# Patient Record
Sex: Female | Born: 1990 | Race: Black or African American | Hispanic: No | Smoking: Never smoker
Health system: Southern US, Community
[De-identification: ages and names within clinical notes are randomized; demographics above are authoritative.]

## PROBLEM LIST (undated history)

## (undated) DIAGNOSIS — E079 Disorder of thyroid, unspecified: Secondary | ICD-10-CM

## (undated) DIAGNOSIS — E119 Type 2 diabetes mellitus without complications: Secondary | ICD-10-CM

## (undated) DIAGNOSIS — Z803 Family history of malignant neoplasm of breast: Secondary | ICD-10-CM

## (undated) HISTORY — DX: Family history of malignant neoplasm of breast: Z80.3

---

## 2011-03-14 NOTE — L&D Delivery Note (Signed)
Delivery Note At approximately 1205 pt reported pelvic pressure, RN noted BBOW at introitus- Dr. Penne Lash and I were called to room at which time pt began pushing.  SROM forebag large amount clear fluid with maternal pushing efforts, and at 12:10 PM a living peri-viable female was delivered via  (Presentation: OA ) by Dr. Penne Lash. Cord clamped x 2, cut, and infant taken to warmer for resuscitative efforts by awaiting NICU team.  APGAR: 4/6/7 at 1,5, and 10 min respectively; weight pending at time of note. Infant transported to NICU for continued care.  Placenta status: Delivered spontaneously, intact w/ maternal pushing efforts after pitocin injected directly into the umbilical cord by Dr. Penne Lash. Approximately 3x4cm marginal retroplacental clot noted.  Cord:  3VC, marginal insertion.  Anesthesia:  Epidural Episiotomy: n/a Lacerations: Intact Suture Repair: n/a Est. Blood Loss (mL): 400  Mom to Women's unit.  Baby to NICU. Plans to pump, undecided about contraception.  Marge Duncans 01/27/2012, 12:40 PM

## 2012-01-27 ENCOUNTER — Encounter (HOSPITAL_COMMUNITY): Payer: Self-pay | Admitting: Anesthesiology

## 2012-01-27 ENCOUNTER — Inpatient Hospital Stay (HOSPITAL_COMMUNITY): Payer: Medicaid Other

## 2012-01-27 ENCOUNTER — Inpatient Hospital Stay (HOSPITAL_COMMUNITY): Payer: Medicaid Other | Admitting: Anesthesiology

## 2012-01-27 ENCOUNTER — Encounter (HOSPITAL_COMMUNITY): Payer: Self-pay | Admitting: Emergency Medicine

## 2012-01-27 ENCOUNTER — Inpatient Hospital Stay (HOSPITAL_COMMUNITY)
Admission: EM | Admit: 2012-01-27 | Discharge: 2012-01-29 | DRG: 774 | Disposition: A | Discharge status: Other Acute Inpatient Hospital | Payer: Medicaid Other | Attending: Emergency Medicine | Admitting: Emergency Medicine

## 2012-01-27 DIAGNOSIS — O4690 Antepartum hemorrhage, unspecified, unspecified trimester: Secondary | ICD-10-CM | POA: Diagnosis not present

## 2012-01-27 DIAGNOSIS — O4290 Premature rupture of membranes, unspecified as to length of time between rupture and onset of labor, unspecified weeks of gestation: Secondary | ICD-10-CM

## 2012-01-27 DIAGNOSIS — I10 Essential (primary) hypertension: Secondary | ICD-10-CM

## 2012-01-27 DIAGNOSIS — N939 Abnormal uterine and vaginal bleeding, unspecified: Secondary | ICD-10-CM

## 2012-01-27 LAB — URINALYSIS, MICROSCOPIC ONLY
Leukocytes, UA: NEGATIVE
Nitrite: NEGATIVE
Specific Gravity, Urine: 1.01 (ref 1.005–1.030)
Urobilinogen, UA: 0.2 mg/dL (ref 0.0–1.0)
pH: 6.5 (ref 5.0–8.0)

## 2012-01-27 LAB — DIFFERENTIAL
Lymphs Abs: 1.9 10*3/uL (ref 0.7–4.0)
Monocytes Absolute: 1.3 10*3/uL — ABNORMAL HIGH (ref 0.1–1.0)
Monocytes Relative: 9 % (ref 3–12)
Neutro Abs: 11 10*3/uL — ABNORMAL HIGH (ref 1.7–7.7)
Neutrophils Relative %: 78 % — ABNORMAL HIGH (ref 43–77)

## 2012-01-27 LAB — CBC WITH DIFFERENTIAL/PLATELET
Eosinophils Absolute: 0.1 10*3/uL (ref 0.0–0.7)
Eosinophils Relative: 1 % (ref 0–5)
HCT: 34.3 % — ABNORMAL LOW (ref 36.0–46.0)
Hemoglobin: 11.7 g/dL — ABNORMAL LOW (ref 12.0–15.0)
Lymphs Abs: 2.5 10*3/uL (ref 0.7–4.0)
MCH: 27.8 pg (ref 26.0–34.0)
MCV: 81.5 fL (ref 78.0–100.0)
Monocytes Absolute: 1.4 10*3/uL — ABNORMAL HIGH (ref 0.1–1.0)
Monocytes Relative: 12 % (ref 3–12)
Platelets: 298 10*3/uL (ref 150–400)
RBC: 4.21 MIL/uL (ref 3.87–5.11)

## 2012-01-27 LAB — CBC
HCT: 34.3 % — ABNORMAL LOW (ref 36.0–46.0)
Hemoglobin: 11.7 g/dL — ABNORMAL LOW (ref 12.0–15.0)
RBC: 4.16 MIL/uL (ref 3.87–5.11)

## 2012-01-27 LAB — OB RESULTS CONSOLE GBS: GBS: POSITIVE

## 2012-01-27 LAB — BASIC METABOLIC PANEL
BUN: 7 mg/dL (ref 6–23)
CO2: 25 mEq/L (ref 19–32)
Calcium: 9.6 mg/dL (ref 8.4–10.5)
Creatinine, Ser: 0.68 mg/dL (ref 0.50–1.10)
GFR calc non Af Amer: >90 mL/min (ref >90)
Glucose, Bld: 98 mg/dL (ref 70–99)

## 2012-01-27 LAB — WET PREP, GENITAL: Clue Cells Wet Prep HPF POC: NONE SEEN

## 2012-01-27 LAB — PROTIME-INR: INR: 1 (ref 0.00–1.49)

## 2012-01-27 LAB — APTT: aPTT: 29 seconds (ref 24–37)

## 2012-01-27 LAB — GROUP B STREP BY PCR: Group B strep by PCR: POSITIVE — AB

## 2012-01-27 LAB — RUBELLA SCREEN: Rubella: 121.9 IU/mL — ABNORMAL HIGH

## 2012-01-27 LAB — RPR: RPR Ser Ql: NONREACTIVE

## 2012-01-27 LAB — ABO/RH: ABO/RH(D): B POS

## 2012-01-27 MED ORDER — OXYCODONE-ACETAMINOPHEN 5-325 MG PO TABS
1.0000 | ORAL_TABLET | ORAL | Status: DC | PRN
  Filled 2012-01-27: qty 2

## 2012-01-27 MED ORDER — PHENYLEPHRINE 40 MCG/ML (10ML) SYRINGE FOR IV PUSH (FOR BLOOD PRESSURE SUPPORT): 80.0000 ug | PREFILLED_SYRINGE | INTRAVENOUS | Status: DC | PRN

## 2012-01-27 MED ORDER — PRENATAL MULTIVITAMIN CH: 1.0000 | ORAL_TABLET | Freq: Every day | ORAL | Status: DC

## 2012-01-27 MED ORDER — DIPHENHYDRAMINE HCL 50 MG/ML IJ SOLN: 12.5000 mg | INTRAMUSCULAR | Status: DC | PRN

## 2012-01-27 MED ORDER — FENTANYL CITRATE 0.05 MG/ML IJ SOLN: 100.0000 ug | INTRAMUSCULAR | Status: DC | PRN

## 2012-01-27 MED ORDER — DIBUCAINE 1 % RE OINT: 1.0000 "application " | TOPICAL_OINTMENT | RECTAL | Status: DC | PRN

## 2012-01-27 MED ORDER — SODIUM CHLORIDE 0.9 % IV SOLN: 250.0000 mL | INTRAVENOUS | Status: DC | PRN

## 2012-01-27 MED ORDER — IBUPROFEN 600 MG PO TABS
600.0000 mg | ORAL_TABLET | Freq: Four times a day (QID) | ORAL | Status: DC
  Administered 2012-01-27 – 2012-01-29 (×8): 600 mg via ORAL
  Filled 2012-01-27 (×10): qty 1

## 2012-01-27 MED ORDER — ZOLPIDEM TARTRATE 5 MG PO TABS: 5.0000 mg | ORAL_TABLET | Freq: Every evening | ORAL | Status: DC | PRN

## 2012-01-27 MED ORDER — EPHEDRINE 5 MG/ML INJ: 10.0000 mg | INTRAVENOUS | Status: DC | PRN

## 2012-01-27 MED ORDER — LACTATED RINGERS IV SOLN
INTRAVENOUS | Status: DC
  Administered 2012-01-27: 08:00:00 via INTRAVENOUS

## 2012-01-27 MED ORDER — EPHEDRINE 5 MG/ML INJ
10.0000 mg | INTRAVENOUS | Status: DC | PRN
  Filled 2012-01-27: qty 4

## 2012-01-27 MED ORDER — AMOXICILLIN 500 MG PO CAPS: 500.0000 mg | ORAL_CAPSULE | Freq: Three times a day (TID) | ORAL | Status: DC

## 2012-01-27 MED ORDER — FLEET ENEMA 7-19 GM/118ML RE ENEM: 1.0000 | ENEMA | Freq: Every day | RECTAL | Status: DC | PRN

## 2012-01-27 MED ORDER — ONDANSETRON HCL 4 MG PO TABS: 4.0000 mg | ORAL_TABLET | ORAL | Status: DC | PRN

## 2012-01-27 MED ORDER — MEASLES, MUMPS & RUBELLA VAC ~~LOC~~ INJ: 0.5000 mL | INJECTION | Freq: Once | SUBCUTANEOUS | Status: DC

## 2012-01-27 MED ORDER — FENTANYL 2.5 MCG/ML BUPIVACAINE 1/10 % EPIDURAL INFUSION (WH - ANES)
14.0000 mL/h | INTRAMUSCULAR | Status: DC
  Administered 2012-01-27: 14 mL/h via EPIDURAL
  Filled 2012-01-27: qty 125

## 2012-01-27 MED ORDER — CALCIUM CARBONATE ANTACID 500 MG PO CHEW: 2.0000 | CHEWABLE_TABLET | ORAL | Status: DC | PRN

## 2012-01-27 MED ORDER — AMPICILLIN SODIUM 2 G IJ SOLR
2.0000 g | Freq: Four times a day (QID) | INTRAMUSCULAR | Status: DC
  Filled 2012-01-27 (×4): qty 2000

## 2012-01-27 MED ORDER — SODIUM CHLORIDE 0.9 % IJ SOLN: 3.0000 mL | INTRAMUSCULAR | Status: DC | PRN

## 2012-01-27 MED ORDER — ACETAMINOPHEN 325 MG PO TABS: 650.0000 mg | ORAL_TABLET | ORAL | Status: DC | PRN

## 2012-01-27 MED ORDER — LIDOCAINE HCL (PF) 1 % IJ SOLN
INTRAMUSCULAR | Status: DC | PRN
  Administered 2012-01-27 (×3): 4 mL

## 2012-01-27 MED ORDER — OXYTOCIN 40 UNITS IN LACTATED RINGERS INFUSION - SIMPLE MED: 62.5000 mL/h | INTRAVENOUS | Status: DC | PRN

## 2012-01-27 MED ORDER — OXYTOCIN 10 UNIT/ML IJ SOLN
INTRAMUSCULAR | Status: AC
  Administered 2012-01-27: 10 [IU]
  Filled 2012-01-27: qty 1

## 2012-01-27 MED ORDER — SODIUM CHLORIDE 0.9 % IJ SOLN: 3.0000 mL | Freq: Two times a day (BID) | INTRAMUSCULAR | Status: DC

## 2012-01-27 MED ORDER — TETANUS-DIPHTH-ACELL PERTUSSIS 5-2.5-18.5 LF-MCG/0.5 IM SUSP
0.5000 mL | Freq: Once | INTRAMUSCULAR | Status: AC
  Administered 2012-01-28: 0.5 mL via INTRAMUSCULAR
  Filled 2012-01-27: qty 0.5

## 2012-01-27 MED ORDER — PRENATAL MULTIVITAMIN CH
1.0000 | ORAL_TABLET | Freq: Every day | ORAL | Status: DC
  Administered 2012-01-28 – 2012-01-29 (×2): 1 via ORAL
  Filled 2012-01-27 (×2): qty 1

## 2012-01-27 MED ORDER — DOCUSATE SODIUM 100 MG PO CAPS: 100.0000 mg | ORAL_CAPSULE | Freq: Every day | ORAL | Status: DC

## 2012-01-27 MED ORDER — MAGNESIUM SULFATE 40 G IN LACTATED RINGERS - SIMPLE
2.0000 g/h | INTRAVENOUS | Status: DC
  Filled 2012-01-27: qty 500

## 2012-01-27 MED ORDER — DIPHENHYDRAMINE HCL 25 MG PO CAPS: 25.0000 mg | ORAL_CAPSULE | Freq: Four times a day (QID) | ORAL | Status: DC | PRN

## 2012-01-27 MED ORDER — TERBUTALINE SULFATE 1 MG/ML IJ SOLN: 0.2500 mg | Freq: Once | INTRAMUSCULAR | Status: DC | PRN

## 2012-01-27 MED ORDER — ERYTHROMYCIN BASE 250 MG PO TABS: 250.0000 mg | ORAL_TABLET | Freq: Four times a day (QID) | ORAL | Status: DC

## 2012-01-27 MED ORDER — ONDANSETRON HCL 4 MG/2ML IJ SOLN: 4.0000 mg | INTRAMUSCULAR | Status: DC | PRN

## 2012-01-27 MED ORDER — BISACODYL 10 MG RE SUPP: 10.0000 mg | Freq: Every day | RECTAL | Status: DC | PRN

## 2012-01-27 MED ORDER — BETAMETHASONE SOD PHOS & ACET 6 (3-3) MG/ML IJ SUSP
12.0000 mg | INTRAMUSCULAR | Status: DC
  Administered 2012-01-27: 12 mg via INTRAMUSCULAR
  Filled 2012-01-27 (×2): qty 2

## 2012-01-27 MED ORDER — BENZOCAINE-MENTHOL 20-0.5 % EX AERO: 1.0000 "application " | INHALATION_SPRAY | CUTANEOUS | Status: DC | PRN

## 2012-01-27 MED ORDER — PHENYLEPHRINE 40 MCG/ML (10ML) SYRINGE FOR IV PUSH (FOR BLOOD PRESSURE SUPPORT)
80.0000 ug | PREFILLED_SYRINGE | INTRAVENOUS | Status: DC | PRN
  Filled 2012-01-27: qty 5

## 2012-01-27 MED ORDER — SENNOSIDES-DOCUSATE SODIUM 8.6-50 MG PO TABS
2.0000 | ORAL_TABLET | Freq: Every day | ORAL | Status: DC
  Administered 2012-01-27 – 2012-01-28 (×2): 2 via ORAL

## 2012-01-27 MED ORDER — SODIUM CHLORIDE 0.9 % IV SOLN
2.0000 g | Freq: Once | INTRAVENOUS | Status: AC
  Administered 2012-01-27: 2 g via INTRAVENOUS
  Filled 2012-01-27: qty 2000

## 2012-01-27 MED ORDER — SODIUM CHLORIDE 0.9 % IV SOLN
250.0000 mg | Freq: Four times a day (QID) | INTRAVENOUS | Status: DC
  Filled 2012-01-27 (×4): qty 250

## 2012-01-27 MED ORDER — WITCH HAZEL-GLYCERIN EX PADS: 1.0000 "application " | MEDICATED_PAD | CUTANEOUS | Status: DC | PRN

## 2012-01-27 MED ORDER — MAGNESIUM SULFATE BOLUS VIA INFUSION
4.0000 g | Freq: Once | INTRAVENOUS | Status: AC
  Administered 2012-01-27: 4 g via INTRAVENOUS
  Filled 2012-01-27: qty 500

## 2012-01-27 MED ORDER — LACTATED RINGERS IV SOLN: 500.0000 mL | Freq: Once | INTRAVENOUS | Status: DC

## 2012-01-27 MED ORDER — OXYTOCIN 40 UNITS IN LACTATED RINGERS INFUSION - SIMPLE MED
1.0000 m[IU]/min | INTRAVENOUS | Status: DC
  Administered 2012-01-27: 2 m[IU]/min via INTRAVENOUS
  Filled 2012-01-27: qty 1000

## 2012-01-27 MED ORDER — LANOLIN HYDROUS EX OINT: TOPICAL_OINTMENT | CUTANEOUS | Status: DC | PRN

## 2012-01-27 MED ORDER — SIMETHICONE 80 MG PO CHEW: 80.0000 mg | CHEWABLE_TABLET | ORAL | Status: DC | PRN

## 2012-01-27 NOTE — Anesthesia Preprocedure Evaluation (Signed)
Anesthesia Evaluation  Patient identified by MRN, date of birth, ID band Patient awake    Reviewed: Allergy & Precautions, H&P , NPO status , Patient's Chart, lab work & pertinent test results, reviewed documented beta blocker date and time   History of Anesthesia Complications Negative for: history of anesthetic complications  Airway Mallampati: I TM Distance: >3 FB Neck ROM: full    Dental  (+) Teeth Intact   Pulmonary neg pulmonary ROS,  breath sounds clear to auscultation        Cardiovascular negative cardio ROS  Rhythm:regular Rate:Normal     Neuro/Psych negative neurological ROS  negative psych ROS   GI/Hepatic negative GI ROS, Neg liver ROS,   Endo/Other  Morbid obesity  Renal/GU negative Renal ROS     Musculoskeletal   Abdominal   Peds  Hematology negative hematology ROS (+)   Anesthesia Other Findings   Reproductive/Obstetrics (+) Pregnancy (23 weeks, PPROM, abruption)                           Anesthesia Physical Anesthesia Plan  ASA: III  Anesthesia Plan: Epidural   Post-op Pain Management:    Induction:   Airway Management Planned:   Additional Equipment:   Intra-op Plan:   Post-operative Plan:   Informed Consent: I have reviewed the patients History and Physical, chart, labs and discussed the procedure including the risks, benefits and alternatives for the proposed anesthesia with the patient or authorized representative who has indicated his/her understanding and acceptance.     Plan Discussed with:   Anesthesia Plan Comments:         Anesthesia Quick Evaluation

## 2012-01-27 NOTE — Anesthesia Procedure Notes (Signed)
Epidural Patient location during procedure: OB Start time: 01/27/2012 11:21 AM  Staffing Performed by: anesthesiologist   Preanesthetic Checklist Completed: patient identified, site marked, surgical consent, pre-op evaluation, timeout performed, IV checked, risks and benefits discussed and monitors and equipment checked  Epidural Patient position: sitting Prep: site prepped and draped and DuraPrep Patient monitoring: continuous pulse ox and blood pressure Approach: midline Injection technique: LOR air  Needle:  Needle type: Tuohy  Needle gauge: 17 G Needle length: 9 cm and 9 Needle insertion depth: 8 cm Catheter type: closed end flexible Catheter size: 19 Gauge Catheter at skin depth: 14 cm Test dose: negative  Assessment Events: blood not aspirated, injection not painful, no injection resistance, negative IV test and no paresthesia  Additional Notes Discussed risk of headache, infection, bleeding, nerve injury and failed or incomplete block.  Patient voices understanding and wishes to proceed. Reason for block:procedure for pain

## 2012-01-27 NOTE — Consult Note (Signed)
Neonatology Consult to Antenatal Patient:  [name] is admitted this morning at 23 2/[redacted] weeks GA with a bulging amniotic sac and some uterine contractions.  She is getting BMZ, magnesium sulfate, and IV Ampicillin. Ultrasound shows an abruption and MFM is recommending induction.  I spoke with the patient and the father of the baby. We discussed the worst case of delivery in the next 1-2 days, including usual DR management, respiratory complications and need for support, IV access, LOS, Mortality and Morbidity, and long term outcomes. Given the very poor prognosis at this GA, especially without time for Betamethasone to have effect, the parents, in consultation with Dr. Penne Lash, may decide to forgo resuscitation attempts. However, if they opt to attempt resuscitation, I have advised them of the possibility that the baby may not respond to resuscitative efforts. Still, we will resuscitate if the parents request it. The couple did not have any questions at this time. We would be glad to come back if they has more questions later.  Thank you for asking me to see this patient.  Deatra James, MD Neonatologist  Time spent: 20 min

## 2012-01-27 NOTE — ED Notes (Signed)
Pt started having a cramping yesterday at 1600. Pt then started bleeding this morning

## 2012-01-27 NOTE — ED Notes (Signed)
Ob rapid response called  

## 2012-01-27 NOTE — ED Notes (Signed)
Pt transferred to [facility] via [facility]. Pt alert and oriented upon discharge from ED

## 2012-01-27 NOTE — Progress Notes (Signed)
Patient ID: Katie [name], female   DOB: Sep 20, 1990, 21 y.o.   MRN: [id number]   Subjective:  Called to bedside for heavy vaginal bleeding pooling on the bed.  Pt not complaining of abdominal pain, lightheadedness  Objective: Filed Vitals:   01/27/12 0922 01/27/12 0931 01/27/12 0942 01/27/12 0956  BP: 115/49 127/63 121/66 160/71  Pulse: 103 101 107 108  Temp:      TempSrc:      Resp:      Height:      Weight:      SpO2:        Abdomen:  Soft, NT Vagina / Cervix:  Clots and blood in bed (approx 200 cc); and continues to trickle.  No presenting part; Bag bulging. Ext:  NT  Rpt bedside US:  Vertex, no clot  MFM consult done via phone  Given amount of bleeding and painful contractions delivery is probably close.  Also, full course of Betamethasone has not been received so likelihood of infant surviving is low.  NICU has given option of not resuscitating.  Discussed option of inducing and c/s fetal distress.  After long conversations about future fertility, cerclage with next pregnancy, risk of cesarean section, patient has decided to augment labor but not do cesarean section due to risks for her.  Patient would like an epidural now.  Pt would like NICU to be present for resuscitation.

## 2012-01-27 NOTE — ED Provider Notes (Signed)
History     CSN: [id number]  Arrival date & time 01/27/12  0500   First MD Initiated Contact with Patient 01/27/12 [phone]      Chief Complaint  Patient presents with  . Vaginal Bleeding  . Abdominal Pain    (Consider location/radiation/quality/duration/timing/severity/associated sxs/prior treatment) HPI Comments: 21 yeatr old female who presents with vaginal bleeding. She is G1 P0 at [redacted] weeks gestation with an uncomplicated pregnancy. She states that approximately 13 hours ago she started to have abdominal cramping which she felt was similar to menstrual cramps and approximately several hours ago started to have vaginal bleeding which was full clots, bright red blood and was brisk. Her symptoms are persistent, nothing makes better or worse, not associated with diarrhea, dysuria, fevers or chills. She has had her normal OB/GYN care in [facility].  Patient is a 21 y.o. female presenting with vaginal bleeding and abdominal pain. The history is provided by the patient.  Vaginal Bleeding Associated symptoms include abdominal pain.  Abdominal Pain The primary symptoms of the illness include abdominal pain and vaginal bleeding.    History reviewed. No pertinent past medical history.  History reviewed. No pertinent past surgical history.  History reviewed. No pertinent family history.  History  Substance Use Topics  . Smoking status: Not on file  . Smokeless tobacco: Not on file  . Alcohol Use: Not on file    OB History    Grav Para Term Preterm Abortions TAB SAB Ect Mult Living                  Review of Systems  Gastrointestinal: Positive for abdominal pain.  Genitourinary: Positive for vaginal bleeding.  All other systems reviewed and are negative.    Allergies  Review of patient's allergies indicates no known allergies.  Home Medications   Current Outpatient Rx  Name  Route  Sig  Dispense  Refill  . PRENATAL MULTIVITAMIN CH   Oral   Take 1  tablet by mouth daily.           BP 158/80  Pulse 108  Temp 98.6 F (37 C) (Oral)  Resp 20  Ht 5\' 2"  (1.575 m)  Wt 247 lb (112.038 kg)  BMI 45.18 kg/m2  SpO2 100%  LMP 08/27/2011  Physical Exam  Nursing note and vitals reviewed. Constitutional: She appears well-developed and well-nourished. No distress.  HENT:  Head: Normocephalic and atraumatic.  Mouth/Throat: Oropharynx is clear and moist. No oropharyngeal exudate.  Eyes: Conjunctivae normal and EOM are normal. Pupils are equal, round, and reactive to light. Right eye exhibits no discharge. Left eye exhibits no discharge. No scleral icterus.  Neck: Normal range of motion. Neck supple. No JVD present. No thyromegaly present.  Cardiovascular: Regular rhythm, normal heart sounds and intact distal pulses.  Exam reveals no gallop and no friction rub.   No murmur heard.      Mild tachycardia  Pulmonary/Chest: Effort normal and breath sounds normal. No respiratory distress. She has no wheezes. She has no rales.  Abdominal: Soft. Bowel sounds are normal. She exhibits no distension and no mass. There is tenderness (mild tenderness to palpation in the suprapubic area).  Genitourinary:       Bimanual exam with chaperone present shows large amount of bright red blood, closed normal appearing cervix, no tenderness  Musculoskeletal: Normal range of motion. She exhibits no edema and no tenderness.  Lymphadenopathy:    She has no cervical adenopathy.  Neurological: She is alert. Coordination  normal.  Skin: Skin is warm and dry. No rash noted. No erythema.  Psychiatric: She has a normal mood and affect. Her behavior is normal.    ED Course  Procedures (including critical care time)   Labs Reviewed  CBC WITH DIFFERENTIAL  BASIC METABOLIC PANEL  ABO/RH   No results found.   1. Vaginal bleeding   2. Hypertension       MDM  Overall the patient appears well though she does have lower abdominal mild tenderness and a large amount  of vaginal bleeding. Concern for presence of placenta near cervix, she needs OB/GYN evaluation, I have discussed her care with Dr. Emelda Fear who has accepted transfer of the [facility] the [facility] hospital. Transfer orders made, blood pressure slightly elevated, mild tachycardia on vital signs, no hypotension. No seizures.        Vida Roller, MD 01/27/12 [phone]

## 2012-01-27 NOTE — MAU Note (Signed)
Dr Thad Ranger called to come evaluate pt. Will come as soon as poss

## 2012-01-27 NOTE — H&P (Signed)
Katie [name] is a 21 y.o. female presenting for cramping, bleeding  Pt is a 21 y.o. G1P0 at [redacted]w[redacted]d by LMP with cramping lower abdominal pain starting 14 hours ago, intermittent, every 2 to 3 minutes. Pain 8/10. Noticed bleeding in toilet - small clot - early this AM, pink/red on toilet paper and then dripping of blood into toilet. Has had one pad half-saturated in MAU. Transferred from [facility]   Maternal Medical History:  Reason for admission: Reason for admission: contractions and vaginal bleeding.  Reason for Admission:   nauseaContractions: Onset was 13-24 hours ago.   Frequency: regular.   Perceived severity is moderate.    Prenatal Complications - Diabetes: none.    OB History    Grav Para Term Preterm Abortions TAB SAB Ect Mult Living   1              History reviewed. No pertinent past medical history. History reviewed. No pertinent past surgical history. Family History: family history is not on file. Social History:  does not have a smoking history on file. She does not have any smokeless tobacco history on file. Her alcohol and drug histories not on file.   Prenatal Transfer Tool  Maternal Diabetes: No Genetic Screening: Unknown Maternal Ultrasounds/Referrals: Normal Fetal Ultrasounds or other Referrals:  None Maternal Substance Abuse:  No Significant Maternal Medications:  None Significant Maternal Lab Results:  None Other Comments:  Patient gets care at [facility] in [place]. No records available yet.  Review of Systems  Constitutional: Negative for fever and chills.  Eyes: Negative for blurred vision and double vision.  Gastrointestinal: Positive for abdominal pain. Negative for nausea and vomiting.  Genitourinary: Negative for dysuria and urgency.  Neurological: Negative for dizziness and headaches.      Blood pressure 130/63, pulse 80, temperature 99 F (37.2 C), temperature source Oral, resp. rate 20, height 5\' 2"  (1.575 m), weight 112.038 kg (247 lb),  last menstrual period 08/27/2011, SpO2 100.00%. Maternal Exam:  Cervix: Cervix evaluated by sterile speculum exam.     Physical Exam  Constitutional: She is oriented to person, place, and time. She appears well-developed and well-nourished. She appears distressed (anxious).  HENT:  Head: Normocephalic and atraumatic.  Eyes: Conjunctivae normal and EOM are normal.  Neck: Normal range of motion. Neck supple.  Cardiovascular: Normal rate, regular rhythm and normal heart sounds.   Respiratory: Effort normal and breath sounds normal. No respiratory distress.  GI: Soft. There is no tenderness. There is no rebound and no guarding.  Genitourinary:       Normal external genitalia with blood around introitus and on pad. Vagina normal, some thin blood-tinged fluid. Membranes visible at os, looks about 5 cm visually. No pelvic/digital exam done.  Musculoskeletal: Normal range of motion. She exhibits no edema and no tenderness.  Neurological: She is alert and oriented to person, place, and time.  Skin: Skin is warm and dry.  Psychiatric: She has a normal mood and affect.    Results for orders placed during the hospital encounter of 01/27/12 (from the past 24 hour(s))  CBC WITH DIFFERENTIAL     Status: Abnormal   Collection Time   01/27/12  5:17 AM      Component Value Range   WBC 12.1 (*) 4.0 - 10.5 K/uL   RBC 4.21  3.87 - 5.11 MIL/uL   Hemoglobin 11.7 (*) 12.0 - 15.0 g/dL   HCT 78.2 (*) 95.6 - 21.3 %   MCV 81.5  78.0 - 100.0 fL  MCH 27.8  26.0 - 34.0 pg   MCHC 34.1  30.0 - 36.0 g/dL   RDW 09.8  11.9 - 14.7 %   Platelets 298  150 - 400 K/uL   Neutrophils Relative 67  43 - 77 %   Neutro Abs 8.1 (*) 1.7 - 7.7 K/uL   Lymphocytes Relative 21  12 - 46 %   Lymphs Abs 2.5  0.7 - 4.0 K/uL   Monocytes Relative 12  3 - 12 %   Monocytes Absolute 1.4 (*) 0.1 - 1.0 K/uL   Eosinophils Relative 1  0 - 5 %   Eosinophils Absolute 0.1  0.0 - 0.7 K/uL   Basophils Relative 0  0 - 1 %   Basophils Absolute  0.0  0.0 - 0.1 K/uL  BASIC METABOLIC PANEL     Status: Normal   Collection Time   01/27/12  5:17 AM      Component Value Range   Sodium 136  135 - 145 mEq/L   Potassium 3.7  3.5 - 5.1 mEq/L   Chloride 103  96 - 112 mEq/L   CO2 25  19 - 32 mEq/L   Glucose, Bld 98  70 - 99 mg/dL   BUN 7  6 - 23 mg/dL   Creatinine, Ser 8.29  0.50 - 1.10 mg/dL   Calcium 9.6  8.4 - 56.2 mg/dL   GFR calc non Af Amer >90  >90 mL/min   GFR calc Af Amer >90  >90 mL/min  ABO/RH     Status: Normal   Collection Time   01/27/12  5:20 AM      Component Value Range   ABO/RH(D) B POS     No rh immune globuloin NOT A RH IMMUNE GLOBULIN CANDIDATE, PT RH POSITIVE    WET PREP, GENITAL     Status: Abnormal   Collection Time   01/27/12  6:50 AM      Component Value Range   Yeast Wet Prep HPF POC NONE SEEN  NONE SEEN   Trich, Wet Prep NONE SEEN  NONE SEEN   Clue Cells Wet Prep HPF POC NONE SEEN  NONE SEEN   WBC, Wet Prep HPF POC FEW (*) NONE SEEN   FHTs:  140, mod variability, accels present, variable decels - some short but 1 down to 60s lasting for 1 minute. Toco:  Not picking up contractions well, but irritability and some subtle ctx q 3-4 min.  Prenatal labs: ABO, Rh: --/--/B POS (11/16 0520) Antibody:   Rubella:   RPR:    HBsAg:    HIV:    GBS:     Assessment/Plan: 21 y.o. G1P0 at [redacted]w[redacted]d by LMP with -  PPROM, preterm contractions/cramping  -  BMZ, latency antibiotics -  Ultrasound, OB labs, GBS, GC/Chla, wet prep -  Admit to antenatal, continuous monitoring - NICU consult. Pt wants to be transferred to [facility] which is closer to family. Will discuss if/when stable. - Discussed with NICU and Dr. Emelda Fear.  [name] 01/27/2012, 7:19 AM

## 2012-01-27 NOTE — MAU Note (Signed)
Pt received per [facility] from [place]-presented there with craming and vaginal bleeding-states gets her care at [facility] in [place]

## 2012-01-28 ENCOUNTER — Encounter (HOSPITAL_COMMUNITY): Payer: Self-pay | Admitting: *Deleted

## 2012-01-28 LAB — URINE CULTURE
Colony Count: NO GROWTH
Culture: NO GROWTH

## 2012-01-28 LAB — CBC
HCT: 30.4 % — ABNORMAL LOW (ref 36.0–46.0)
MCV: 82.2 fL (ref 78.0–100.0)
RDW: 13.6 % (ref 11.5–15.5)
WBC: 27.7 10*3/uL — ABNORMAL HIGH (ref 4.0–10.5)

## 2012-01-28 NOTE — Progress Notes (Signed)
Post Partum Day 1 Subjective: up ad lib, voiding, tolerating PO and + flatus  Objective: Blood pressure 120/67, pulse 70, temperature 97.9 F (36.6 C), temperature source Oral, resp. rate 18, height 5\' 2"  (1.575 m), weight 112.038 kg (247 lb), last menstrual period 08/27/2011, SpO2 100.00%.  Physical Exam:  General: alert, cooperative and no distress Lochia: appropriate, pt states she passed something that looked like tissue this morning, some bad smell Uterine Fundus: firm, non-tender Incision: n/a DVT Evaluation: No evidence of DVT seen on physical exam. Negative Homan's sign. No cords or calf tenderness.   Basename 01/28/12 0506 01/27/12 0734  HGB 10.3* 11.7*  HCT 30.4* 34.3*   Results for orders placed during the hospital encounter of 01/27/12 (from the past 24 hour(s))  CBC     Status: Abnormal   Collection Time   01/28/12  5:06 AM      Component Value Range   WBC 27.7 (*) 4.0 - 10.5 K/uL   RBC 3.70 (*) 3.87 - 5.11 MIL/uL   Hemoglobin 10.3 (*) 12.0 - 15.0 g/dL   HCT 16.1 (*) 09.6 - 04.5 %   MCV 82.2  78.0 - 100.0 fL   MCH 27.8  26.0 - 34.0 pg   MCHC 33.9  30.0 - 36.0 g/dL   RDW 40.9  81.1 - 91.4 %   Platelets 295  150 - 400 K/uL    Assessment/Plan: Plan for discharge tomorrow Baby in NICU Elevated WBC  27.7, afebrile.  Repeat CBC with diff tomorrow.   LOS: 1 day   [name] 01/28/2012, 11:32 AM

## 2012-01-28 NOTE — Clinical Social Work Note (Signed)
Clinical Social Work Department PSYCHOSOCIAL ASSESSMENT - MATERNAL/CHILD 01/28/2012  Patient:  Katie [name], Katie [name]  Account Number:  [id number]  Admit Date:  01/27/2012  Marjo Bicker Name:   Vickki Muff    Clinical Social Worker:  Truman Hayward, LCSW   Date/Time:  01/28/2012 10:00 AM  Date Referred:  01/28/2012   Referral source  RN     Referred reason  NICU   Other referral source:    I:  FAMILY / HOME ENVIRONMENT Child's legal guardian:  PARENT  Guardian - Name Guardian - Age Guardian - Address  Katie [name] [address] [place], [place] [address]  Katie [name]  [address] [place], [place] [address]   Other household support members/support persons Name Relationship DOB  none     Other support:   MOB and FOB report good family support, however most of supportive family lives in [place].    II  PSYCHOSOCIAL DATA Information Source:  Patient Interview  Event organiser Employment:   both MOB and FOB are currently unemployed   Surveyor, quantity resources:  Media planner If [employer] - County:  [employer] / Grade:   Maternity Care Coordinator / Child Services Coordination / Early Interventions:  Cultural issues impacting care:    III  STRENGTHS Strengths  Adequate Resources  Supportive family/friends  Home prepared for Child (including basic supplies)  Compliance with medical plan  Understanding of illness   Strength comment:    IV  RISK FACTORS AND CURRENT PROBLEMS Current Problem:  None   Risk Factor & Current Problem Patient Issue Family Issue Risk Factor / Current Problem Comment   N N     V  SOCIAL WORK ASSESSMENT CSW spoke with MOB and FOB, along with other family in room.  CSW discussed admission to NICU.  MOB and  FOB reported they understood treatment and felt there was good communication with staff.  CSW discussed emotional stability.  MOB and FOB expressed there are no major symptom  concerns.  CSW discussed PPD symptoms and what to look out for with MOB.  CSW discussed supplies and family support.  MOB reported due to early delivery they had several supplies to still get for infant.  CSW instructed MOB to let SW know if any concerns arise.  MOB reported for family support that they plan to transfer infant, once stable, to [place] for further treatment due to more family in [place] area.  CSW discussed infant qualifying for SSI and documented information to begin application process.  CSW will continue to follow to offer support while infant in NICU.      VI SOCIAL WORK PLAN Social Work Plan  Psychosocial Support/Ongoing Assessment of Needs   Type of pt/family education:   information about PPD.   If child protective services report - county:   If child protective services report - date:   Information/referral to community resources comment:   Other social work plan:

## 2012-01-28 NOTE — Anesthesia Postprocedure Evaluation (Signed)
  Anesthesia Post-op Note  Patient: Katie [name]  Procedure(s) Performed: * No procedures listed *  Patient Location: Women's Unit  Anesthesia Type:Epidural  Level of Consciousness: awake, alert  and oriented  Airway and Oxygen Therapy: Patient Spontanous Breathing  Post-op Pain: mild  Post-op Assessment: Patient's Cardiovascular Status Stable, Respiratory Function Stable, Patent Airway, No signs of Nausea or vomiting and Pain level controlled  Post-op Vital Signs: stable  Complications: No apparent anesthesia complications

## 2012-01-29 LAB — GC/CHLAMYDIA PROBE AMP, GENITAL: Chlamydia, DNA Probe: NEGATIVE

## 2012-01-29 LAB — CBC WITH DIFFERENTIAL/PLATELET
Basophils Relative: 0 % (ref 0–1)
Eosinophils Absolute: 0.1 10*3/uL (ref 0.0–0.7)
Hemoglobin: 10.2 g/dL — ABNORMAL LOW (ref 12.0–15.0)
MCH: 27.7 pg (ref 26.0–34.0)
MCHC: 32.9 g/dL (ref 30.0–36.0)
Monocytes Absolute: 1.6 10*3/uL — ABNORMAL HIGH (ref 0.1–1.0)
Monocytes Relative: 9 % (ref 3–12)
Neutrophils Relative %: 65 % (ref 43–77)

## 2012-01-29 MED ORDER — IBUPROFEN 600 MG PO TABS: 600.0000 mg | ORAL_TABLET | Freq: Four times a day (QID) | ORAL | Status: DC

## 2012-01-29 MED ORDER — NORETHINDRONE 0.35 MG PO TABS: ORAL_TABLET | ORAL | Status: DC

## 2012-01-29 NOTE — Progress Notes (Signed)
Ur chart review completed.  

## 2012-01-29 NOTE — Discharge Summary (Signed)
Obstetric Discharge Summary Reason for Admission: onset of labor- pt presented with cramping and passing a clot; cx was visually 5cm dilated with membranes present at approx 7a and proceeded to delivery at approx noon; received one dose BMZ  Prenatal Procedures: none Intrapartum Procedures: spontaneous vaginal delivery Postpartum Procedures: none Complications-Operative and Postpartum: none Hemoglobin  Date Value Range Status  01/29/2012 10.2* 12.0 - 15.0 g/dL Final     HCT  Date Value Range Status  01/29/2012 31.0* 36.0 - 46.0 % Final    Physical Exam:  General: alert, cooperative and no distress Lochia: appropriate Uterine Fundus: firm DVT Evaluation: No evidence of DVT seen on physical exam.  Discharge Diagnoses: IUP at 23.2 wks- delivery  Discharge Information: Date: 01/29/2012 Activity: pelvic rest Diet: routine Medications: PNV and Ibuprofen Condition: stable Instructions: refer to practice specific booklet Discharge to: home Follow-up Information    Follow up with Provider in [place], [place]. (Make appointment for 4-6 weeks postpartum)          Newborn Data: Live born female  Birth Weight: 1 lb 1.3 oz (490 g) APGAR: 4, 6  Infant in NICU. Pt pumping breastmilk.  Pt moving to [place], [place] within the next few weeks- will follow up for postpartum visit there.  Cam Hai 01/29/2012, 7:51 AM

## 2012-01-29 NOTE — Progress Notes (Signed)
CSW met with parents in MOB's third floor room to introduce myself as they initially met with weekend CSW. CSW also assisted them in completing SSI application. Parents were very friendly and state baby is doing well today. MOB explained to CSW that they are having to move out of their apartment because FOB lost his job and they are "behind" financially. They are moving to [place], [place] (near [place]) on Friday. They had planned to transfer PNC to [place] and then the baby came before the move. They appear to be coping well with added stress to the situation. They do not have transportation in order to stay in [place], but CSW informed them of the possibility of staying at [facility]. CSW also offered gas cards if they are traveling from [place], although resources for this are limited. They told CSW that they will let CSW know if assistance is needed. CSW also addressed signs and symptoms of PPD and common emotions related to the NICU experience. Both parents were very attentive and know the importance of asking for help if needed.  

## 2012-01-31 LAB — TYPE AND SCREEN: Unit division: 0

## 2013-12-07 IMAGING — US US OB COMP +14 WK
1 series · 12 of 28 positions shown · non-contrast
Comparison: none

[Series 1: us ob comp +14 wk · 73 acquisitions, 12 frames shown]
[im 3/73]
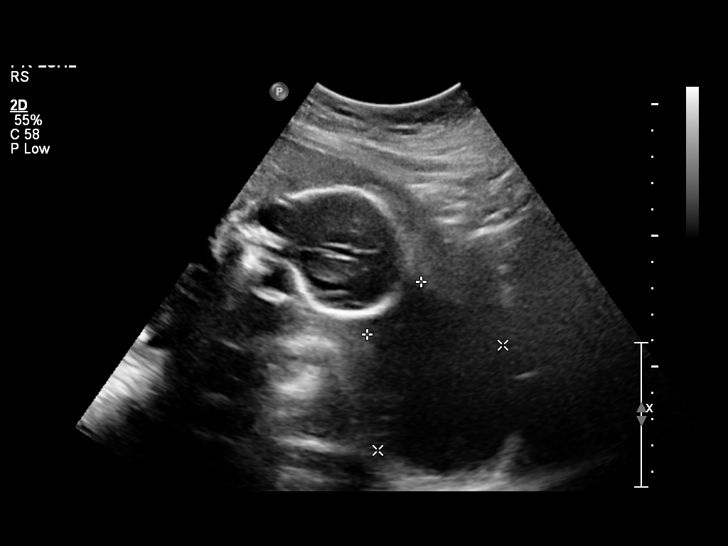
[im 9/73]
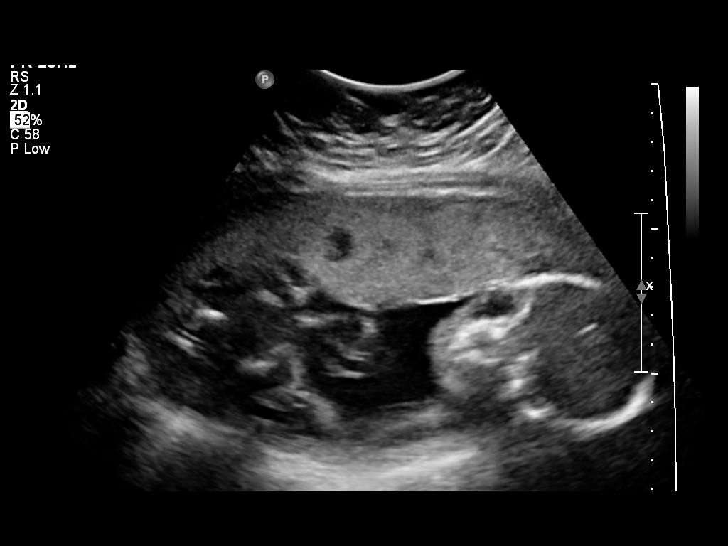
[im 14/73]
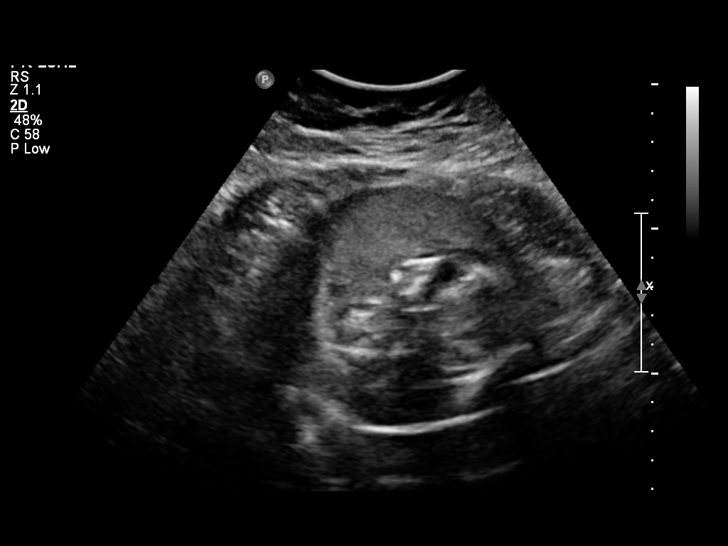
[im 22/73]
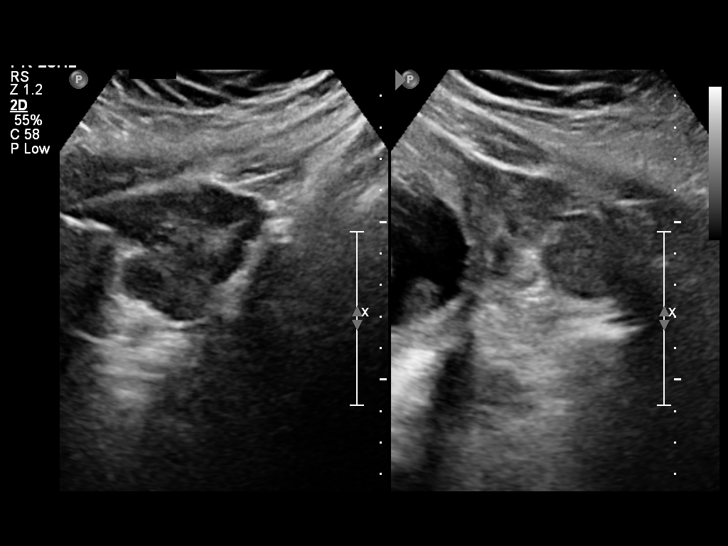
[im 27/73]
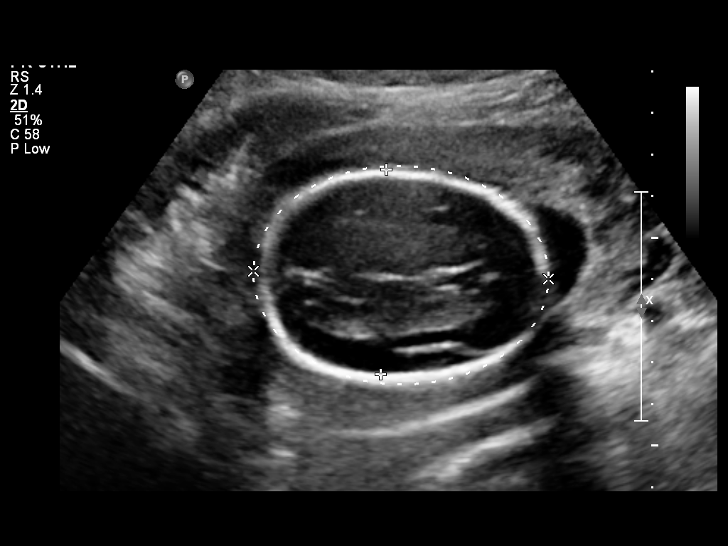
[im 33/73]
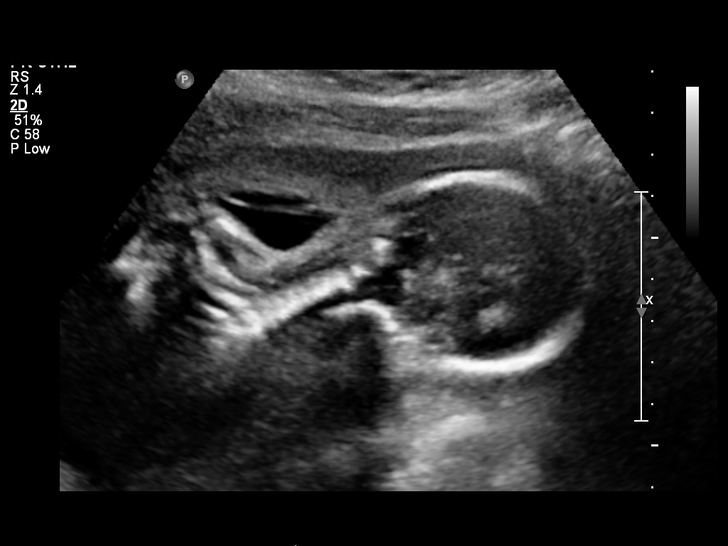
[im 41/73]
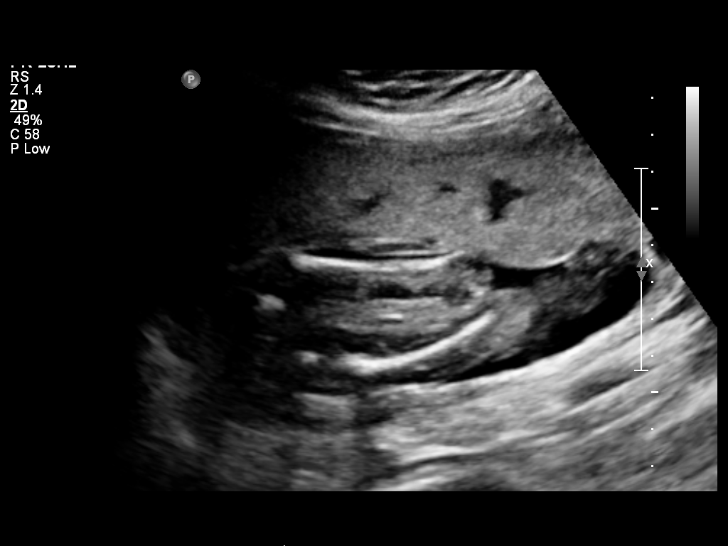
[im 46/73]
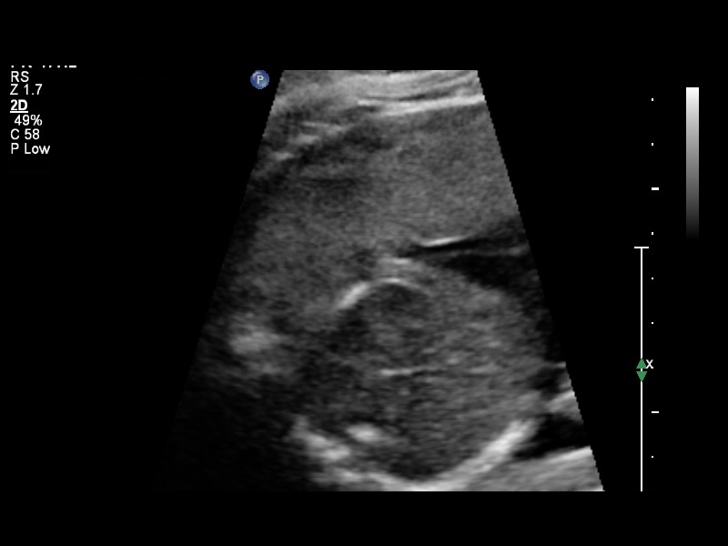
[im 51/73]
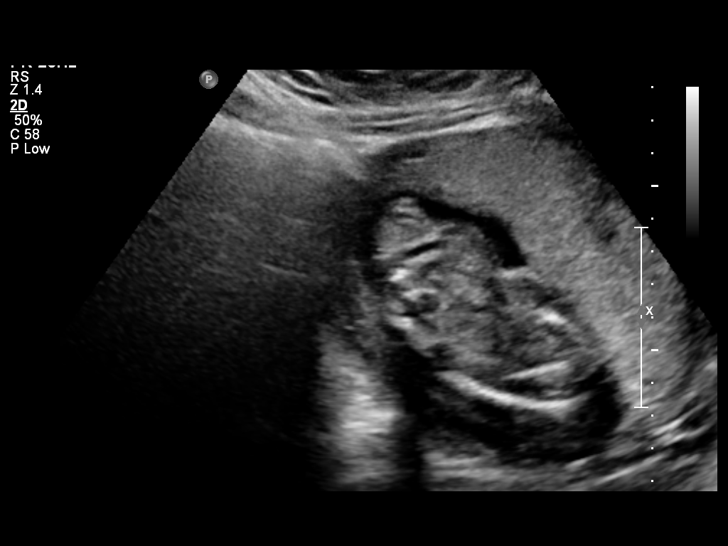
[im 59/73]
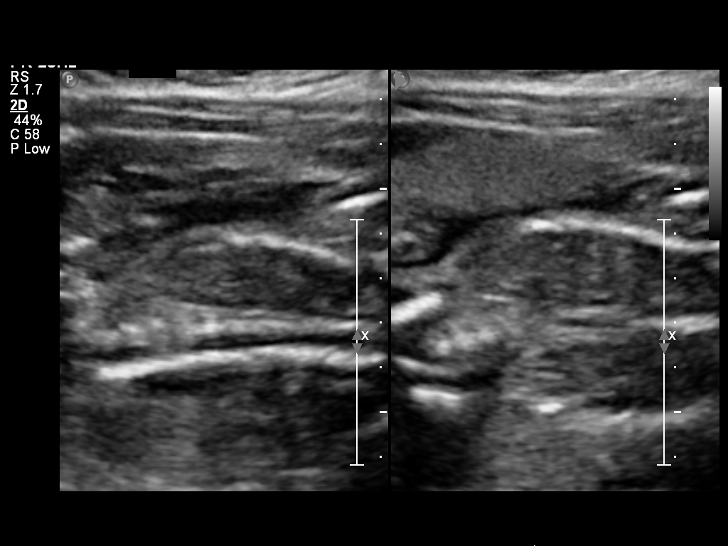
[im 65/73]
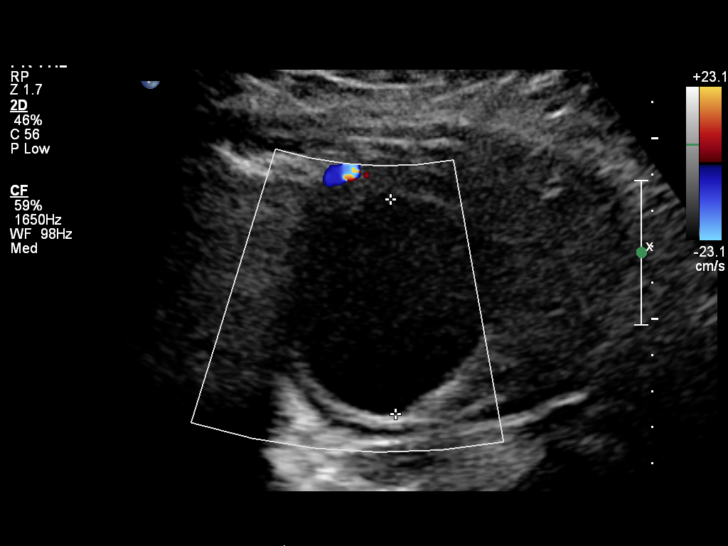
[im 70/73]
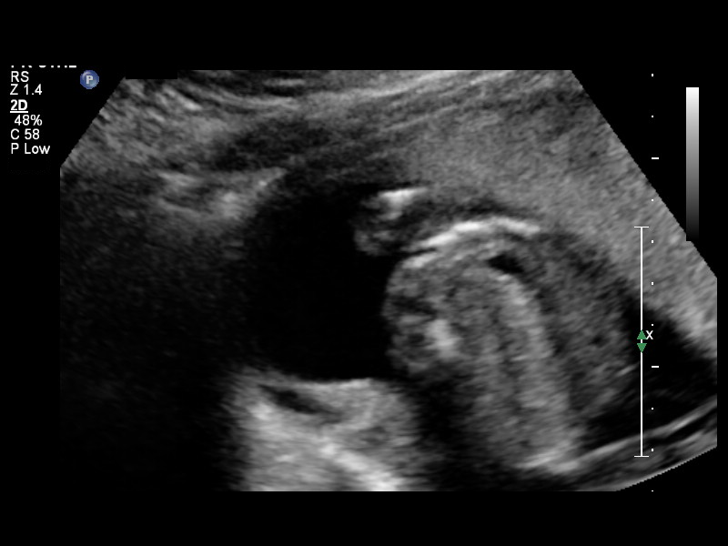

[12 of 28 positions shown; findings below may reference images not displayed]

OBSTETRICS REPORT
                      (Signed Final 01/29/2012 [DATE])

Service(s) Provided

 US OB COMP + 14 WK                                    76805.1
Indications

 Premature rupture of membranes - leaking fluid
 (PPROM)
 Basic anatomic survey
 Vaginal bleeding, unknown etiology
 Evaluate placenta
 Assess fetal growth
 Determine fetal presentation using ultrasound
 Cervical length
Fetal Evaluation

 Num Of Fetuses:    1
 Fetal Heart Rate:  144                         bpm
 Cardiac Activity:  Observed
 Presentation:      Cephalic
 Placenta:          Anterior, above cervical os
 P. Cord            Visualized
 Insertion:

 Amniotic Fluid
 AFI FV:      Subjectively within normal limits
                                             Larg Pckt:     5.9  cm
Biometry

 BPD:     48.7  mm    G. Age:   20w 5d                CI:         64.5   70 - 86
                                                      FL/HC:      20.6   19.2 -

 HC:       195  mm    G. Age:   21w 5d      < 3  %    HC/AC:      1.07   1.05 -

 AC:     182.6  mm    G. Age:   23w 0d       36  %    FL/BPD:     82.3   71 - 87
 FL:      40.1  mm    G. Age:   23w 0d       28  %    FL/AC:      22.0   20 - 24
 CER:     24.8  mm    G. Age:   22w 6d       42  %

 Est. FW:     535  gm      1 lb 3 oz     42  %
Gestational Age

 Clinical EDD:  23w 2d                                        EDD:   05/23/12
 U/S Today:     22w 1d                                        EDD:   05/31/12
 Best:          23w 2d    Det. By:   Clinical EDD             EDD:   05/23/12
Anatomy

 Cranium:          Appears normal         Ductal Arch:      Basic anatomy
                                                            exam per order
 Fetal Cavum:      Appears normal         Diaphragm:        Basic anatomy
                                                            exam per order
 Ventricles:       Appears normal         Stomach:          Appears normal, left
                                                            sided
 Choroid Plexus:   Appears normal         Abdomen:          Appears normal
 Cerebellum:       Appears normal         Abdominal Wall:   Not well visualized
 Posterior Fossa:  Appears normal         Cord Vessels:     2 Vessel Cord
 Nuchal Fold:      Not applicable (>20    Kidneys:          Appear normal
                   wks GA)
 Lips:             Appears normal         Bladder:          Appears normal
 Heart:            Echogenic focus        Spine:            Not well visualized
                   in LV
 RVOT:             Not well visualized    Lower             Visualized
                                          Extremities:
 LVOT:             Not well visualized    Upper             Visualized
                                          Extremities:
 Aortic Arch:      Basic anatomy
                   exam per order

 Other:  Complete fetal anatomic survey previously performed in office.
         Technically difficult due to fetal position.
Targeted Anatomy

 Fetal Central Nervous System
 Lat. Ventricles:  5.8                    Cisterna Magna:
Cervix Uterus Adnexa

 Cervical Length:   0         cm

 Cervix:       Open.  Bulging membranes into the vagina.
               External os appears to be dilated 3.1 cm.

 Left Ovary:   Size(cm) L: 4.31 x W: 2.79 x H: 2.94  Volume(cc):

 Right Ovary:  Size(cm) L: 4.29 x W: 2.68 x H: 2.78  Volume(cc):


 Adnexa:     No abnormality visualized.
Comments

 Gave Isla, RN verbal preliminary at time of Ultrasound.
 Preliminary faxed to L&D unit.
Impression

 Assigned GA is currently 23w 2d by established clinical EDD.
 Concordant fetal biometry on today's exam.
 Open cervix with bulging membranes into vagina.
 Suboptimal visualization of fetal anatomy.  2 vessel cord
 noted.  No other anomalies seen involving visualized
 anatomy.
 Echogenic intracardiac focus noted, which is a soft marker for
 Trisomy 21.
 questions or concerns.

## 2014-01-12 ENCOUNTER — Encounter (HOSPITAL_COMMUNITY): Payer: Self-pay | Admitting: *Deleted

## 2016-10-27 LAB — HM PAP SMEAR: HM Pap smear: NORMAL

## 2017-05-14 ENCOUNTER — Encounter: Payer: Self-pay | Admitting: Physician Assistant

## 2017-05-14 ENCOUNTER — Ambulatory Visit: Payer: Managed Care, Other (non HMO) | Admitting: Physician Assistant

## 2017-05-14 VITALS — BP 124/82 | HR 64 | Temp 98.7°F | Resp 16 | Ht 62.75 in | Wt 244.0 lb

## 2017-05-14 DIAGNOSIS — N912 Amenorrhea, unspecified: Secondary | ICD-10-CM

## 2017-05-14 DIAGNOSIS — E039 Hypothyroidism, unspecified: Secondary | ICD-10-CM | POA: Diagnosis not present

## 2017-05-14 DIAGNOSIS — E1100 Type 2 diabetes mellitus with hyperosmolarity without nonketotic hyperglycemic-hyperosmolar coma (NKHHC): Secondary | ICD-10-CM

## 2017-05-14 DIAGNOSIS — Z6841 Body Mass Index (BMI) 40.0 and over, adult: Secondary | ICD-10-CM

## 2017-05-14 DIAGNOSIS — Z3009 Encounter for other general counseling and advice on contraception: Secondary | ICD-10-CM

## 2017-05-14 NOTE — Progress Notes (Addendum)
Patient: Katie [name] Female    DOB: 01-28-1991   27 y.o.   MRN: [id number] Visit Date: 05/15/2017  Today's Provider: Trey Sailors, PA-C   Chief Complaint  Patient presents with  . Establish Care  . Diabetes   Subjective:    Katie [name] is a 27 y/o woman presenting today to establish care. She was seen by another primary care provider until recently.   She is an early Automotive engineer. She is married and has 26 year old twins, a boy and a girl. She does not smoke or use alcohol.   She has a history of Type II DM. She reports her last A1c was greater than ten. She was placed on Triseba 20 units subQ daily an Victoza, 1.8 mg subQ daily. She reports these were sample medications and she never actually filled these at the pharmacy. She has not had her A1c checked after starting these medications. She had previously been on metformin but experienced intolerable GERD and so was switched off this. She has been using [place] but has ran out of Victoza recently. She has experienced increase hunger and weight gain due to this.   She also uses Nuvaring for contraception. She reports she uses it continuously 4 weeks to avoid having a period. She has been out of this medication and has used the same ring for seven weeks. She is concerned she may be pregnant due to increased appetite and weight gain. She does not have a history of heart attack, blood clot, or stroke. She does not have a history of migraines. She does not smoke. She has DM but no history of vascular disease.   Diabetes  She has type 2 diabetes mellitus. There are no hypoglycemic associated symptoms. Pertinent negatives for diabetes include no blurred vision, no chest pain, no fatigue, no foot paresthesias, no foot ulcerations, no polydipsia, no polyphagia, no polyuria, no visual change, no weakness and no weight loss. There are no hypoglycemic complications. Symptoms are stable. There are no known risk factors for coronary  artery disease. She is following a diabetic diet. She participates in exercise intermittently. An ACE inhibitor/angiotensin II receptor blocker is not being taken. She does not see a podiatrist.Eye exam is not current.       No Known Allergies   Current Outpatient Medications:  .  Insulin Degludec (TRESIBA FLEXTOUCH [place]), Inject 20 Units into the skin daily., Disp: , Rfl:  .  levothyroxine (SYNTHROID, LEVOTHROID) 50 MCG tablet, , Disp: , Rfl:  .  liraglutide (VICTOZA) 18 MG/3ML SOPN, Inject 1.8 mg into the skin daily., Disp: , Rfl:  .  NUVARING 0.12-0.015 MG/24HR vaginal ring, , Disp: , Rfl:  .  Prenatal Vit-Fe Fumarate-FA (PRENATAL MULTIVITAMIN) TABS, Take 1 tablet by mouth daily., Disp: , Rfl:   Review of Systems  Constitutional: Positive for unexpected weight change (Pt report gaining about 10 pounds in about three weeks). Negative for fatigue and weight loss.  Eyes: Negative for blurred vision.  Cardiovascular: Negative for chest pain.  Endocrine: Negative for polydipsia, polyphagia and polyuria.  Neurological: Negative for weakness.    Social History   Tobacco Use  . Smoking status: Never Smoker  . Smokeless tobacco: Never Used  Substance Use Topics  . Alcohol use: No   Objective:   BP 124/82 (BP Location: Right Arm, Patient Position: Sitting, Cuff Size: Large)   Pulse 64   Temp 98.7 F (37.1 C) (Oral)   Resp 16   Ht  5' 2.75" (1.594 m)   Wt 244 lb (110.7 kg)   BMI 43.57 kg/m  Vitals:   05/14/17 1521  BP: 124/82  Pulse: 64  Resp: 16  Temp: 98.7 F (37.1 C)  TempSrc: Oral  Weight: 244 lb (110.7 kg)  Height: 5' 2.75" (1.594 m)     Physical Exam  Constitutional: She is oriented to person, place, and time. She appears well-developed and well-nourished.  Cardiovascular: Normal rate and regular rhythm.  Pulmonary/Chest: Effort normal and breath sounds normal.  Abdominal: Soft. Bowel sounds are normal.  Neurological: She is alert and oriented to person, place,  and time.  Skin: Skin is warm and dry.  Psychiatric: She has a normal mood and affect. Her behavior is normal.    Diabetic Foot Exam - Simple   Simple Foot Form Diabetic Foot exam was performed with the following findings:  Yes 05/14/2017  3:00 PM  Visual Inspection No deformities, no ulcerations, no other skin breakdown bilaterally:  Yes Sensation Testing Intact to touch and monofilament testing bilaterally:  Yes Pulse Check Posterior Tibialis and Dorsalis pulse intact bilaterally:  Yes Comments         Assessment & Plan:     1. Type 2 diabetes mellitus with hyperosmolarity without coma, without long-term current use of insulin (HCC)  Labs as below. A1C is 8.1 and remains uncontrolled. I will try to prescribe triseba and victoza. Since she has never picked this up at a pharmacy and only gotten samples, I am uncertain how her insurance will treat it. Sample of tresiba given in office today, inject 20 units into skin subQ daily. Counseled on taking fasting blood sugars so we can track progress. Will also try to add back Victoza. No family history of thyroid or endocrine cancers.  - Comprehensive Metabolic Panel (CMET) - Urine Microalbumin w/creat. ratio - Lipid Profile - CBC with Differential - TSH - Ambulatory referral to Ophthalmology - HgB A1c - Ambulatory referral to diabetic education  2. Class 3 severe obesity with serious comorbidity and body mass index (BMI) of 40.0 to 44.9 in adult, unspecified obesity type (HCC)   3. Encounter for counseling regarding contraception  Will refill NuvaRing. She may wear it continuously for 4 weeks to avoid menstruation.  4. Amenorrhea  Pregnancy test negative. Suspect symptoms and weight gain were from stopping victoza.  - POCT urine pregnancy  5. Hypothydroidism, unspecified  Currently on 50 mcg Synthroid with Stable TSH. Will refill synthroid at current level  Return in about 1 year (around 05/15/2018), or if symptoms worsen or  fail to improve.  The entirety of the information documented in the History of Present Illness, Review of Systems and Physical Exam were personally obtained by me. Portions of this information were initially documented by Kavin LeechLaura Walsh, CMA and reviewed by me for thoroughness and accuracy.         Trey SailorsAdriana M Pollak, PA-C  [facility]

## 2017-05-14 NOTE — Patient Instructions (Signed)

## 2017-05-15 LAB — CBC WITH DIFFERENTIAL/PLATELET
Basophils Absolute: 0 10*3/uL (ref 0.0–0.2)
Basos: 0 %
EOS (ABSOLUTE): 0.1 10*3/uL (ref 0.0–0.4)
Eos: 1 %
Hematocrit: 36.8 % (ref 34.0–46.6)
Hemoglobin: 12 g/dL (ref 11.1–15.9)
Immature Grans (Abs): 0 10*3/uL (ref 0.0–0.1)
Immature Granulocytes: 0 %
Lymphocytes Absolute: 4.5 10*3/uL — ABNORMAL HIGH (ref 0.7–3.1)
Lymphs: 47 %
MCH: 26.3 pg — ABNORMAL LOW (ref 26.6–33.0)
MCHC: 32.6 g/dL (ref 31.5–35.7)
MCV: 81 fL (ref 79–97)
Monocytes Absolute: 0.5 10*3/uL (ref 0.1–0.9)
Monocytes: 6 %
Neutrophils Absolute: 4.4 10*3/uL (ref 1.4–7.0)
Neutrophils: 46 %
Platelets: 325 10*3/uL (ref 150–379)
RBC: 4.57 x10E6/uL (ref 3.77–5.28)
RDW: 13.2 % (ref 12.3–15.4)
WBC: 9.5 10*3/uL (ref 3.4–10.8)

## 2017-05-15 LAB — LIPID PANEL
Chol/HDL Ratio: 2.5 ratio (ref 0.0–4.4)
Cholesterol, Total: 148 mg/dL (ref 100–199)
HDL: 59 mg/dL (ref >39)
LDL Calculated: 57 mg/dL (ref 0–99)
Triglycerides: 158 mg/dL — ABNORMAL HIGH (ref 0–149)
VLDL Cholesterol Cal: 32 mg/dL (ref 5–40)

## 2017-05-15 LAB — HEMOGLOBIN A1C
Est. average glucose Bld gHb Est-mCnc: 186 mg/dL
Hgb A1c MFr Bld: 8.1 % — ABNORMAL HIGH (ref 4.8–5.6)

## 2017-05-15 LAB — COMPREHENSIVE METABOLIC PANEL
ALT: 9 IU/L (ref 0–32)
AST: 13 IU/L (ref 0–40)
Albumin/Globulin Ratio: 1.2 (ref 1.2–2.2)
Albumin: 4 g/dL (ref 3.5–5.5)
Alkaline Phosphatase: 86 IU/L (ref 39–117)
BUN/Creatinine Ratio: 9 (ref 9–23)
BUN: 7 mg/dL (ref 6–20)
Bilirubin Total: 0.3 mg/dL (ref 0.0–1.2)
CO2: 23 mmol/L (ref 20–29)
Calcium: 9.4 mg/dL (ref 8.7–10.2)
Chloride: 99 mmol/L (ref 96–106)
Creatinine, Ser: 0.75 mg/dL (ref 0.57–1.00)
GFR calc Af Amer: 127 mL/min/{1.73_m2} (ref >59)
GFR calc non Af Amer: 110 mL/min/{1.73_m2} (ref >59)
Globulin, Total: 3.3 g/dL (ref 1.5–4.5)
Glucose: 301 mg/dL — ABNORMAL HIGH (ref 65–99)
Potassium: 3.8 mmol/L (ref 3.5–5.2)
Sodium: 136 mmol/L (ref 134–144)
Total Protein: 7.3 g/dL (ref 6.0–8.5)

## 2017-05-15 LAB — POCT URINE PREGNANCY: Preg Test, Ur: NEGATIVE

## 2017-05-15 LAB — MICROALBUMIN / CREATININE URINE RATIO
Creatinine, Urine: 85.9 mg/dL
Microalb/Creat Ratio: <3.5 mg/g creat (ref 0.0–30.0)
Microalbumin, Urine: <3 ug/mL

## 2017-05-15 LAB — TSH: TSH: 2.56 u[IU]/mL (ref 0.450–4.500)

## 2017-05-15 MED ORDER — LIRAGLUTIDE 18 MG/3ML ~~LOC~~ SOPN: 0.6000 mg | PEN_INJECTOR | Freq: Every morning | SUBCUTANEOUS | 2 refills | Status: DC

## 2017-05-15 MED ORDER — INSULIN DEGLUDEC 100 UNIT/ML ~~LOC~~ SOPN: 20.0000 [IU] | PEN_INJECTOR | Freq: Every day | SUBCUTANEOUS | 4 refills | Status: DC

## 2017-05-15 MED ORDER — ETONOGESTREL-ETHINYL ESTRADIOL 0.12-0.015 MG/24HR VA RING: VAGINAL_RING | VAGINAL | 11 refills | Status: DC

## 2017-05-16 ENCOUNTER — Telehealth: Payer: Self-pay

## 2017-05-16 NOTE — Telephone Encounter (Signed)
Pt advised.   Thanks,   -Laura  

## 2017-05-16 NOTE — Telephone Encounter (Signed)
-----   Message from Trey SailorsAdriana M Pollak, [place] sent at 05/16/2017  8:51 AM EST ----- Pregnancy test, CMET with high glucose but otherwise normal. Her A1c is 8.1. TSH is normal. CBC is normal. Cholesterol very well controlled. I have sent in for triseba and victoza and will await any prior authorizations. I have also sent in NuvaRing, she may use continuously to avoid menstruation, replace every 4 weeks. Please check fasting blood sugars and keep log.

## 2017-05-23 ENCOUNTER — Telehealth: Payer: Self-pay | Admitting: Physician Assistant

## 2017-05-23 NOTE — Telephone Encounter (Signed)
Cover My Meds called wanting to get update on Prior Authorization for Triseba.  Please call back with [id number]

## 2017-05-25 ENCOUNTER — Encounter: Payer: Self-pay | Admitting: Physician Assistant

## 2017-05-29 NOTE — Telephone Encounter (Signed)
PA sent

## 2017-06-04 ENCOUNTER — Ambulatory Visit: Payer: Self-pay | Admitting: Dietician

## 2017-07-03 ENCOUNTER — Ambulatory Visit: Payer: Managed Care, Other (non HMO) | Admitting: Physician Assistant

## 2017-07-05 ENCOUNTER — Other Ambulatory Visit: Payer: Self-pay | Admitting: Physician Assistant

## 2017-07-05 MED ORDER — LEVOTHYROXINE SODIUM 50 MCG PO TABS: 50.0000 ug | ORAL_TABLET | Freq: Every day | ORAL | 1 refills | Status: DC

## 2017-07-05 NOTE — Telephone Encounter (Signed)
Pt contacted office for refill request on the following medications:  levothyroxine (SYNTHROID, LEVOTHROID) 50 MCG tablet   [facility] [address]  Pt stated that at new pt appt she was advised that Ricki Rodriguezdriana would take over maintenance on the medication. Please advise. Thanks TNP

## 2017-07-31 ENCOUNTER — Telehealth: Payer: Self-pay | Admitting: Physician Assistant

## 2017-07-31 NOTE — Telephone Encounter (Signed)
Pt called wanting to know if we have any Victoza samples in stock.  Her call back is [phone]  Thanks teri.

## 2017-08-01 ENCOUNTER — Encounter: Payer: Self-pay | Admitting: Physician Assistant

## 2017-08-01 NOTE — Telephone Encounter (Signed)
Pt called back wanting to know if we have any samples of Victoza.  She can't use [facility] now because of her insurance.  Pt stated that She has to use [facility] and they will need a PA before they can fill the Victoza.  Pt's call back is [phone]  Thanks Barth Kirks

## 2017-08-02 ENCOUNTER — Telehealth: Payer: Self-pay | Admitting: Emergency Medicine

## 2017-08-02 NOTE — Telephone Encounter (Signed)
Pt called requesting a status on her PA for Victoza.

## 2017-08-02 NOTE — Telephone Encounter (Signed)
Please advise 

## 2017-08-03 ENCOUNTER — Encounter: Payer: Self-pay | Admitting: Physician Assistant

## 2017-08-03 ENCOUNTER — Ambulatory Visit: Payer: Managed Care, Other (non HMO) | Admitting: Physician Assistant

## 2017-08-03 VITALS — BP 122/70 | HR 70 | Temp 98.7°F | Resp 16 | Wt 241.0 lb

## 2017-08-03 DIAGNOSIS — Z794 Long term (current) use of insulin: Secondary | ICD-10-CM

## 2017-08-03 DIAGNOSIS — N898 Other specified noninflammatory disorders of vagina: Secondary | ICD-10-CM | POA: Diagnosis not present

## 2017-08-03 DIAGNOSIS — E1165 Type 2 diabetes mellitus with hyperglycemia: Secondary | ICD-10-CM

## 2017-08-03 DIAGNOSIS — E1100 Type 2 diabetes mellitus with hyperosmolarity without nonketotic hyperglycemic-hyperosmolar coma (NKHHC): Secondary | ICD-10-CM | POA: Diagnosis not present

## 2017-08-03 HISTORY — DX: Type 2 diabetes mellitus with hyperosmolarity without nonketotic hyperglycemic-hyperosmolar coma (NKHHC): E11.00

## 2017-08-03 HISTORY — DX: Long term (current) use of insulin: Z79.4

## 2017-08-03 MED ORDER — FLUCONAZOLE 150 MG PO TABS: ORAL_TABLET | ORAL | 0 refills | Status: DC

## 2017-08-03 NOTE — Patient Instructions (Signed)

## 2017-08-03 NOTE — Telephone Encounter (Signed)
PA Sent

## 2017-08-03 NOTE — Progress Notes (Signed)
Patient: Katie [name] Female    DOB: April 30, 1990   27 y.o.   MRN: [id number] Visit Date: 08/03/2017  Today's Provider: Trey Sailors, PA-C   Chief Complaint  Patient presents with  . Vaginitis   Subjective:    Katie [name] is a 27 y/o woman presenting today with uncontrolled diabetes II and concerns for a yeast infection. Pertinent info regarding yeast infection below.  She reports her fasting blood sugars are >300. She is currently on 22 units of [place] daily. We are in the process of completing prior authorization for Victoza.  Vaginal Discharge  The patient's primary symptoms include genital itching and vaginal discharge. The current episode started in the past 7 days (5 days). The problem has been gradually worsening. Pertinent negatives include no back pain, dysuria, flank pain, frequency, nausea or urgency. The vaginal discharge was milky and white. There has been no bleeding. She has tried nothing for the symptoms.       No Known Allergies   Current Outpatient Medications:  .  etonogestrel-ethinyl estradiol (NUVARING) 0.12-0.015 MG/24HR vaginal ring, Insert vaginally and leave in place for 4 consecutive weeks, then remove and replace with a new ring., Disp: 1 each, Rfl: 11 .  insulin degludec (TRESIBA FLEXTOUCH) 100 UNIT/ML SOPN FlexTouch Pen, Inject 0.2 mLs (20 Units total) into the skin daily at 10 pm., Disp: 3 mL, Rfl: 4 .  levothyroxine (SYNTHROID, LEVOTHROID) 50 MCG tablet, Take 1 tablet (50 mcg total) by mouth daily., Disp: 90 tablet, Rfl: 1 .  liraglutide (VICTOZA) 18 MG/3ML SOPN, Inject 0.1 mLs (0.6 mg total) into the skin every morning., Disp: 3 mL, Rfl: 2 .  Prenatal Vit-Fe Fumarate-FA (PRENATAL MULTIVITAMIN) TABS, Take 1 tablet by mouth daily., Disp: , Rfl:   Review of Systems  Gastrointestinal: Negative for nausea.  Genitourinary: Positive for vaginal discharge. Negative for dysuria, flank pain, frequency and urgency.  Musculoskeletal: Negative  for back pain.    Social History   Tobacco Use  . Smoking status: Never Smoker  . Smokeless tobacco: Never Used  Substance Use Topics  . Alcohol use: No   Objective:   BP 122/70   Pulse 70   Temp 98.7 F (37.1 C)   Resp 16   Wt 241 lb (109.3 kg)   SpO2 97%   BMI 43.03 kg/m  Vitals:   08/03/17 1609  BP: 122/70  Pulse: 70  Resp: 16  Temp: 98.7 F (37.1 C)  SpO2: 97%  Weight: 241 lb (109.3 kg)     Physical Exam  Constitutional: She is oriented to person, place, and time.  Cardiovascular: Normal rate and regular rhythm.  Pulmonary/Chest: Effort normal and breath sounds normal.  Genitourinary: There is no rash, tenderness, lesion or injury on the right labia. There is no rash, tenderness, lesion or injury on the left labia.  Genitourinary Comments: Some thing white discharge in vaginal vault.   Neurological: She is alert and oriented to person, place, and time.  Skin: Skin is warm and dry.  Psychiatric: She has a normal mood and affect. Her behavior is normal.        Assessment & Plan:     1. Vaginal discharge  Treat orally, she has a NUvaring. Send swab as below.  - fluconazole (DIFLUCAN) 150 MG tablet; Use 1 pill on day one. May take second pill three days later if still symptomatic.  Dispense: 2 tablet; Refill: 0 - NuSwab Vaginitis (VG)  2. DM hyperosmolarity type II,  uncontrolled (HCC)  She is currently using 22 units [place] daily. She may titrate by 2 units every 3 days up to 30 units daily. See her back in June. Will work on getting Victoza approved.   Return if symptoms worsen or fail to improve.  The entirety of the information documented in the History of Present Illness, Review of Systems and Physical Exam were personally obtained by me. Portions of this information were initially documented by Anson [place], CMA and reviewed by me for thoroughness and accuracy.          Trey Sailors, PA-C  [facility]

## 2017-08-07 ENCOUNTER — Telehealth: Payer: Self-pay

## 2017-08-07 LAB — NUSWAB VAGINITIS (VG)
Candida albicans, NAA: NEGATIVE
Candida glabrata, NAA: NEGATIVE
Trich vag by NAA: NEGATIVE

## 2017-08-07 NOTE — Telephone Encounter (Signed)
Pt advised.   Thanks,   -Laura  

## 2017-08-07 NOTE — Telephone Encounter (Signed)
Victoza has been approved, will send in.

## 2017-08-07 NOTE — Telephone Encounter (Signed)
-----   Message from Trey Sailors, [place] sent at 08/07/2017  8:21 AM EDT ----- NuSwab did not show bacterial vaginitis or yeast.

## 2017-08-08 ENCOUNTER — Other Ambulatory Visit: Payer: Self-pay | Admitting: Physician Assistant

## 2017-08-08 DIAGNOSIS — E1100 Type 2 diabetes mellitus with hyperosmolarity without nonketotic hyperglycemic-hyperosmolar coma (NKHHC): Secondary | ICD-10-CM

## 2017-08-08 NOTE — Progress Notes (Signed)
[id number] 

## 2017-08-08 NOTE — Progress Notes (Signed)
Patient states that she already picked up prescription at [facility]. Patient states that she had question about dosage she states that in office she was advised to 0.6 for one week than increase 1.2 for another week than increase to 1.8.  Patient states that on prescription it states to take 0.6. Patient wold like clarification or if another prescription would be sent to pharmacy. KW

## 2017-08-08 NOTE — Progress Notes (Signed)
[id number] 

## 2017-08-08 NOTE — Progress Notes (Signed)
Looks like Victoza was approved. Which pharmacy does she want me to send it to?

## 2017-08-09 NOTE — Progress Notes (Signed)
Yes, she can do 0.6mg  subQ daily x 1 wk, then 1.2 mg subQ daily x 1 wk, then 1.8 mg subQ daily x 1 wk. I will write new rx for her when she runs out, but we should be seeing her in clinic shortly as well.

## 2017-08-10 NOTE — Progress Notes (Signed)
LMTCB 08/10/2017  Thanks,   -Asia Dusenbury  

## 2017-08-10 NOTE — Progress Notes (Signed)
Pt advised.   Thanks,   -Ritvik Mczeal  

## 2017-09-04 ENCOUNTER — Ambulatory Visit: Payer: Managed Care, Other (non HMO) | Admitting: Physician Assistant

## 2017-09-04 ENCOUNTER — Telehealth: Payer: Self-pay | Admitting: Physician Assistant

## 2017-09-04 ENCOUNTER — Encounter: Payer: Self-pay | Admitting: Physician Assistant

## 2017-09-04 VITALS — BP 122/80 | HR 76 | Temp 98.6°F | Resp 16 | Wt 243.0 lb

## 2017-09-04 DIAGNOSIS — E1100 Type 2 diabetes mellitus with hyperosmolarity without nonketotic hyperglycemic-hyperosmolar coma (NKHHC): Secondary | ICD-10-CM | POA: Diagnosis not present

## 2017-09-04 DIAGNOSIS — E1165 Type 2 diabetes mellitus with hyperglycemia: Secondary | ICD-10-CM

## 2017-09-04 MED ORDER — LIRAGLUTIDE 18 MG/3ML ~~LOC~~ SOPN
1.8000 mg | PEN_INJECTOR | Freq: Every morning | SUBCUTANEOUS | 0 refills | Status: DC
Start: 2017-09-04 — End: 2017-12-31

## 2017-09-04 MED ORDER — METFORMIN HCL ER 500 MG PO TB24: ORAL_TABLET | ORAL | 0 refills | Status: DC

## 2017-09-04 MED ORDER — INSULIN DEGLUDEC 100 UNIT/ML ~~LOC~~ SOPN: 28.0000 [IU] | PEN_INJECTOR | Freq: Every day | SUBCUTANEOUS | 0 refills | Status: AC

## 2017-09-04 NOTE — Patient Instructions (Signed)

## 2017-09-04 NOTE — Telephone Encounter (Signed)
Natalia LeatherwoodKatherine with [facility] is requesting call back for clarification on metFORMIN (GLUCOPHAGE XR) 500 MG 24 hr tablet. Please advise. Thanks TNP

## 2017-09-04 NOTE — Progress Notes (Signed)
Patient: Katie [name] Female    DOB: 17-Sep-1990   27 y.o.   MRN: [id number] Visit Date: 09/12/2017  Today's Provider: Trey SailorsAdriana M Pollak, PA-C   Chief Complaint  Patient presents with  . Diabetes   Subjective:    Diabetes  She presents for her follow-up diabetic visit. She has type 2 diabetes mellitus. Her disease course has been stable. Pertinent negatives for hypoglycemia include no dizziness or headaches. Pertinent negatives for diabetes include no blurred vision, no chest pain, no fatigue, no foot paresthesias, no foot ulcerations, no polydipsia, no polyphagia, no polyuria, no visual change, no weakness and no weight loss. Symptoms are stable.   Currently using basal insulin Tresiba 28 units. Was off victoza for a while, recently restated it after getting insurance approval. Previously had indigestion with metformin. A1c increased from 8.1 to 9.6      No Known Allergies   Current Outpatient Medications:  .  etonogestrel-ethinyl estradiol (NUVARING) 0.12-0.015 MG/24HR vaginal ring, Insert vaginally and leave in place for 4 consecutive weeks, then remove and replace with a new ring., Disp: 1 each, Rfl: 11 .  fluconazole (DIFLUCAN) 150 MG tablet, Use 1 pill on day one. May take second pill three days later if still symptomatic., Disp: 2 tablet, Rfl: 0 .  insulin degludec (TRESIBA FLEXTOUCH) 100 UNIT/ML SOPN FlexTouch Pen, Inject 0.28 mLs (28 Units total) into the skin daily at 10 pm., Disp: 25.2 mL, Rfl: 0 .  levothyroxine (SYNTHROID, LEVOTHROID) 50 MCG tablet, Take 1 tablet (50 mcg total) by mouth daily., Disp: 90 tablet, Rfl: 1 .  liraglutide (VICTOZA) 18 MG/3ML SOPN, Inject 0.3 mLs (1.8 mg total) into the skin every morning., Disp: 27 mL, Rfl: 0 .  metFORMIN (GLUCOPHAGE XR) 500 MG 24 hr tablet, 1 pill daily for 1 wk. Take 1 pill twice daily for 1 wk. Take 2 pills in the morning and 1 at night x 1 wk. Take 2 pills twice daily x 1 wk., Disp: 180 tablet, Rfl: 0 .  Prenatal  Vit-Fe Fumarate-FA (PRENATAL MULTIVITAMIN) TABS, Take 1 tablet by mouth daily., Disp: , Rfl:   Review of Systems  Constitutional: Negative.  Negative for fatigue and weight loss.  Eyes: Negative for blurred vision.  Respiratory: Negative.   Cardiovascular: Negative.  Negative for chest pain.  Gastrointestinal: Positive for diarrhea and nausea. Negative for abdominal distention, abdominal pain, anal bleeding, blood in stool, constipation, rectal pain and vomiting.  Endocrine: Negative.  Negative for polydipsia, polyphagia and polyuria.  Musculoskeletal: Positive for back pain.  Neurological: Negative for dizziness, weakness, light-headedness and headaches.    Social History   Tobacco Use  . Smoking status: Never Smoker  . Smokeless tobacco: Never Used  Substance Use Topics  . Alcohol use: No   Objective:   BP 122/80 (BP Location: Right Arm, Patient Position: Sitting, Cuff Size: Large)   Pulse 76   Temp 98.6 F (37 C) (Oral)   Resp 16   Wt 243 lb (110.2 kg)   BMI 43.39 kg/m  Vitals:   09/04/17 1220  BP: 122/80  Pulse: 76  Resp: 16  Temp: 98.6 F (37 C)  TempSrc: Oral  Weight: 243 lb (110.2 kg)     Physical Exam  Constitutional: She is oriented to person, place, and time. She appears well-developed and well-nourished.  Cardiovascular: Normal rate and regular rhythm.  Pulmonary/Chest: Effort normal and breath sounds normal.  Neurological: She is alert and oriented to person, place, and time.  Skin: Skin is warm and dry.  Psychiatric: She has a normal mood and affect. Her behavior is normal.        Assessment & Plan:     1. DM hyperosmolarity type II, uncontrolled (HCC)  May be in window period where victoza was not being taken. Will start metformin XR to hopefully decrease side effects, patient willing to try this again.   - liraglutide (VICTOZA) 18 MG/3ML SOPN; Inject 0.3 mLs (1.8 mg total) into the skin every morning.  Dispense: 27 mL; Refill: 0 -Metformin 500  mg XR titrating up to 1000 mg BID  2. Type 2 diabetes mellitus with hyperosmolarity without coma, without long-term current use of insulin (HCC)  Will submit again, unlikely to be approved by insurance, will find alternative.   - insulin degludec (TRESIBA FLEXTOUCH) 100 UNIT/ML SOPN FlexTouch Pen; Inject 0.28 mLs (28 Units total) into the skin daily at 10 pm.  Dispense: 25.2 mL; Refill: 0 - POCT HgB A1C  Return in about 1 month (around 10/04/2017) for DM.  The entirety of the information documented in the History of Present Illness, Review of Systems and Physical Exam were personally obtained by me. Portions of this information were initially documented by Kavin Leech, CMA and reviewed by me for thoroughness and accuracy.         Trey Sailors, PA-C  [facility]

## 2017-09-05 MED ORDER — METFORMIN HCL ER 500 MG PO TB24: ORAL_TABLET | ORAL | 0 refills | Status: DC

## 2017-09-05 NOTE — Telephone Encounter (Signed)
Please verify the directions.  Thanks,   -Vernona RiegerLaura

## 2017-09-12 LAB — POCT GLYCOSYLATED HEMOGLOBIN (HGB A1C): Hemoglobin A1C: 9.6 % — AB (ref 4.0–5.6)

## 2017-09-19 ENCOUNTER — Encounter: Payer: Self-pay | Admitting: Physician Assistant

## 2017-10-09 ENCOUNTER — Ambulatory Visit: Payer: Managed Care, Other (non HMO) | Admitting: Physician Assistant

## 2017-12-31 ENCOUNTER — Other Ambulatory Visit: Payer: Self-pay | Admitting: Physician Assistant

## 2017-12-31 DIAGNOSIS — E1165 Type 2 diabetes mellitus with hyperglycemia: Principal | ICD-10-CM

## 2017-12-31 DIAGNOSIS — E1100 Type 2 diabetes mellitus with hyperosmolarity without nonketotic hyperglycemic-hyperosmolar coma (NKHHC): Secondary | ICD-10-CM

## 2018-01-01 NOTE — Telephone Encounter (Signed)
Patient advised. She states she will call back this afternoon and schedule a follow up OV.

## 2018-01-01 NOTE — Telephone Encounter (Signed)
Can we please schedule for appointment? She cancelled her last follow up and her A1c is uncontrolled.

## 2018-01-07 ENCOUNTER — Other Ambulatory Visit: Payer: Self-pay | Admitting: Physician Assistant

## 2018-01-07 DIAGNOSIS — N898 Other specified noninflammatory disorders of vagina: Secondary | ICD-10-CM

## 2018-01-07 NOTE — Telephone Encounter (Signed)
Pt contacted office for refill request on the following medications:  fluconazole (DIFLUCAN) 150 MG tablet  [facility] [address]  Last Rx: 08/03/17 Please advise. Thanks TNP

## 2018-01-09 MED ORDER — FLUCONAZOLE 150 MG PO TABS: ORAL_TABLET | ORAL | 0 refills | Status: DC

## 2018-01-09 NOTE — Telephone Encounter (Signed)
I already sent this. In general I do not refill yeast medications and she should be seen if symptoms persist.

## 2018-01-09 NOTE — Telephone Encounter (Signed)
Patient called office backs to inquire about prescription request for Diflucan. Patient is requesting this be sent to [facility] on [address] before end of business day. KW

## 2018-01-12 ENCOUNTER — Emergency Department
Admission: EM | Admit: 2018-01-12 | Discharge: 2018-01-12 | Disposition: A | Discharge status: Home / Self Care | Payer: Managed Care, Other (non HMO) | Attending: Emergency Medicine | Admitting: Emergency Medicine

## 2018-01-12 ENCOUNTER — Encounter: Payer: Self-pay | Admitting: Emergency Medicine

## 2018-01-12 ENCOUNTER — Other Ambulatory Visit: Payer: Self-pay

## 2018-01-12 DIAGNOSIS — Z7984 Long term (current) use of oral hypoglycemic drugs: Secondary | ICD-10-CM | POA: Diagnosis not present

## 2018-01-12 DIAGNOSIS — B3731 Acute candidiasis of vulva and vagina: Secondary | ICD-10-CM

## 2018-01-12 DIAGNOSIS — B373 Candidiasis of vulva and vagina: Secondary | ICD-10-CM | POA: Diagnosis not present

## 2018-01-12 DIAGNOSIS — N898 Other specified noninflammatory disorders of vagina: Secondary | ICD-10-CM | POA: Diagnosis present

## 2018-01-12 DIAGNOSIS — Z79899 Other long term (current) drug therapy: Secondary | ICD-10-CM | POA: Diagnosis not present

## 2018-01-12 DIAGNOSIS — E1165 Type 2 diabetes mellitus with hyperglycemia: Secondary | ICD-10-CM | POA: Insufficient documentation

## 2018-01-12 DIAGNOSIS — R739 Hyperglycemia, unspecified: Secondary | ICD-10-CM

## 2018-01-12 HISTORY — DX: Disorder of thyroid, unspecified: E07.9

## 2018-01-12 HISTORY — DX: Type 2 diabetes mellitus without complications: E11.9

## 2018-01-12 LAB — CBC WITH DIFFERENTIAL/PLATELET
ABS IMMATURE GRANULOCYTES: 0.02 10*3/uL (ref 0.00–0.07)
Basophils Absolute: 0 10*3/uL (ref 0.0–0.1)
Basophils Relative: 1 %
Eosinophils Absolute: 0 10*3/uL (ref 0.0–0.5)
Eosinophils Relative: 1 %
HEMATOCRIT: 42.6 % (ref 36.0–46.0)
Hemoglobin: 13.8 g/dL (ref 12.0–15.0)
Immature Granulocytes: 0 %
LYMPHS ABS: 3.7 10*3/uL (ref 0.7–4.0)
LYMPHS PCT: 52 %
MCH: 26.6 pg (ref 26.0–34.0)
MCHC: 32.4 g/dL (ref 30.0–36.0)
MCV: 82.2 fL (ref 80.0–100.0)
MONOS PCT: 8 %
Monocytes Absolute: 0.6 10*3/uL (ref 0.1–1.0)
NEUTROS ABS: 2.7 10*3/uL (ref 1.7–7.7)
NEUTROS PCT: 38 %
Platelets: 260 10*3/uL (ref 150–400)
RBC: 5.18 MIL/uL — ABNORMAL HIGH (ref 3.87–5.11)
RDW: 12.6 % (ref 11.5–15.5)
WBC: 7.1 10*3/uL (ref 4.0–10.5)
nRBC: 0 % (ref 0.0–0.2)

## 2018-01-12 LAB — GLUCOSE, CAPILLARY
GLUCOSE-CAPILLARY: 289 mg/dL — AB (ref 70–99)
GLUCOSE-CAPILLARY: 252 mg/dL — AB (ref 70–99)
Glucose-Capillary: 415 mg/dL — ABNORMAL HIGH (ref 70–99)

## 2018-01-12 LAB — WET PREP, GENITAL
Clue Cells Wet Prep HPF POC: NONE SEEN
SPERM: NONE SEEN
Trich, Wet Prep: NONE SEEN
Yeast Wet Prep HPF POC: NONE SEEN

## 2018-01-12 LAB — COMPREHENSIVE METABOLIC PANEL
ALBUMIN: 3.5 g/dL (ref 3.5–5.0)
ALT: 18 U/L (ref 0–44)
AST: 20 U/L (ref 15–41)
Alkaline Phosphatase: 74 U/L (ref 38–126)
Anion gap: 7 (ref 5–15)
BILIRUBIN TOTAL: 0.6 mg/dL (ref 0.3–1.2)
BUN: 8 mg/dL (ref 6–20)
CO2: 25 mmol/L (ref 22–32)
CREATININE: 0.74 mg/dL (ref 0.44–1.00)
Calcium: 8.5 mg/dL — ABNORMAL LOW (ref 8.9–10.3)
Chloride: 106 mmol/L (ref 98–111)
GFR calc Af Amer: >60 mL/min (ref >60)
GLUCOSE: 369 mg/dL — AB (ref 70–99)
Potassium: 3.8 mmol/L (ref 3.5–5.1)
Sodium: 138 mmol/L (ref 135–145)
TOTAL PROTEIN: 6.9 g/dL (ref 6.5–8.1)

## 2018-01-12 MED ORDER — SODIUM CHLORIDE 0.9 % IV BOLUS
1000.0000 mL | Freq: Once | INTRAVENOUS | Status: AC
  Administered 2018-01-12: 1000 mL via INTRAVENOUS

## 2018-01-12 MED ORDER — HYDROCODONE-ACETAMINOPHEN 5-325 MG PO TABS: 1.0000 | ORAL_TABLET | Freq: Four times a day (QID) | ORAL | 0 refills | Status: AC | PRN

## 2018-01-12 MED ORDER — OXYCODONE-ACETAMINOPHEN 5-325 MG PO TABS
1.0000 | ORAL_TABLET | Freq: Once | ORAL | Status: AC
  Administered 2018-01-12: 1 via ORAL
  Filled 2018-01-12: qty 1

## 2018-01-12 MED ORDER — LIDOCAINE HCL 4 % EX SOLN
Freq: Three times a day (TID) | CUTANEOUS | Status: DC | PRN
  Filled 2018-01-12 (×2): qty 50

## 2018-01-12 MED ORDER — FLUCONAZOLE 50 MG PO TABS
150.0000 mg | ORAL_TABLET | Freq: Once | ORAL | Status: AC
  Administered 2018-01-12: 150 mg via ORAL
  Filled 2018-01-12: qty 1

## 2018-01-12 MED ORDER — NYSTATIN 100000 UNIT/GM EX POWD: Freq: Three times a day (TID) | CUTANEOUS | 0 refills | Status: DC

## 2018-01-12 NOTE — ED Triage Notes (Signed)
States blood sugars have been running for about 2 weeks. States fasting blood sugars have not been below 250 for 2 weeks. States she has an appointment with PCP Wednesday 11/6 for follow up with this. States has used insulin in past for glucose control and thinks she needs to go back on insulin. Patient states when her blood sugars run high she gets yeast infections and that this is actually the reason for her visits. Started getting vaginal discomfort x 1 week ago.

## 2018-01-12 NOTE — ED Notes (Signed)
Will defer Vaginal exam to provider.

## 2018-01-12 NOTE — ED Notes (Signed)
NS infusing briskly in IV in Left AC.

## 2018-01-12 NOTE — Discharge Instructions (Signed)
These keep your scheduled appointment with primary care.  Make sure you adhere to your diabetic diet especially avoiding foods high in carbohydrates.  Use the nystatin powder 3 times per day to help get rid of the yeast infection on your skin.  Use the topical lidocaine as needed for pain.

## 2018-01-12 NOTE — ED Provider Notes (Signed)
[facility] Emergency Department Provider Note  ____________________________________________   None    (approximate)  I have reviewed the triage vital signs and the nursing notes.   HISTORY  Chief Complaint Vaginal Discharge and Hyperglycemia   HPI Katie [name] is a 27 y.o. female who presents to the emergency department for treatment and evaluation of vaginal discomfort and thick white discharge that started approximately 1 week ago.  As of late, her fasting blood sugars have been 250 or above despite taking her medications as prescribed.  Patient states that she was finally able to get a prescription for Diflucan last Thursday, but states that the vaginal pain and discharge has continuously worsened.  She also states that the pain is so bad that she is unable to remove her NuvaRing which is more than 2 weeks overdue.  She denies fevers, nausea, vomiting, diarrhea, or abdominal pain.  Past Medical History:  Diagnosis Date  . Diabetes mellitus without complication (HCC)   . Thyroid disease     Patient Active Problem List   Diagnosis Date Noted  . DM hyperosmolarity type II, uncontrolled (HCC) 08/03/2017    Past Surgical History:  Procedure Laterality Date  . CESAREAN SECTION      Prior to Admission medications   Medication Sig Start Date End Date Taking? Authorizing Provider  etonogestrel-ethinyl estradiol (NUVARING) 0.12-0.015 MG/24HR vaginal ring Insert vaginally and leave in place for 4 consecutive weeks, then remove and replace with a new ring. 05/15/17   Trey Sailors, PA-C  fluconazole (DIFLUCAN) 150 MG tablet Use 1 pill on day one. May take second pill three days later if still symptomatic. 01/09/18   Trey Sailors, PA-C  HYDROcodone-acetaminophen (NORCO/VICODIN) 5-325 MG tablet Take 1 tablet by mouth every 6 (six) hours as needed for up to 3 days for severe pain. 01/12/18 01/15/18  Malashia Kamaka, Rulon Eisenmenger B, FNP  levothyroxine (SYNTHROID,  LEVOTHROID) 50 MCG tablet Take 1 tablet (50 mcg total) by mouth daily. 07/05/17   Trey Sailors, PA-C  liraglutide (VICTOZA) 18 MG/3ML SOPN Inject 0.3 mLs (1.8 mg total) into the skin daily. Please schedule office visit before any future refills 01/01/18   Trey Sailors, PA-C  metFORMIN (GLUCOPHAGE XR) 500 MG 24 hr tablet 1 pill daily for 1 wk. Take 1 pill twice daily for 1 wk. Take 2 pills in the morning and 1 at night x 1 wk. Take 2 pills twice daily x 1 wk. 09/05/17   Trey Sailors, PA-C  nystatin (MYCOSTATIN/NYSTOP) powder Apply topically 3 (three) times daily. 01/12/18   Verniece Encarnacion, Kasandra Knudsen, FNP  Prenatal Vit-Fe Fumarate-FA (PRENATAL MULTIVITAMIN) TABS Take 1 tablet by mouth daily.    [provider]    Allergies Patient has no known allergies.  Family History  Problem Relation Age of Onset  . Diabetes Mother   . Hypertension Mother   . Diabetes Father   . Kidney disease Maternal Grandmother   . Leukemia Maternal Grandfather   . Diabetes Paternal Grandmother   . Breast cancer Maternal Aunt     Social History Social History   Tobacco Use  . Smoking status: Never Smoker  . Smokeless tobacco: Never Used  Substance Use Topics  . Alcohol use: No  . Drug use: No    Review of Systems  Constitutional: No fever/chills Eyes: No visual changes. ENT: No sore throat. Cardiovascular: Denies chest pain. Respiratory: Denies shortness of breath. Gastrointestinal: No abdominal pain.  No nausea, no vomiting.  No diarrhea.  No constipation. Genitourinary: Negative for dysuria.  Positive for vaginal discharge Musculoskeletal: Negative for back pain. Skin: Negative for rash.  Positive for tenderness over the vulva, labia, and inner thighs. Neurological: Negative for headaches, focal weakness or numbness. ____________________________________________   PHYSICAL EXAM:  VITAL SIGNS: ED Triage Vitals  Enc Vitals Group     BP 01/12/18 1710 127/71     Pulse Rate 01/12/18  1710 77     Resp 01/12/18 1710 20     Temp 01/12/18 1710 98.2 F (36.8 C)     Temp Source 01/12/18 1710 Oral     SpO2 01/12/18 1710 100 %     Weight 01/12/18 1711 240 lb (108.9 kg)     Height 01/12/18 1711 5\' 2"  (1.575 m)     Head Circumference --      Peak Flow --      Pain Score 01/12/18 1710 10     Pain Loc --      Pain Edu? --      Excl. in GC? --     Constitutional: Alert and oriented. Well appearing and in no acute distress. Eyes: Conjunctivae are normal.  Head: Atraumatic. Nose: No congestion/rhinnorhea. Mouth/Throat: Mucous membranes are moist.  Oropharynx non-erythematous. Neck: No stridor.   Cardiovascular: Normal rate, regular rhythm. Grossly normal heart sounds.  Good peripheral circulation. Respiratory: Normal respiratory effort.  No retractions. Lungs CTAB. Gastrointestinal: Soft and nontender. No distention. No abdominal bruits. No CVA tenderness. GYN exam: Thick, white coating on the vaginal walls.  No cervical motion tenderness, however the cervix is erythematous.  NuvaRing was identified and removed per patient's request. Musculoskeletal: No lower extremity tenderness nor edema.  No joint effusions. Neurologic:  Normal speech and language. No gross focal neurologic deficits are appreciated. No gait instability. Skin: Skin overlying the lower vulva, labia majora, labia minora, and at the skin folds of the thighs has an overlying white coating with fissures scattered over the area. Psychiatric: Mood and affect are normal. Speech and behavior are normal.  ____________________________________________   LABS (all labs ordered are listed, but only abnormal results are displayed)  Labs Reviewed  WET PREP, GENITAL - Abnormal; Notable for the following components:      Result Value   WBC, Wet Prep HPF POC FEW (*)    All other components within normal limits  GLUCOSE, CAPILLARY - Abnormal; Notable for the following components:   Glucose-Capillary 415 (*)    All  other components within normal limits  CBC WITH DIFFERENTIAL/PLATELET - Abnormal; Notable for the following components:   RBC 5.18 (*)    All other components within normal limits  COMPREHENSIVE METABOLIC PANEL - Abnormal; Notable for the following components:   Glucose, Bld 369 (*)    Calcium 8.5 (*)    All other components within normal limits  GLUCOSE, CAPILLARY - Abnormal; Notable for the following components:   Glucose-Capillary 289 (*)    All other components within normal limits  GLUCOSE, CAPILLARY - Abnormal; Notable for the following components:   Glucose-Capillary 252 (*)    All other components within normal limits  CBG MONITORING, ED   ____________________________________________  EKG  Not indicated ____________________________________________  RADIOLOGY  ED MD interpretation: Not indicated  Official radiology report(s): No results found.  ____________________________________________   PROCEDURES  Procedure(s) performed: None  Procedures  Critical Care performed: No  ____________________________________________   INITIAL IMPRESSION / ASSESSMENT AND PLAN / ED COURSE  As part of my medical decision making, I reviewed the  following data within the electronic MEDICAL RECORD NUMBER    27 year old female presenting to the emergency department for treatment and evaluation of vaginal pain secondary to uncontrolled glucose.  She has an appointment on Wednesday to discuss the glucose issue with her primary care provider.  While here, she received 2 L of fluid which decreased her glucose from over 400 to around 250.  Also, pelvic exam was performed which shows large amount of yeast despite the negative results of the wet prep.  She was treated with another round of Diflucan.  She will be sent home with topical lidocaine and a prescription for the nystatin powder to be applied to the external vulva and vagina area.  She is to keep her appointment on Wednesday.  She is to  return to the emergency department for any symptom that changes or worsen if she is unable to schedule an earlier appointment.  She was advised to strictly adhere to her diabetic diet, specifically avoiding carbohydrates.  Clinical Course as of Jan 14 12  Sat Jan 12, 2018  2025 Sperm: NONE SEEN [CT]    Clinical Course User Index [CT] Chinita Pester, FNP     ____________________________________________   FINAL CLINICAL IMPRESSION(S) / ED DIAGNOSES  Final diagnoses:  Hyperglycemia  Vaginal candidiasis     ED Discharge Orders         Ordered    nystatin (MYCOSTATIN/NYSTOP) powder  3 times daily     01/12/18 2131    HYDROcodone-acetaminophen (NORCO/VICODIN) 5-325 MG tablet  Every 6 hours PRN     01/12/18 2131           Note:  This document was prepared using Dragon voice recognition software and may include unintentional dictation errors.    Chinita Pester, FNP 01/13/18 0013    Nita Sickle, MD 01/15/18 [phone]

## 2018-01-12 NOTE — ED Notes (Signed)
Pharmacy called again for medication

## 2018-01-16 ENCOUNTER — Other Ambulatory Visit: Payer: Self-pay | Admitting: Physician Assistant

## 2018-01-16 ENCOUNTER — Ambulatory Visit: Payer: Managed Care, Other (non HMO) | Admitting: Physician Assistant

## 2018-01-16 DIAGNOSIS — E1100 Type 2 diabetes mellitus with hyperosmolarity without nonketotic hyperglycemic-hyperosmolar coma (NKHHC): Secondary | ICD-10-CM

## 2018-01-16 DIAGNOSIS — E1165 Type 2 diabetes mellitus with hyperglycemia: Principal | ICD-10-CM

## 2018-01-16 NOTE — Telephone Encounter (Signed)
[facility] faxed refill request for the following medications:  metFORMIN (GLUCOPHAGE XR) 500 MG 24 hr tablet  Sig: take 2 tablets by mouth twice daily (dose increase)  Qty: 120  Please advise.

## 2018-01-17 ENCOUNTER — Ambulatory Visit: Payer: Managed Care, Other (non HMO) | Admitting: Physician Assistant

## 2018-01-17 ENCOUNTER — Encounter: Payer: Self-pay | Admitting: Physician Assistant

## 2018-01-17 VITALS — BP 126/88 | HR 79 | Temp 98.3°F | Wt 242.6 lb

## 2018-01-17 DIAGNOSIS — B373 Candidiasis of vulva and vagina: Secondary | ICD-10-CM

## 2018-01-17 DIAGNOSIS — E1165 Type 2 diabetes mellitus with hyperglycemia: Secondary | ICD-10-CM

## 2018-01-17 DIAGNOSIS — E1100 Type 2 diabetes mellitus with hyperosmolarity without nonketotic hyperglycemic-hyperosmolar coma (NKHHC): Secondary | ICD-10-CM | POA: Diagnosis not present

## 2018-01-17 DIAGNOSIS — B3731 Acute candidiasis of vulva and vagina: Secondary | ICD-10-CM

## 2018-01-17 MED ORDER — INSULIN DEGLUDEC 100 UNIT/ML ~~LOC~~ SOPN: PEN_INJECTOR | SUBCUTANEOUS | 1 refills | Status: DC

## 2018-01-17 MED ORDER — METFORMIN HCL ER 500 MG PO TB24: ORAL_TABLET | ORAL | 0 refills | Status: DC

## 2018-01-17 MED ORDER — FLUCONAZOLE 150 MG PO TABS: ORAL_TABLET | ORAL | 0 refills | Status: DC

## 2018-01-17 NOTE — Progress Notes (Signed)
Patient: Katie [name] Female    DOB: 1990/08/29   27 y.o.   MRN: [id number] Visit Date: 01/17/2018  Today's Provider: Trey Sailors, PA-C   CC: DM II Subjective:    HPI  Diabetes Mellitus Type II, Follow-up:   Lab Results  Component Value Date   HGBA1C 9.6 (A) 09/12/2017   HGBA1C 8.1 (H) 05/14/2017   Wt Readings from Last 3 Encounters:  01/17/18 242 lb 9.6 oz (110 kg)  01/12/18 240 lb (108.9 kg)  09/04/17 243 lb (110.2 kg)     Last seen for diabetes 4 months ago.  Management since then includes none. She reports good compliance with treatment. Says she has been "self treating" is not currently taking insulin. Was previously on tresiba but insurance did not pay. Never came back for follow up until now. Is taking victoza 1.8 mg subQ daily. Was instructed to titrate up to metformin XR 1000 mg BID but is only taking metformin xr 500 mg daily because she says it hurts her stomach. Has been having repeated yeast infections.  She is not having side effects.  Current symptoms include none and have been stable. Home blood sugar records: above 270 fasting. Has tried multiple diets but none are sustained.   Episodes of hypoglycemia? no    Current Insulin Regimen: none Most Recent Eye Exam: Not UTD, needs to schedule appointment. Reports previously receiving Pneumonia vaccine.  Weight trend: stable Prior visit with dietician: no Current diet: in general, an "unhealthy" diet Current exercise: walking  Pertinent Labs:    Component Value Date/Time   CHOL 148 05/14/2017 1609   TRIG 158 (H) 05/14/2017 1609   HDL 59 05/14/2017 1609   LDLCALC 57 05/14/2017 1609   CREATININE 0.74 01/12/2018 1902    Wt Readings from Last 3 Encounters:  01/17/18 242 lb 9.6 oz (110 kg)  01/12/18 240 lb (108.9 kg)  09/04/17 243 lb (110.2 kg)    ------------------------------------------------------------------------     No Known Allergies   Current Outpatient Medications:  .   etonogestrel-ethinyl estradiol (NUVARING) 0.12-0.015 MG/24HR vaginal ring, Insert vaginally and leave in place for 4 consecutive weeks, then remove and replace with a new ring., Disp: 1 each, Rfl: 11 .  fluconazole (DIFLUCAN) 150 MG tablet, Use 1 pill on day one. May take second pill three days later if still symptomatic., Disp: 2 tablet, Rfl: 0 .  levothyroxine (SYNTHROID, LEVOTHROID) 50 MCG tablet, Take 1 tablet (50 mcg total) by mouth daily., Disp: 90 tablet, Rfl: 1 .  liraglutide (VICTOZA) 18 MG/3ML SOPN, Inject 0.3 mLs (1.8 mg total) into the skin daily. Please schedule office visit before any future refills, Disp: 9 mL, Rfl: 0 .  metFORMIN (GLUCOPHAGE XR) 500 MG 24 hr tablet, Take 1000 mg twice daily. NEEDS TO SCHEDULE APPT BEFORE MORE REFILLS., Disp: 60 tablet, Rfl: 0 .  nystatin (MYCOSTATIN/NYSTOP) powder, Apply topically 3 (three) times daily., Disp: 15 g, Rfl: 0 .  Prenatal Vit-Fe Fumarate-FA (PRENATAL MULTIVITAMIN) TABS, Take 1 tablet by mouth daily., Disp: , Rfl:   Review of Systems  Constitutional: Negative.   HENT: Negative.   Eyes: Negative.   Gastrointestinal: Negative.   Endocrine: Negative.   Genitourinary: Negative.   Neurological: Negative.     Social History   Tobacco Use  . Smoking status: Never Smoker  . Smokeless tobacco: Never Used  Substance Use Topics  . Alcohol use: No   Objective:   BP 126/88 (BP Location: Right Arm, Patient Position:  Sitting, Cuff Size: Normal)   Pulse 79   Temp 98.3 F (36.8 C) (Oral)   Wt 242 lb 9.6 oz (110 kg)   SpO2 98%   BMI 44.37 kg/m  Vitals:   01/17/18 1633  BP: 126/88  Pulse: 79  Temp: 98.3 F (36.8 C)  TempSrc: Oral  SpO2: 98%  Weight: 242 lb 9.6 oz (110 kg)     Physical Exam  Constitutional: She is oriented to person, place, and time. She appears well-developed and well-nourished.  Cardiovascular: Normal rate and regular rhythm.  Pulmonary/Chest: Effort normal and breath sounds normal.  Neurological: She is  alert and oriented to person, place, and time.  Skin: Skin is warm and dry.  Psychiatric: She has a normal mood and affect. Her behavior is normal.        Assessment & Plan:     1. DM hyperosmolarity type II, uncontrolled (HCC)  Start at 10 units and increase as below. Needs to come back in one month.   - insulin degludec (TRESIBA FLEXTOUCH) 100 UNIT/ML SOPN FlexTouch Pen; Start on 10 units. Every third day increase by 2 units until you reach 20 units. Please check fasting sugars daily.  Dispense: 3 mL; Refill: 1 - HgB A1c - Comprehensive Metabolic Panel (CMET) - CBC with Differential - metFORMIN (GLUCOPHAGE XR) 500 MG 24 hr tablet; Take 1000 mg twice daily. NEEDS TO SCHEDULE APPT BEFORE MORE REFILLS.  Dispense: 180 tablet; Refill: 0  2. Vaginal candida  Patient is upset because she feels I did not call in yeast infection medication soon enough. She says she was under the impression that I would call it in for her without being seen. I do not recall this conversation and informed her it is not my practice to call in antibiotics/antifungals without assessing the patient. I last saw her for vaginal yeast infection 08/03/2017 and DM on 08/2017. I only called it in for her this time because she does use NuvaRing. I have informed her that it is my practice to evaluate patients before prescribing medications and that her recurrent yeast infections are secondary to her uncontrolled diabetes, which she has not been seen for in 5 months. I have emphasize importance of following up for her chronic illnesses, especially her diabetes. I will call in diflucan between now and her next evaluation if she has yeast infection symptoms, but this is not an ongoing agreement to call in medication without being seen when the patient desires.   - fluconazole (DIFLUCAN) 150 MG tablet; Take 1 pill on day 1 of symptoms. If symptoms persist for 3 days, may take 2nd pill.  Dispense: 2 tablet; Refill: 0  Return in about 1  month (around 02/16/2018) for DM .  The entirety of the information documented in the History of Present Illness, Review of Systems and Physical Exam were personally obtained by me. Portions of this information were initially documented by Dara Lords, CMA and reviewed by me for thoroughness and accuracy.           Trey Sailors, PA-C  [facility]

## 2018-01-17 NOTE — Patient Instructions (Signed)

## 2018-01-17 NOTE — Telephone Encounter (Signed)
I have given two weeks of medications. Needs to schedule and attend appt before more refills. She has cancelled last two appointments for diabetes. Diabetes remains uncontrolled.

## 2018-01-17 NOTE — Telephone Encounter (Signed)
Patient has appointment schedule for today 01/17/2018 @ 4:20pm.

## 2018-01-22 ENCOUNTER — Telehealth: Payer: Self-pay

## 2018-01-22 LAB — CBC WITH DIFFERENTIAL/PLATELET
Basophils Absolute: 0 10*3/uL (ref 0.0–0.2)
Basos: 0 %
EOS (ABSOLUTE): 0.1 10*3/uL (ref 0.0–0.4)
Eos: 1 %
Hematocrit: 38.6 % (ref 34.0–46.6)
Hemoglobin: 12.3 g/dL (ref 11.1–15.9)
Immature Grans (Abs): 0 10*3/uL (ref 0.0–0.1)
Immature Granulocytes: 0 %
Lymphocytes Absolute: 3.4 10*3/uL — ABNORMAL HIGH (ref 0.7–3.1)
Lymphs: 48 %
MCH: 26.4 pg — ABNORMAL LOW (ref 26.6–33.0)
MCHC: 31.9 g/dL (ref 31.5–35.7)
MCV: 83 fL (ref 79–97)
Monocytes Absolute: 0.4 10*3/uL (ref 0.1–0.9)
Monocytes: 5 %
Neutrophils Absolute: 3.3 10*3/uL (ref 1.4–7.0)
Neutrophils: 46 %
Platelets: 266 10*3/uL (ref 150–450)
RBC: 4.66 x10E6/uL (ref 3.77–5.28)
RDW: 12.3 % (ref 12.3–15.4)
WBC: 7.2 10*3/uL (ref 3.4–10.8)

## 2018-01-22 LAB — COMPREHENSIVE METABOLIC PANEL
ALT: 13 IU/L (ref 0–32)
AST: 20 IU/L (ref 0–40)
Albumin/Globulin Ratio: 1.4 (ref 1.2–2.2)
Albumin: 3.9 g/dL (ref 3.5–5.5)
Alkaline Phosphatase: 99 IU/L (ref 39–117)
BUN/Creatinine Ratio: 10 (ref 9–23)
BUN: 8 mg/dL (ref 6–20)
Bilirubin Total: 0.4 mg/dL (ref 0.0–1.2)
CO2: 20 mmol/L (ref 20–29)
Calcium: 9.1 mg/dL (ref 8.7–10.2)
Chloride: 101 mmol/L (ref 96–106)
Creatinine, Ser: 0.81 mg/dL (ref 0.57–1.00)
GFR calc Af Amer: 116 mL/min/{1.73_m2} (ref >59)
GFR calc non Af Amer: 101 mL/min/{1.73_m2} (ref >59)
Globulin, Total: 2.8 g/dL (ref 1.5–4.5)
Glucose: 422 mg/dL — ABNORMAL HIGH (ref 65–99)
Potassium: 4.6 mmol/L (ref 3.5–5.2)
Sodium: 135 mmol/L (ref 134–144)
Total Protein: 6.7 g/dL (ref 6.0–8.5)

## 2018-01-22 LAB — HEMOGLOBIN A1C
Est. average glucose Bld gHb Est-mCnc: 280 mg/dL
Hgb A1c MFr Bld: 11.4 % — ABNORMAL HIGH (ref 4.8–5.6)

## 2018-01-22 NOTE — Telephone Encounter (Signed)
01/22/18 2:10 pm

## 2018-01-22 NOTE — Telephone Encounter (Signed)
-----   Message from Trey SailorsAdriana M Pollak, [place]PA-C sent at 01/22/2018  2:09 PM EST ----- Labwork shows A1C up to 11.4, increased from 9.6% last month. Please have her continue to increase insulin every three days. Have her mychart message me in two weeks with fasting sugars and we can increase from there. Consider changing victoza at follow up in one month.

## 2018-01-22 NOTE — Telephone Encounter (Signed)
Patient has seen results and comment on mychart

## 2018-02-07 ENCOUNTER — Other Ambulatory Visit: Payer: Self-pay | Admitting: Physician Assistant

## 2018-02-07 DIAGNOSIS — E1100 Type 2 diabetes mellitus with hyperosmolarity without nonketotic hyperglycemic-hyperosmolar coma (NKHHC): Secondary | ICD-10-CM

## 2018-02-07 DIAGNOSIS — E1165 Type 2 diabetes mellitus with hyperglycemia: Principal | ICD-10-CM

## 2018-02-19 ENCOUNTER — Ambulatory Visit: Payer: Self-pay | Admitting: Physician Assistant

## 2018-02-19 NOTE — Progress Notes (Deleted)
       Patient: Katie [name] Female    DOB: [birth year]   27 y.o.   MRN: [id number] Visit Date: 02/19/2018  Today's Provider: Trey SailorsAdriana M Pollak, PA-C   No chief complaint on file.  Subjective:    HPI  Diabetes Mellitus hyperosmolarity Type II, Follow-up:   Lab Results  Component Value Date   HGBA1C 11.4 (H) 01/21/2018   HGBA1C 9.6 (A) 09/12/2017   HGBA1C 8.1 (H) 05/14/2017    Last seen for diabetes 1 months ago.  Management since then includes Tresiba flexTouch 100 units. Start on 10 units. Every third day increase by 2 units until you reach 20 units.  She reports {excellent/good/fair/poor:19665} compliance with treatment. She {ACTION; IS/IS [id number]} having side effects. *** Current symptoms include {Symptoms; diabetes:14075} and have been {Desc; course:15616}. Home blood sugar records: {diabetes glucometry results:16657}  Episodes of hypoglycemia? {yes***/no:17258}   Current Insulin Regimen: *** Most Recent Eye Exam: *** Prior visit with dietician: {yes/no:17258} Current diet: {diet habits:16563} Current exercise: {exercise types:16438}  Pertinent Labs:    Component Value Date/Time   CHOL 148 05/14/2017 1609   TRIG 158 (H) 05/14/2017 1609   HDL 59 05/14/2017 1609   LDLCALC 57 05/14/2017 1609   CREATININE 0.81 01/21/2018 1027    Wt Readings from Last 3 Encounters:  01/17/18 242 lb 9.6 oz (110 kg)  01/12/18 240 lb (108.9 kg)  09/04/17 243 lb (110.2 kg)    ------------------------------------------------------------------------     No Known Allergies   Current Outpatient Medications:  .  etonogestrel-ethinyl estradiol (NUVARING) 0.12-0.015 MG/24HR vaginal ring, Insert vaginally and leave in place for 4 consecutive weeks, then remove and replace with a new ring., Disp: 1 each, Rfl: 11 .  fluconazole (DIFLUCAN) 150 MG tablet, Take 1 pill on day 1 of symptoms. If symptoms persist for 3 days, may take 2nd pill., Disp: 2 tablet, Rfl: 0 .  insulin  degludec (TRESIBA FLEXTOUCH) 100 UNIT/ML SOPN FlexTouch Pen, Start on 10 units. Every third day increase by 2 units until you reach 20 units. Please check fasting sugars daily., Disp: 3 mL, Rfl: 1 .  levothyroxine (SYNTHROID, LEVOTHROID) 50 MCG tablet, Take 1 tablet (50 mcg total) by mouth daily., Disp: 90 tablet, Rfl: 1 .  liraglutide (VICTOZA) 18 MG/3ML SOPN, Inject 0.3 mLs (1.8 mg total) into the skin daily. Please schedule office visit before any future refills, Disp: 9 mL, Rfl: 0 .  metFORMIN (GLUCOPHAGE XR) 500 MG 24 hr tablet, Take 1000 mg twice daily. NEEDS TO SCHEDULE APPT BEFORE MORE REFILLS., Disp: 180 tablet, Rfl: 0 .  nystatin (MYCOSTATIN/NYSTOP) powder, Apply topically 3 (three) times daily., Disp: 15 g, Rfl: 0 .  Prenatal Vit-Fe Fumarate-FA (PRENATAL MULTIVITAMIN) TABS, Take 1 tablet by mouth daily., Disp: , Rfl:   Review of Systems  Social History   Tobacco Use  . Smoking status: Never Smoker  . Smokeless tobacco: Never Used  Substance Use Topics  . Alcohol use: No   Objective:   There were no vitals taken for this visit. There were no vitals filed for this visit.   Physical Exam      Assessment & Plan:           Trey SailorsAdriana M Pollak, PA-C  [facility]

## 2018-03-01 ENCOUNTER — Ambulatory Visit: Payer: Self-pay | Admitting: Physician Assistant

## 2018-03-01 ENCOUNTER — Ambulatory Visit: Payer: Managed Care, Other (non HMO) | Admitting: Physician Assistant

## 2018-03-01 ENCOUNTER — Encounter: Payer: Self-pay | Admitting: Physician Assistant

## 2018-03-01 VITALS — BP 129/83 | HR 78 | Temp 98.4°F | Wt 241.6 lb

## 2018-03-01 DIAGNOSIS — Z794 Long term (current) use of insulin: Secondary | ICD-10-CM

## 2018-03-01 DIAGNOSIS — E1165 Type 2 diabetes mellitus with hyperglycemia: Secondary | ICD-10-CM

## 2018-03-01 DIAGNOSIS — E1100 Type 2 diabetes mellitus with hyperosmolarity without nonketotic hyperglycemic-hyperosmolar coma (NKHHC): Secondary | ICD-10-CM

## 2018-03-01 LAB — POCT GLYCOSYLATED HEMOGLOBIN (HGB A1C): Hemoglobin A1C: 11.5 % — AB (ref 4.0–5.6)

## 2018-03-01 MED ORDER — INSULIN DEGLUDEC 100 UNIT/ML ~~LOC~~ SOPN: PEN_INJECTOR | SUBCUTANEOUS | 1 refills | Status: DC

## 2018-03-01 NOTE — Progress Notes (Signed)
Patient: Katie [name] Female    DOB: 31-Jan-1991   27 y.o.   MRN: [id number] Visit Date: 03/01/2018  Today's Provider: Trey SailorsAdriana M Pollak, PA-C   Chief Complaint  Patient presents with  . Diabetes   Subjective:     HPI  Diabetes Mellitus Type II, Follow-up:   Lab Results  Component Value Date   HGBA1C 11.5 (A) 03/01/2018   HGBA1C 11.4 (H) 01/21/2018   HGBA1C 9.6 (A) 09/12/2017    Last seen for diabetes 1 months ago.  Management since then includes start Tresiba 10 IU and titrate to twenty units. She reports good compliance with treatment. Averages 130 to 170 units tresiba daily. Currently taking 2000 mg metformin for unknown amount of time. Metformin 1000 mg BID. She is also on maximum dose of Victoza.  She is not having side effects.  Current symptoms include none  Home blood sugar records: fasting range: 120's  Episodes of hypoglycemia? no   Current Insulin Regimen: Tresiba  Most Recent Eye Exam: not UTD Weight trend: stable Prior visit with dietician: no Current diet: diabetic Current exercise: none  Pertinent Labs:    Component Value Date/Time   CHOL 148 05/14/2017 1609   TRIG 158 (H) 05/14/2017 1609   HDL 59 05/14/2017 1609   LDLCALC 57 05/14/2017 1609   CREATININE 0.81 01/21/2018 1027    Wt Readings from Last 3 Encounters:  03/01/18 241 lb 9.6 oz (109.6 kg)  01/17/18 242 lb 9.6 oz (110 kg)  01/12/18 240 lb (108.9 kg)   Eating Habits Breakfast: [employer] breakfast bars, muffins, [place] egg and cheese wrap from freezer aisle -  Snack: pack of chips, peanut butter crackers and peanuts Lunch: school lunch - chicken nuggets, green beans, and potatoes or left over from dinner - can drink 16-20 ounces of sweet tea at lunch Snack: movie theater popcorn or microwave popcorn Dinner: can be cooked at home - sometimes spaghetti, might also order pizza out.   ------------------------------------------------------------------------  No Known  Allergies   Current Outpatient Medications:  .  etonogestrel-ethinyl estradiol (NUVARING) 0.12-0.015 MG/24HR vaginal ring, Insert vaginally and leave in place for 4 consecutive weeks, then remove and replace with a new ring., Disp: 1 each, Rfl: 11 .  insulin degludec (TRESIBA FLEXTOUCH) 100 UNIT/ML SOPN FlexTouch Pen, Increase by 2 units every 3 days until taking 30 units daily or fasting sugars are between 110-120., Disp: 3 mL, Rfl: 1 .  Lancets (ONETOUCH DELICA PLUS LANCET33G) MISC, , Disp: , Rfl:  .  levothyroxine (SYNTHROID, LEVOTHROID) 50 MCG tablet, Take 1 tablet (50 mcg total) by mouth daily., Disp: 90 tablet, Rfl: 1 .  liraglutide (VICTOZA) 18 MG/3ML SOPN, Inject 0.3 mLs (1.8 mg total) into the skin daily. Please schedule office visit before any future refills, Disp: 9 mL, Rfl: 0 .  metFORMIN (GLUCOPHAGE XR) 500 MG 24 hr tablet, Take 1000 mg twice daily. NEEDS TO SCHEDULE APPT BEFORE MORE REFILLS., Disp: 180 tablet, Rfl: 0 .  nystatin (MYCOSTATIN/NYSTOP) powder, Apply topically 3 (three) times daily., Disp: 15 g, Rfl: 0 .  Prenatal Vit-Fe Fumarate-FA (PRENATAL MULTIVITAMIN) TABS, Take 1 tablet by mouth daily., Disp: , Rfl:    Review of Systems  Constitutional: Negative.   Respiratory: Negative.   Cardiovascular: Negative.   Musculoskeletal: Negative.     Social History   Tobacco Use  . Smoking status: Never Smoker  . Smokeless tobacco: Never Used  Substance Use Topics  . Alcohol use: No  Objective:   BP 129/83 (BP Location: Right Arm, Patient Position: Sitting, Cuff Size: Large)   Pulse 78   Temp 98.4 F (36.9 C) (Oral)   Wt 241 lb 9.6 oz (109.6 kg)   SpO2 99%   BMI 44.19 kg/m  Vitals:   03/01/18 1234  BP: 129/83  Pulse: 78  Temp: 98.4 F (36.9 C)  TempSrc: Oral  SpO2: 99%  Weight: 241 lb 9.6 oz (109.6 kg)     Physical Exam Constitutional:      Appearance: Normal appearance. She is obese.  Cardiovascular:     Rate and Rhythm: Normal rate and regular  rhythm.  Pulmonary:     Effort: Pulmonary effort is normal.  Skin:    General: Skin is warm and dry.  Neurological:     Mental Status: She is alert and oriented to person, place, and time. Mental status is at baseline.  Psychiatric:        Mood and Affect: Mood normal.        Behavior: Behavior normal.         Assessment & Plan    1. Type 2 diabetes mellitus with hyperosmolarity without coma, with long-term current use of insulin (HCC)  A1c today is 11.5, roughly the same as one month ago. I do think she is in a state of transition right now after starting these medications and her fasting sugars have come down significantly as they were around 270 previously. I think we can increase her Tresiba to 30 units slowly or until her fasting sugars are 110-120 consistently. We may have to begin checking meal time sugars and work on controlling post-prandial hyperglycemia.   Have had very extensive conversation about the role of dietary changes in controlling diabetes. Patient is morbidly obese with uncontrolled diabetes. Her diet as listed above is high in processed foods, starch, and added sugars and we have discussed the role of this in worsening sugar levels. I think it would be great for her to meet with CCM and work on strategies to improve control of her diabetes.   - POCT glycosylated hemoglobin (Hb A1C) - Ambulatory referral to Chronic Care Management Services  2. DM hyperosmolarity type II, uncontrolled (HCC)  - insulin degludec (TRESIBA FLEXTOUCH) 100 UNIT/ML SOPN FlexTouch Pen; Increase by 2 units every 3 days until taking 30 units daily or fasting sugars are between 110-120.  Dispense: 3 mL; Refill: 1  3. Morbid Obesity (HCC)  Discussed dietary interventions. Referring to CCM for more targeted management of her diabetes.   Return in about 3 months (around 05/31/2018) for DM II.  The entirety of the information documented in the History of Present Illness, Review of Systems and  Physical Exam were personally obtained by me. Portions of this information were initially documented by Hetty ElyJOseline ROsas, CMA and reviewed by me for thoroughness and accuracy.          Trey SailorsAdriana M Pollak, PA-C  [facility]

## 2018-03-08 ENCOUNTER — Ambulatory Visit: Payer: Self-pay | Admitting: Pharmacist

## 2018-03-08 DIAGNOSIS — E1100 Type 2 diabetes mellitus with hyperosmolarity without nonketotic hyperglycemic-hyperosmolar coma (NKHHC): Secondary | ICD-10-CM

## 2018-03-08 DIAGNOSIS — Z794 Long term (current) use of insulin: Principal | ICD-10-CM

## 2018-03-08 NOTE — Chronic Care Management (AMB) (Signed)
  Care Management   Note  03/08/2018 Name: Katie [name] MRN: [id number] DOB: Nov 09, 1990    27 y.o. year old female referred to Care Management by Osvaldo AngstAdriana Pollak, PA  for education surrounding diabetes and obesity. Chronic conditions include T2DM, obesity. Last office visit with Trey Sailorsollak, Adriana M, PA-C was 03/01/18.   Was unable to reach patient via telephone today and have left HIPAA compliant voicemail asking patient to return my call. (unsuccessful outreach #1).  Plan: Will follow-up within 3-5  business days via telephone.   Karalee HeightJulie Alynah Schone, PharmD Clinical Pharmacist [facility] [phone]

## 2018-03-11 ENCOUNTER — Ambulatory Visit: Payer: Self-pay | Admitting: Pharmacist

## 2018-03-11 DIAGNOSIS — Z794 Long term (current) use of insulin: Principal | ICD-10-CM

## 2018-03-11 DIAGNOSIS — E1100 Type 2 diabetes mellitus with hyperosmolarity without nonketotic hyperglycemic-hyperosmolar coma (NKHHC): Secondary | ICD-10-CM

## 2018-03-11 NOTE — Patient Instructions (Signed)
Ms. Stoney BangJefferies was given information about Care Management services today including:  1. Case Management services includes personalized support from designated clinical staff supervised by her physician, including individualized plan of care and coordination with other care providers 2. 24/7 contact phone numbers for assistance for urgent and routine care needs. 3. The patient may stop case management services at any time by phone call to the office staff.  Patient agreed to services and verbal consent obtained.

## 2018-03-11 NOTE — Chronic Care Management (AMB) (Signed)
  Care Management  Note  03/11/2018 Name: Sherrin Daisyracy Reiling MRN: [id number] DOB: 1990-11-16  Referred by: Trey SailorsPollak, Adriana M, PA-C Reason for referral : Chronic Care Management (Introduction of services)  Sherrin Daisyracy Grigg is a 27 y.o. year old female who sees Trey Sailorsollak, Adriana M, [place] for primary care. Adriana asked the CM team to consult the patient for assistance with chronic disease management related to diabetes. Referral was placed 03/01/18. Telephone outreach to patient today to introduce care management services.   Plan: [name] agreed that care management services would be helpful to him/her and verbal consent was obtained. I have scheduled an initial office visit for 03/14/18 at 2 pm  Ms. Stoney BangJefferies was given information about Care Management services today including:  1. Case Management services includes personalized support from designated clinical staff supervised by her physician, including individualized plan of care and coordination with other care providers 2. 24/7 contact phone numbers for assistance for urgent and routine care needs. 3. The patient may stop case management services at any time by phone call to the office staff.  Karalee HeightJulie Tal Neer, PharmD Clinical Pharmacist [facility] [phone]

## 2018-03-13 HISTORY — DX: Maternal care for unspecified type scar from previous cesarean delivery: O34.219

## 2018-03-14 ENCOUNTER — Ambulatory Visit: Payer: Managed Care, Other (non HMO) | Admitting: Pharmacist

## 2018-03-14 DIAGNOSIS — Z794 Long term (current) use of insulin: Principal | ICD-10-CM

## 2018-03-14 DIAGNOSIS — E1100 Type 2 diabetes mellitus with hyperosmolarity without nonketotic hyperglycemic-hyperosmolar coma (NKHHC): Secondary | ICD-10-CM

## 2018-03-26 LAB — HM DIABETES EYE EXAM

## 2018-03-30 ENCOUNTER — Other Ambulatory Visit: Payer: Self-pay | Admitting: Physician Assistant

## 2018-03-30 DIAGNOSIS — E1100 Type 2 diabetes mellitus with hyperosmolarity without nonketotic hyperglycemic-hyperosmolar coma (NKHHC): Secondary | ICD-10-CM

## 2018-03-30 DIAGNOSIS — E1165 Type 2 diabetes mellitus with hyperglycemia: Principal | ICD-10-CM

## 2018-04-10 ENCOUNTER — Other Ambulatory Visit: Payer: Self-pay | Admitting: Physician Assistant

## 2018-04-10 ENCOUNTER — Telehealth: Payer: Self-pay

## 2018-04-10 DIAGNOSIS — E1165 Type 2 diabetes mellitus with hyperglycemia: Principal | ICD-10-CM

## 2018-04-10 DIAGNOSIS — E1100 Type 2 diabetes mellitus with hyperosmolarity without nonketotic hyperglycemic-hyperosmolar coma (NKHHC): Secondary | ICD-10-CM

## 2018-04-10 NOTE — Patient Instructions (Addendum)
Diet Recommendations for Diabetes  Carbohydrate includes starch, sugar, and fiber.  Of these, only sugar and starch raise blood glucose.  (Fiber is found in fruits, vegetables [especially skin, seeds, and stalks] and whole grains.)   Starchy (carb) foods: Bread, rice, pasta, potatoes, corn, cereal, grits, crackers, bagels, muffins, all baked goods.  (Fruit, milk, and yogurt also have carbohydrate, but most of these foods will not spike your blood sugar as most starchy foods will.)  A few fruits do cause high blood sugars; use small portions of bananas (limit to 1/2 at a time), grapes, watermelon, oranges, and most tropical fruits.   Protein foods: Meat, fish, poultry, eggs, dairy foods, and beans such as pinto and kidney beans (beans also provide carbohydrate).   1. Eat at least REAL 3 meals and 1-2 snacks per day. Never go more than 4-5 hours while awake without eating. Eat breakfast within the first hour of getting up.   2. Limit starchy foods to TWO per meal and ONE per snack. ONE portion of a starchy  food is equal to the following:   - ONE slice of bread (or its equivalent, such as half of a hamburger bun).   - 1/2 cup of a "scoopable" starchy food such as potatoes or rice.   - 15 grams of Total Carbohydrate as shown on food label.   - Every 4 ounces of a sweet drink (including fruit juice) 3. Include at every meal: a protein food, a carb food, and vegetables and/or fruit.   - Obtain twice the volume of veg's as protein or carbohydrate foods for both lunch and dinner.   - Fresh or frozen veg's are best.   - Keep frozen veg's on hand for a quick vegetable serving.       Goals Addressed            This Visit's Progress   . "I don't want to be on insulin forever" (pt-stated)       Barriers:  1. Knowledge deficit   Pharmacist Clinical Goal(s): Over the next 30 days, patient will report proper technique for checking CBGs and injecting insulin.   Interventions: 1. Counseling on  diabetes disease process  2. Counseling on insulin and other diabetes medications and their "targets" 3. Counseling on how to check CBGs  Patient Self Care Activities:  1. Checks CBGs as prescribed 2. Administers insulin successfully   *initial goal documentation     . "I would like to learn about diet" (pt-stated)       Barriers: 1. Knowledge deficit and recent diagnosis of DM  Nurse Case Manager Clinical Goal(s): Over the next 30 days, patient will report utilizing diabetes diet discussed today at at least 75% of meals.   Interventions:  4. Provided extensive diet education, including examples 5. Obtain referral to nutrition classes and GI for food allergy testing  Patient Self Care Activities:  3. Follows plate method at least 5 days a week 4. Makes time to eat lunch at work and a good breakfast every morning  *initial goal documentation          Thank you allowing the Chronic Care Management Team to be a part of your care!   Please call a member of the CCM (Chronic Care Management) Team with any questions or case management needs:   Arthur Holms, BSN Nurse Care Coordinator  [phone]  Karalee Height, PharmD  Clinical Pharmacist  [phone]  The patient verbalized understanding of instructions provided today  and declined a print copy of patient instruction materials.   Katie [name] was given information about Care Management services today including:  1. Case Management services includes personalized support from designated clinical staff supervised by her physician, including individualized plan of care and coordination with other care providers 2. 24/7 contact phone numbers for assistance for urgent and routine care needs. 3. The patient may stop case management services at any time by phone call to the office staff.  Patient agreed to services and verbal consent obtained.

## 2018-04-10 NOTE — Chronic Care Management (AMB) (Signed)
Care Management   Initial Visit Note  04/10/2018 Name: Katie [name] MRN: [id number] DOB: 29-May-1990  Referred by: PCP, Rollen Soxr.Adriana Pollak Reason for referral : Diabetes education  Subjective: "I want to learn more about diabetes so that I can get healthy. I don't want to have complications or be on insulin forever"  Objective: Medications Reviewed Today    Reviewed by Andee PolesHedrick, Lauren Aguayo E, [facility] LLCRPH (Pharmacist) on 04/10/18 at 1632  Med List Status: <None>  Medication Order Taking? Sig Documenting Provider Last Dose Status Informant  etonogestrel-ethinyl estradiol (NUVARING) 0.12-0.015 MG/24HR vaginal ring [id number] Yes Insert vaginally and leave in place for 4 consecutive weeks, then remove and replace with a new ring. Trey SailorsPollak, Adriana M, PA-C Taking Active   insulin degludec (TRESIBA FLEXTOUCH) 100 UNIT/ML SOPN FlexTouch Pen [id number] Yes Increase by 2 units every 3 days until taking 30 units daily or fasting sugars are between 110-120. Trey SailorsPollak, Adriana M, PA-C Taking Active   Lancets [facility](ONETOUCH [place]ose KellsELICA PLUS [place] View[place]ANCET33G) MISC [id number] Yes  [provider] Taking Active   levothyroxine (SYNTHROID, LEVOTHROID) 50 MCG tablet [id number] Yes Take 1 tablet (50 mcg total) by mouth daily. Trey SailorsPollak, Adriana M, PA-C Taking Active   metFORMIN (GLUCOPHAGE-XR) 500 MG 24 hr tablet [id number]  TAKE 2 TABLETS BY MOUTH TWICE DAILY. NEEDS TO SCHEDULE APPOINTMENT BEFORE MORE REFILLS. Trey SailorsPollak, Adriana M, PA-C  Active   Prenatal Vit-Fe Fumarate-FA (PRENATAL MULTIVITAMIN) TABS [id number] No Take 1 tablet by mouth daily. [provider] Not Taking Active Self  VICTOZA 18 MG/3ML SOPN [id number]  INJECT 1.8 MG UNDER THE SKIN DAILY. Trey SailorsPollak, Adriana M, PA-C  Active            Assessment: Katie [name] is a 28 year old patient of Osvaldo Angstdriana Pollak, PA-C recently diagnosed with diabetes with an elevated A1c of 11.5%. She was started on Victoza (GLP-1) and Tresiba (insulin degludec) at that time.   Today, provided  extensive counseling on diabetes pathology, medication targets and mechanisms, and diabetes diet. Additionally provided counseling on hypoglycemia and what to do when blood sugar is below 70 mg/dL.   When discussing diet, patient mentioned that she experiences a lot of GI discomfort and distress with many foods, including many protein sources.   # of ED visits last 6 months: 1   Goals Addressed            This Visit's Progress   . "I don't want to be on insulin forever" (pt-stated)       Barriers:  1. Knowledge deficit   Pharmacist Clinical Goal(s): Over the next 30 days, patient will report proper technique for checking CBGs and injecting insulin.   Interventions: 1. Counseling on diabetes disease process  2. Counseling on insulin and other diabetes medications and their "targets" 3. Counseling on how to check CBGs  Patient Self Care Activities:  1. Checks CBGs as prescribed 2. Administers insulin successfully   *initial goal documentation     . "I would like to learn about diet" (pt-stated)       Barriers: 1. Knowledge deficit and recent diagnosis of DM  Nurse Case Manager Clinical Goal(s): Over the next 30 days, patient will report utilizing diabetes diet discussed today at at least 75% of meals.   Interventions:  4. Provided extensive diet education, including examples 5. Obtain referral to nutrition classes and GI for food allergy testing  Patient Self Care Activities:  3. Follows plate method at least 5 days a week 4. Makes time to eat lunch at work and  a good breakfast every morning  *initial goal documentation         Plan: Recommendations for provider:  - referral to GI in context of possible food allergies/GI discomfort - referral to medical nutrition classes for further diet education from a nutritionist   Follow up plan:  Telephone follow up appointment with CCM team member scheduled for: February 3 with [facility] and pharmacist  Ms. Natali was  given information about Care Management services today including:  1. Case Management services include personalized support from designated clinical staff supervised by a physician, including individualized plan of care and coordination with other care providers 2. 24/7 contact phone numbers for assistance for urgent and routine care needs. 3. The patient may stop CCM services at any time (effective at the end of the month) by phone call to the office staff.  Patient agreed to services and verbal consent obtained.  Karalee Height, PharmD Clinical Pharmacist [facility] [phone]

## 2018-04-12 ENCOUNTER — Other Ambulatory Visit: Payer: Self-pay | Admitting: Physician Assistant

## 2018-04-12 DIAGNOSIS — E1165 Type 2 diabetes mellitus with hyperglycemia: Principal | ICD-10-CM

## 2018-04-12 DIAGNOSIS — E1100 Type 2 diabetes mellitus with hyperosmolarity without nonketotic hyperglycemic-hyperosmolar coma (NKHHC): Secondary | ICD-10-CM

## 2018-04-15 ENCOUNTER — Ambulatory Visit: Payer: Self-pay | Admitting: Pharmacist

## 2018-04-15 ENCOUNTER — Telehealth: Payer: Self-pay

## 2018-04-15 DIAGNOSIS — E1100 Type 2 diabetes mellitus with hyperosmolarity without nonketotic hyperglycemic-hyperosmolar coma (NKHHC): Secondary | ICD-10-CM

## 2018-04-15 DIAGNOSIS — Z794 Long term (current) use of insulin: Principal | ICD-10-CM

## 2018-04-15 NOTE — Chronic Care Management (AMB) (Signed)
  Chronic Care Management   Follow Up Note   04/15/2018 Name: Karissa Bruesch MRN: [id number] DOB: August 17, 1990  Referred by: Trey Sailors, PA-C Reason for referral : Chronic Care Management (unsuccessful outreach)   28 y.o. year old female referred to Chronic Care Management by Osvaldo Angst- PA for diabetes disease education and management. Chronic conditions include diabetes, obesity. Last office visit with Trey Sailors, PA-C was 03/01/2018.  Initial CCM visit 03/14/2018 for diabetes education and diet counseling. Outreach today was a 30 day follow up to assess goals, adherence.    Was unable to reach patient via telephone today and have left HIPAA compliant voicemail asking patient to return my call. (unsuccessful outreach #1).  The CM team will reach out to the patient again over the next 7 days.    Karalee Height, PharmD Clinical Pharmacist [facility] [phone]

## 2018-04-17 ENCOUNTER — Telehealth: Payer: Self-pay

## 2018-04-19 ENCOUNTER — Telehealth: Payer: Self-pay

## 2018-04-22 ENCOUNTER — Ambulatory Visit: Payer: Self-pay | Admitting: Pharmacist

## 2018-04-22 ENCOUNTER — Telehealth: Payer: Self-pay

## 2018-04-22 DIAGNOSIS — Z794 Long term (current) use of insulin: Principal | ICD-10-CM

## 2018-04-22 DIAGNOSIS — E1100 Type 2 diabetes mellitus with hyperosmolarity without nonketotic hyperglycemic-hyperosmolar coma (NKHHC): Secondary | ICD-10-CM

## 2018-04-24 NOTE — Chronic Care Management (AMB) (Signed)
  Chronic Care Management   Note  04/24/2018 Name: Katie [name] MRN: [id number] DOB: 08/19/1990   28 y.o. year old female referred to Chronic Care Management by Osvaldo Angst, PA for newly diagnosed diabetes. Chronic conditions include T2DM, obesity . Last office visit with Trey Sailors, PA-C was 03/01/2018. Initial CCM office visit 03/14/18.    Was unable to reach patient via telephone today and have left HIPAA compliant voicemail asking patient to return my call. (unsuccessful outreach #2).  Purpose of today's call was one month follow up regarding blood sugars and education provided at initial office visit.   Follow up plan: The CM team will reach out to the patient again over the next 7 days.  and The patient has been provided with contact information for the chronic care management team and has been advised to call with any health related questions or concerns.   Karalee Height, PharmD Clinical Pharmacist [facility] [phone]

## 2018-04-26 ENCOUNTER — Telehealth: Payer: Self-pay

## 2018-05-07 ENCOUNTER — Other Ambulatory Visit: Payer: Self-pay | Admitting: Physician Assistant

## 2018-05-07 DIAGNOSIS — E1100 Type 2 diabetes mellitus with hyperosmolarity without nonketotic hyperglycemic-hyperosmolar coma (NKHHC): Secondary | ICD-10-CM

## 2018-05-07 DIAGNOSIS — E1165 Type 2 diabetes mellitus with hyperglycemia: Principal | ICD-10-CM

## 2018-05-08 ENCOUNTER — Telehealth: Payer: Self-pay | Admitting: Physician Assistant

## 2018-05-08 NOTE — Telephone Encounter (Signed)
Vernona Rieger with Cover My Meds called giving the PA Key [id number].  For the Victoza 18mg   CB# [phone] anyone who answers should be able to help you.  Thanks  [employer]

## 2018-05-10 ENCOUNTER — Telehealth: Payer: Self-pay | Admitting: Physician Assistant

## 2018-05-10 NOTE — Telephone Encounter (Signed)
Katie [name] with Cover My meds For Victosa.  She said she called the other day and has not heard anything back yet  Key # [id number]  Thanks Barth Kirks

## 2018-05-29 ENCOUNTER — Ambulatory Visit: Payer: BC Managed Care – PPO | Admitting: Physician Assistant

## 2018-05-29 ENCOUNTER — Other Ambulatory Visit: Payer: Self-pay

## 2018-05-29 ENCOUNTER — Encounter: Payer: Self-pay | Admitting: Physician Assistant

## 2018-05-29 VITALS — BP 122/78 | HR 71 | Temp 98.7°F | Resp 16 | Wt 251.2 lb

## 2018-05-29 DIAGNOSIS — Z794 Long term (current) use of insulin: Secondary | ICD-10-CM | POA: Diagnosis not present

## 2018-05-29 DIAGNOSIS — Z23 Encounter for immunization: Secondary | ICD-10-CM

## 2018-05-29 DIAGNOSIS — Z862 Personal history of diseases of the blood and blood-forming organs and certain disorders involving the immune mechanism: Secondary | ICD-10-CM

## 2018-05-29 DIAGNOSIS — E1100 Type 2 diabetes mellitus with hyperosmolarity without nonketotic hyperglycemic-hyperosmolar coma (NKHHC): Secondary | ICD-10-CM

## 2018-05-29 LAB — POCT GLYCOSYLATED HEMOGLOBIN (HGB A1C): Hemoglobin A1C: 7.3 % — AB (ref 4.0–5.6)

## 2018-05-29 NOTE — Progress Notes (Signed)
Patient: Katie [name] Female    DOB: Mar 23, 1990   28 y.o.   MRN: [id number] Visit Date: 05/31/2018  Today's Provider: Trey Sailors, PA-C   Chief Complaint  Patient presents with  . Diabetes   Subjective:     HPI  Diabetes Mellitus Type II, Follow-up:   Lab Results  Component Value Date   HGBA1C 7.3 (A) 05/29/2018   HGBA1C 11.5 (A) 03/01/2018   HGBA1C 11.4 (H) 01/21/2018    Last seen for diabetes 3 months ago.  Management since then includes none. She reports excellent compliance with treatment. She is not having side effects.  Current symptoms include increased appetite, fatigue and have been worsening. Home blood sugar records: not being regularly checked  Episodes of hypoglycemia? no   Current Insulin Regimen: 25 units [place] daily. She is currently also taking Metformin XR 1000 mg BID and victoza 1.8 mg daily.  Most Recent Eye Exam: 03/26/2018 Weight trend: fluctuating a bit Prior visit with dietician: Yes  Current exercise: walking Current diet habits: in general, an "unhealthy" diet  tresiba 25 units daily  [facility]  Pertinent Labs:    Component Value Date/Time   CHOL 148 05/14/2017 1609   TRIG 158 (H) 05/14/2017 1609   HDL 59 05/14/2017 1609   LDLCALC 57 05/14/2017 1609   CREATININE 0.81 01/21/2018 1027    Wt Readings from Last 3 Encounters:  05/29/18 251 lb 3.2 oz (113.9 kg)  03/01/18 241 lb 9.6 oz (109.6 kg)  01/17/18 242 lb 9.6 oz (110 kg)    ------------------------------------------------------------------------  No Known Allergies   Current Outpatient Medications:  .  etonogestrel-ethinyl estradiol (NUVARING) 0.12-0.015 MG/24HR vaginal ring, Insert vaginally and leave in place for 4 consecutive weeks, then remove and replace with a new ring., Disp: 1 each, Rfl: 11 .  Lancets (ONETOUCH DELICA PLUS LANCET33G) MISC, , Disp: , Rfl:  .  levothyroxine (SYNTHROID, LEVOTHROID) 50 MCG tablet, TAKE 1 TABLET BY MOUTH  DAILY, Disp: 90 tablet, Rfl: 0 .  metFORMIN (GLUCOPHAGE-XR) 500 MG 24 hr tablet, TAKE 2 TABLETS BY MOUTH TWICE DAILY. NEEDS TO SCHEDULE APPOINTMENT BEFORE MORE REFILLS., Disp: 180 tablet, Rfl: 0 .  TRESIBA FLEXTOUCH 100 UNIT/ML SOPN FlexTouch Pen, INCREASE BY 2 UNITS EVERY 3 DAYS UNTIL TAKING 30 UNITS DAILY OR FASTING SUGARS BETWEEN 110 TO 120, Disp: 3 mL, Rfl: 1 .  VICTOZA 18 MG/3ML SOPN, INJECT 1.8 MG UNDER THE SKIN DAILY., Disp: 27 mL, Rfl: 1 .  Prenatal Vit-Fe Fumarate-FA (PRENATAL MULTIVITAMIN) TABS, Take 1 tablet by mouth daily., Disp: , Rfl:   Review of Systems  Constitutional: Positive for fatigue.  HENT: Negative.   Respiratory: Negative.   Cardiovascular: Negative.   Gastrointestinal: Negative.   Endocrine: Positive for polyphagia. Negative for cold intolerance, heat intolerance, polydipsia and polyuria.  Genitourinary:       Patient reports abdominal cramping when passing bowel movement  Musculoskeletal: Negative.     Social History   Tobacco Use  . Smoking status: Never Smoker  . Smokeless tobacco: Never Used  Substance Use Topics  . Alcohol use: No      Objective:   BP 122/78   Pulse 71   Temp 98.7 F (37.1 C) (Oral)   Resp 16   Wt 251 lb 3.2 oz (113.9 kg)   BMI 45.95 kg/m  Vitals:   05/29/18 1004  BP: 122/78  Pulse: 71  Resp: 16  Temp: 98.7 F (37.1 C)  TempSrc: Oral  Weight:  251 lb 3.2 oz (113.9 kg)     Physical Exam Constitutional:      Appearance: Normal appearance.  Cardiovascular:     Rate and Rhythm: Normal rate and regular rhythm.     Heart sounds: Normal heart sounds.  Pulmonary:     Effort: Pulmonary effort is normal.     Breath sounds: Normal breath sounds.  Skin:    General: Skin is warm and dry.  Neurological:     Mental Status: She is alert and oriented to person, place, and time. Mental status is at baseline.  Psychiatric:        Mood and Affect: Mood normal.        Behavior: Behavior normal.     Diabetic Foot Exam -  Simple   Simple Foot Form Diabetic Foot exam was performed with the following findings:  Yes 05/29/2018  8:55 AM  Visual Inspection No deformities, no ulcerations, no other skin breakdown bilaterally:  Yes Sensation Testing Intact to touch and monofilament testing bilaterally:  Yes Pulse Check Posterior Tibialis and Dorsalis pulse intact bilaterally:  Yes Comments        Assessment & Plan    1. Type 2 diabetes mellitus with hyperosmolarity without coma, with long-term current use of insulin (HCC)  A1c much improved today. Patient has increased exercise. Encouraged continued dietary and exercise modifications. Will check A1c in 3 mo. She reports she has eye exam in the past year and we are requesting these records. F/u 3 mo.   - POCT glycosylated hemoglobin (Hb A1C) - Urine Microalbumin w/creat. ratio - Pneumococcal polysaccharide vaccine 23-valent greater than or equal to 2yo subcutaneous/IM  2. History of anemia  - CBC with Differential - Fe+TIBC+Fer  3. Need for pneumococcal vaccination  - Pneumococcal polysaccharide vaccine 23-valent greater than or equal to 2yo subcutaneous/IM      Trey Sailors, PA-C  [facility]

## 2018-05-31 NOTE — Patient Instructions (Signed)
Diabetes Mellitus and Exercise Exercising regularly is important for your overall health, especially when you have diabetes (diabetes mellitus). Exercising is not only about losing weight. It has many other health benefits, such as increasing muscle strength and bone density and reducing body fat and stress. This leads to improved fitness, flexibility, and endurance, all of which result in better overall health. Exercise has additional benefits for people with diabetes, including:  Reducing appetite.  Helping to lower and control blood glucose.  Lowering blood pressure.  Helping to control amounts of fatty substances (lipids) in the blood, such as cholesterol and triglycerides.  Helping the body to respond better to insulin (improving insulin sensitivity).  Reducing how much insulin the body needs.  Decreasing the risk for heart disease by: ? Lowering cholesterol and triglyceride levels. ? Increasing the levels of good cholesterol. ? Lowering blood glucose levels. What is my activity plan? Your health care provider or certified diabetes educator can help you make a plan for the type and frequency of exercise (activity plan) that works for you. Make sure that you:  Do at least 150 minutes of moderate-intensity or vigorous-intensity exercise each week. This could be brisk walking, biking, or water aerobics. ? Do stretching and strength exercises, such as yoga or weightlifting, at least 2 times a week. ? Spread out your activity over at least 3 days of the week.  Get some form of physical activity every day. ? Do not go more than 2 days in a row without some kind of physical activity. ? Avoid being inactive for more than 30 minutes at a time. Take frequent breaks to walk or stretch.  Choose a type of exercise or activity that you enjoy, and set realistic goals.  Start slowly, and gradually increase the intensity of your exercise over time. What do I need to know about managing my  diabetes?   Check your blood glucose before and after exercising. ? If your blood glucose is 240 mg/dL (13.3 mmol/L) or higher before you exercise, check your urine for ketones. If you have ketones in your urine, do not exercise until your blood glucose returns to normal. ? If your blood glucose is 100 mg/dL (5.6 mmol/L) or lower, eat a snack containing 15-20 grams of carbohydrate. Check your blood glucose 15 minutes after the snack to make sure that your level is above 100 mg/dL (5.6 mmol/L) before you start your exercise.  Know the symptoms of low blood glucose (hypoglycemia) and how to treat it. Your risk for hypoglycemia increases during and after exercise. Common symptoms of hypoglycemia can include: ? Hunger. ? Anxiety. ? Sweating and feeling clammy. ? Confusion. ? Dizziness or feeling light-headed. ? Increased heart rate or palpitations. ? Blurry vision. ? Tingling or numbness around the mouth, lips, or tongue. ? Tremors or shakes. ? Irritability.  Keep a rapid-acting carbohydrate snack available before, during, and after exercise to help prevent or treat hypoglycemia.  Avoid injecting insulin into areas of the body that are going to be exercised. For example, avoid injecting insulin into: ? The arms, when playing tennis. ? The legs, when jogging.  Keep records of your exercise habits. Doing this can help you and your health care provider adjust your diabetes management plan as needed. Write down: ? Food that you eat before and after you exercise. ? Blood glucose levels before and after you exercise. ? The type and amount of exercise you have done. ? When your insulin is expected to peak, if you use   insulin. Avoid exercising at times when your insulin is peaking.  When you start a new exercise or activity, work with your health care provider to make sure the activity is safe for you, and to adjust your insulin, medicines, or food intake as needed.  Drink plenty of water while  you exercise to prevent dehydration or heat stroke. Drink enough fluid to keep your urine clear or pale yellow. Summary  Exercising regularly is important for your overall health, especially when you have diabetes (diabetes mellitus).  Exercising has many health benefits, such as increasing muscle strength and bone density and reducing body fat and stress.  Your health care provider or certified diabetes educator can help you make a plan for the type and frequency of exercise (activity plan) that works for you.  When you start a new exercise or activity, work with your health care provider to make sure the activity is safe for you, and to adjust your insulin, medicines, or food intake as needed. This information is not intended to replace advice given to you by your health care provider. Make sure you discuss any questions you have with your health care provider. Document Released: 05/20/2003 Document Revised: 09/07/2016 Document Reviewed: 08/09/2015 Elsevier Interactive Patient Education  2019 Elsevier Inc.  

## 2018-06-04 ENCOUNTER — Ambulatory Visit: Payer: Managed Care, Other (non HMO) | Admitting: Physician Assistant

## 2018-06-09 ENCOUNTER — Other Ambulatory Visit: Payer: Self-pay | Admitting: Physician Assistant

## 2018-06-09 DIAGNOSIS — Z3009 Encounter for other general counseling and advice on contraception: Secondary | ICD-10-CM

## 2018-06-09 DIAGNOSIS — E1100 Type 2 diabetes mellitus with hyperosmolarity without nonketotic hyperglycemic-hyperosmolar coma (NKHHC): Secondary | ICD-10-CM

## 2018-06-09 DIAGNOSIS — E1165 Type 2 diabetes mellitus with hyperglycemia: Principal | ICD-10-CM

## 2018-06-10 ENCOUNTER — Other Ambulatory Visit: Payer: Self-pay | Admitting: Physician Assistant

## 2018-06-10 DIAGNOSIS — Z794 Long term (current) use of insulin: Principal | ICD-10-CM

## 2018-06-10 DIAGNOSIS — E1100 Type 2 diabetes mellitus with hyperosmolarity without nonketotic hyperglycemic-hyperosmolar coma (NKHHC): Secondary | ICD-10-CM

## 2018-06-10 MED ORDER — METFORMIN HCL ER 500 MG PO TB24: 1000.0000 mg | ORAL_TABLET | Freq: Two times a day (BID) | ORAL | 0 refills | Status: DC

## 2018-06-10 NOTE — Telephone Encounter (Signed)
Please advise 

## 2018-06-12 ENCOUNTER — Other Ambulatory Visit: Payer: Self-pay

## 2018-06-12 DIAGNOSIS — E1100 Type 2 diabetes mellitus with hyperosmolarity without nonketotic hyperglycemic-hyperosmolar coma (NKHHC): Secondary | ICD-10-CM

## 2018-06-12 DIAGNOSIS — Z794 Long term (current) use of insulin: Principal | ICD-10-CM

## 2018-06-12 MED ORDER — METFORMIN HCL ER 500 MG PO TB24: 1000.0000 mg | ORAL_TABLET | Freq: Two times a day (BID) | ORAL | 0 refills | Status: DC

## 2018-06-13 ENCOUNTER — Other Ambulatory Visit: Payer: Self-pay | Admitting: Physician Assistant

## 2018-06-13 DIAGNOSIS — E1100 Type 2 diabetes mellitus with hyperosmolarity without nonketotic hyperglycemic-hyperosmolar coma (NKHHC): Secondary | ICD-10-CM

## 2018-06-13 DIAGNOSIS — E1165 Type 2 diabetes mellitus with hyperglycemia: Principal | ICD-10-CM

## 2018-06-13 NOTE — Telephone Encounter (Signed)
Please Review

## 2018-06-27 ENCOUNTER — Encounter: Payer: Self-pay | Admitting: Physician Assistant

## 2018-07-15 ENCOUNTER — Telehealth: Payer: Self-pay | Admitting: Physician Assistant

## 2018-07-15 DIAGNOSIS — Z3009 Encounter for other general counseling and advice on contraception: Secondary | ICD-10-CM

## 2018-07-15 NOTE — Telephone Encounter (Signed)
L.O.V. 05/29/2018, please advise.

## 2018-07-15 NOTE — Telephone Encounter (Signed)
[facility] faxed refill request for the following medications:  ELURYNG 0.12-0.015 MG/24HR vaginal ring [   Please advise.

## 2018-07-16 MED ORDER — ETONOGESTREL-ETHINYL ESTRADIOL 0.12-0.015 MG/24HR VA RING: 1.0000 | VAGINAL_RING | VAGINAL | 3 refills | Status: DC

## 2018-08-29 ENCOUNTER — Other Ambulatory Visit: Payer: Self-pay

## 2018-08-29 ENCOUNTER — Ambulatory Visit: Payer: BC Managed Care – PPO | Admitting: Physician Assistant

## 2018-08-29 ENCOUNTER — Encounter: Payer: Self-pay | Admitting: Physician Assistant

## 2018-08-29 VITALS — BP 114/79 | HR 85 | Temp 99.0°F | Wt 255.0 lb

## 2018-08-29 DIAGNOSIS — E1100 Type 2 diabetes mellitus with hyperosmolarity without nonketotic hyperglycemic-hyperosmolar coma (NKHHC): Secondary | ICD-10-CM

## 2018-08-29 DIAGNOSIS — Z308 Encounter for other contraceptive management: Secondary | ICD-10-CM

## 2018-08-29 DIAGNOSIS — Z794 Long term (current) use of insulin: Secondary | ICD-10-CM

## 2018-08-29 LAB — POCT GLYCOSYLATED HEMOGLOBIN (HGB A1C): Hemoglobin A1C: 7.2 % — AB (ref 4.0–5.6)

## 2018-08-29 NOTE — Patient Instructions (Signed)
Diabetes Mellitus and Exercise Exercising regularly is important for your overall health, especially when you have diabetes (diabetes mellitus). Exercising is not only about losing weight. It has many other health benefits, such as increasing muscle strength and bone density and reducing body fat and stress. This leads to improved fitness, flexibility, and endurance, all of which result in better overall health. Exercise has additional benefits for people with diabetes, including:  Reducing appetite.  Helping to lower and control blood glucose.  Lowering blood pressure.  Helping to control amounts of fatty substances (lipids) in the blood, such as cholesterol and triglycerides.  Helping the body to respond better to insulin (improving insulin sensitivity).  Reducing how much insulin the body needs.  Decreasing the risk for heart disease by: ? Lowering cholesterol and triglyceride levels. ? Increasing the levels of good cholesterol. ? Lowering blood glucose levels. What is my activity plan? Your health care provider or certified diabetes educator can help you make a plan for the type and frequency of exercise (activity plan) that works for you. Make sure that you:  Do at least 150 minutes of moderate-intensity or vigorous-intensity exercise each week. This could be brisk walking, biking, or water aerobics. ? Do stretching and strength exercises, such as yoga or weightlifting, at least 2 times a week. ? Spread out your activity over at least 3 days of the week.  Get some form of physical activity every day. ? Do not go more than 2 days in a row without some kind of physical activity. ? Avoid being inactive for more than 30 minutes at a time. Take frequent breaks to walk or stretch.  Choose a type of exercise or activity that you enjoy, and set realistic goals.  Start slowly, and gradually increase the intensity of your exercise over time. What do I need to know about managing my  diabetes?   Check your blood glucose before and after exercising. ? If your blood glucose is 240 mg/dL (13.3 mmol/L) or higher before you exercise, check your urine for ketones. If you have ketones in your urine, do not exercise until your blood glucose returns to normal. ? If your blood glucose is 100 mg/dL (5.6 mmol/L) or lower, eat a snack containing 15-20 grams of carbohydrate. Check your blood glucose 15 minutes after the snack to make sure that your level is above 100 mg/dL (5.6 mmol/L) before you start your exercise.  Know the symptoms of low blood glucose (hypoglycemia) and how to treat it. Your risk for hypoglycemia increases during and after exercise. Common symptoms of hypoglycemia can include: ? Hunger. ? Anxiety. ? Sweating and feeling clammy. ? Confusion. ? Dizziness or feeling light-headed. ? Increased heart rate or palpitations. ? Blurry vision. ? Tingling or numbness around the mouth, lips, or tongue. ? Tremors or shakes. ? Irritability.  Keep a rapid-acting carbohydrate snack available before, during, and after exercise to help prevent or treat hypoglycemia.  Avoid injecting insulin into areas of the body that are going to be exercised. For example, avoid injecting insulin into: ? The arms, when playing tennis. ? The legs, when jogging.  Keep records of your exercise habits. Doing this can help you and your health care provider adjust your diabetes management plan as needed. Write down: ? Food that you eat before and after you exercise. ? Blood glucose levels before and after you exercise. ? The type and amount of exercise you have done. ? When your insulin is expected to peak, if you use   insulin. Avoid exercising at times when your insulin is peaking.  When you start a new exercise or activity, work with your health care provider to make sure the activity is safe for you, and to adjust your insulin, medicines, or food intake as needed.  Drink plenty of water while  you exercise to prevent dehydration or heat stroke. Drink enough fluid to keep your urine clear or pale yellow. Summary  Exercising regularly is important for your overall health, especially when you have diabetes (diabetes mellitus).  Exercising has many health benefits, such as increasing muscle strength and bone density and reducing body fat and stress.  Your health care provider or certified diabetes educator can help you make a plan for the type and frequency of exercise (activity plan) that works for you.  When you start a new exercise or activity, work with your health care provider to make sure the activity is safe for you, and to adjust your insulin, medicines, or food intake as needed. This information is not intended to replace advice given to you by your health care provider. Make sure you discuss any questions you have with your health care provider. Document Released: 05/20/2003 Document Revised: 09/07/2016 Document Reviewed: 08/09/2015 Elsevier Interactive Patient Education  2019 Elsevier Inc.  

## 2018-08-29 NOTE — Progress Notes (Signed)
Patient: Katie [name] Female    DOB: 05-17-1990   28 y.o.   MRN: [id number] Visit Date: 09/06/2018  Today's Provider: Trey SailorsAdriana M Verenice Westrich, PA-C   Chief Complaint  Patient presents with  . Diabetes   Subjective:    I, Porsha McClurkin CMA, am acting as a Neurosurgeonscribe for Ash[place]driana Malichi Palardy,PA-C.   HPI  Diabetes Mellitus Type II, Follow-up:   Lab Results  Component Value Date   HGBA1C 7.2 (A) 08/29/2018   HGBA1C 7.3 (A) 05/29/2018   HGBA1C 11.5 (A) 03/01/2018    Last seen for diabetes 3 months ago.  Management since then includes none. She reports good compliance with treatment. Currently taking metformin xr 1000 mg BID She is not having side effects.  Current symptoms include none and have been stable. Home blood sugar records: none  Episodes of hypoglycemia? no   Current Insulin Regimen: yes Most Recent Eye Exam: 03/26/2018 Weight trend: fluctuating a bit Prior visit with dietician: No Current exercise: walking Current diet habits: well balanced  Reports she already knows what to do but is not practicing certain habits like portion control, more veggies and fruits. She was contacted by our CCM team but reports this was not very helpful to her. Reports she is still heavy on carbs. She has decreased red meat and dairy. She is not drinking soda. She is drinking a large sweet tea 3-4 times a week as part of a mcd[employer] meal which includes a ten piece chicken nugget and medium fries. Eats bread frequently, pasta 2-3 times per week. Not drinking juice, gatorade, or eating corn. Feels like she is bottomless pit and doesn't have control over eating. Feels guilty after eating, has felt this way since childhood.   Pertinent Labs:    Component Value Date/Time   CHOL 148 05/14/2017 1609   TRIG 158 (H) 05/14/2017 1609   HDL 59 05/14/2017 1609   LDLCALC 57 05/14/2017 1609   CREATININE 0.81 01/21/2018 1027   Morbid Obesity: Weight is up 14 pounds in six months.   Wt Readings from  Last 3 Encounters:  08/29/18 255 lb (115.7 kg)  05/29/18 251 lb 3.2 oz (113.9 kg)  03/01/18 241 lb 9.6 oz (109.6 kg)   Eye Exam: [facility] [place]    Contracpetion: Removed nuvaring, previously had been wearing it continuously to avoid menstrual cycle. Reports sex drive improved after removal. Reports she has been inconsistent condom use, pills gave migraines. Took it out in April and LMP was 08/13/2018. ------------------------------------------------------------------------    No Known Allergies   Current Outpatient Medications:  .  etonogestrel-ethinyl estradiol (ELURYNG) 0.12-0.015 MG/24HR vaginal ring, Place 1 each vaginally every 28 (twenty-eight) days. Insert vaginally and leave in place for 3 consecutive weeks, then remove for 1 week., Disp: 3 each, Rfl: 3 .  insulin degludec (TRESIBA FLEXTOUCH) 100 UNIT/ML SOPN FlexTouch Pen, Inject 0.25 mLs (25 Units total) into the skin daily., Disp: 24 mL, Rfl: 0 .  Lancets (ONETOUCH DELICA PLUS LANCET33G) MISC, , Disp: , Rfl:  .  levothyroxine (SYNTHROID, LEVOTHROID) 50 MCG tablet, TAKE 1 TABLET BY MOUTH DAILY, Disp: 90 tablet, Rfl: 0 .  metFORMIN (GLUCOPHAGE XR) 500 MG 24 hr tablet, Take 2 tablets (1,000 mg total) by mouth 2 (two) times daily., Disp: 360 tablet, Rfl: 0 .  Prenatal Vit-Fe Fumarate-FA (PRENATAL MULTIVITAMIN) TABS, Take 1 tablet by mouth daily., Disp: , Rfl:  .  VICTOZA 18 MG/3ML SOPN, INJECT 1.8 MG UNDER THE SKIN DAILY., Disp:  27 mL, Rfl: 1  Review of Systems  Constitutional: Negative.   Respiratory: Negative.   Genitourinary: Negative.   Neurological: Negative.     Social History   Tobacco Use  . Smoking status: Never Smoker  . Smokeless tobacco: Never Used  Substance Use Topics  . Alcohol use: No      Objective:   BP 114/79 (BP Location: Right Arm, Patient Position: Sitting, Cuff Size: Large)   Pulse 85   Temp 99 F (37.2 C) (Oral)   Wt 255 lb (115.7 kg)   LMP 08/13/2018   SpO2 98%   BMI  46.64 kg/m  Vitals:   08/29/18 0941  BP: 114/79  Pulse: 85  Temp: 99 F (37.2 C)  TempSrc: Oral  SpO2: 98%  Weight: 255 lb (115.7 kg)     Physical Exam Constitutional:      Appearance: Normal appearance.  Cardiovascular:     Rate and Rhythm: Normal rate and regular rhythm.     Heart sounds: Normal heart sounds.  Pulmonary:     Effort: Pulmonary effort is normal.     Breath sounds: Normal breath sounds.  Abdominal:     General: Bowel sounds are normal. There is no distension.     Tenderness: There is no abdominal tenderness.  Skin:    General: Skin is warm and dry.  Neurological:     Mental Status: She is alert.  Psychiatric:        Mood and Affect: Mood normal.        Behavior: Behavior normal.         Assessment & Plan    1. Type 2 diabetes mellitus with hyperosmolarity without coma, with long-term current use of insulin ([place])  Patient not adherent to diabetic diet, A1c relatively stable. Keep medicines the same for now. May need to change metformin XR due to certain batch recall.   - Urine Microalbumin w/creat. ratio - POCT glycosylated hemoglobin (Hb A1C)  2. Morbid obesity ([place])  Weight has increased 14 lbs, not adherent to diabetic diet as listed in HPI. Discussed vyvanse for binge eating disorder. She would like to try sea moss first, which she has read is a natural appetite suppressant.   3. Encounter for other contraceptive management  Patient does not desire pregnancy currently. Counseled that every unprotected sexual encounter can lead to pregnancy. Victoza in particular is not safe in pregnancy. She does not want an alternative form of birth control.   I have spent 25 minutes with this patient, >50% of which was spent on counseling and coordination of care.  The entirety of the information documented in the History of Present Illness, Review of Systems and Physical Exam were personally obtained by me. Portions of this information were initially  documented by Lynford Humphrey, CMA and reviewed by me for thoroughness and accuracy.   F/u 3 months for DM II      Trinna Post, PA-C  [facility]

## 2018-08-30 LAB — MICROALBUMIN / CREATININE URINE RATIO
Creatinine, Urine: 164.9 mg/dL
Microalb/Creat Ratio: 2 mg/g creat (ref 0–29)
Microalbumin, Urine: 3.4 ug/mL

## 2018-09-06 ENCOUNTER — Encounter: Payer: Self-pay | Admitting: Physician Assistant

## 2018-09-30 ENCOUNTER — Other Ambulatory Visit: Payer: Self-pay | Admitting: Physician Assistant

## 2018-09-30 DIAGNOSIS — E1100 Type 2 diabetes mellitus with hyperosmolarity without nonketotic hyperglycemic-hyperosmolar coma (NKHHC): Secondary | ICD-10-CM

## 2018-10-03 ENCOUNTER — Other Ambulatory Visit: Payer: Self-pay | Admitting: Physician Assistant

## 2018-10-03 DIAGNOSIS — E1100 Type 2 diabetes mellitus with hyperosmolarity without nonketotic hyperglycemic-hyperosmolar coma (NKHHC): Secondary | ICD-10-CM

## 2018-10-16 ENCOUNTER — Other Ambulatory Visit: Payer: Self-pay | Admitting: Physician Assistant

## 2018-11-08 ENCOUNTER — Other Ambulatory Visit: Payer: Self-pay | Admitting: Physician Assistant

## 2018-11-08 ENCOUNTER — Encounter: Payer: Self-pay | Admitting: Physician Assistant

## 2018-11-08 ENCOUNTER — Other Ambulatory Visit: Payer: Self-pay

## 2018-11-08 ENCOUNTER — Ambulatory Visit: Payer: BC Managed Care – PPO | Admitting: Physician Assistant

## 2018-11-08 VITALS — BP 129/89 | HR 89 | Temp 97.1°F | Resp 16 | Ht 63.0 in | Wt 261.0 lb

## 2018-11-08 DIAGNOSIS — Z3490 Encounter for supervision of normal pregnancy, unspecified, unspecified trimester: Secondary | ICD-10-CM

## 2018-11-08 DIAGNOSIS — E1100 Type 2 diabetes mellitus with hyperosmolarity without nonketotic hyperglycemic-hyperosmolar coma (NKHHC): Secondary | ICD-10-CM | POA: Diagnosis not present

## 2018-11-08 DIAGNOSIS — Z794 Long term (current) use of insulin: Secondary | ICD-10-CM

## 2018-11-08 DIAGNOSIS — N912 Amenorrhea, unspecified: Secondary | ICD-10-CM | POA: Diagnosis not present

## 2018-11-08 MED ORDER — NOVOFINE 30G X 8 MM MISC: 1.0000 | Freq: Every day | 1 refills | Status: DC

## 2018-11-08 NOTE — Addendum Note (Signed)
Addended by: Trinna Post on: 11/08/2018 04:49 PM   Modules accepted: Orders

## 2018-11-08 NOTE — Patient Instructions (Signed)
First Trimester of Pregnancy ° °The first trimester of pregnancy is from week 1 until the end of week 13 (months 1 through 3). During this time, your baby will begin to develop inside you. At 6-8 weeks, the eyes and face are formed, and the heartbeat can be seen on ultrasound. At the end of 12 weeks, all the baby's organs are formed. Prenatal care is all the medical care you receive before the birth of your baby. Make sure you get good prenatal care and follow all of your doctor's instructions. °Follow these instructions at home: °Medicines °· Take over-the-counter and prescription medicines only as told by your doctor. Some medicines are safe and some medicines are not safe during pregnancy. °· Take a prenatal vitamin that contains at least 600 micrograms (mcg) of folic acid. °· If you have trouble pooping (constipation), take medicine that will make your stool soft (stool softener) if your doctor approves. °Eating and drinking ° °· Eat regular, healthy meals. °· Your doctor will tell you the amount of weight gain that is right for you. °· Avoid raw meat and uncooked cheese. °· If you feel sick to your stomach (nauseous) or throw up (vomit): °? Eat 4 or 5 small meals a day instead of 3 large meals. °? Try eating a few soda crackers. °? Drink liquids between meals instead of during meals. °· To prevent constipation: °? Eat foods that are high in fiber, like fresh fruits and vegetables, whole grains, and beans. °? Drink enough fluids to keep your pee (urine) clear or pale yellow. °Activity °· Exercise only as told by your doctor. Stop exercising if you have cramps or pain in your lower belly (abdomen) or low back. °· Do not exercise if it is too hot, too humid, or if you are in a place of great height (high altitude). °· Try to avoid standing for long periods of time. Move your legs often if you must stand in one place for a long time. °· Avoid heavy lifting. °· Wear low-heeled shoes. Sit and stand up  straight. °· You can have sex unless your doctor tells you not to. °Relieving pain and discomfort °· Wear a good support bra if your breasts are sore. °· Take warm water baths (sitz baths) to soothe pain or discomfort caused by hemorrhoids. Use hemorrhoid cream if your doctor says it is okay. °· Rest with your legs raised if you have leg cramps or low back pain. °· If you have puffy, bulging veins (varicose veins) in your legs: °? Wear support hose or compression stockings as told by your doctor. °? Raise (elevate) your feet for 15 minutes, 3-4 times a day. °? Limit salt in your food. °Prenatal care °· Schedule your prenatal visits by the twelfth week of pregnancy. °· Write down your questions. Take them to your prenatal visits. °· Keep all your prenatal visits as told by your doctor. This is important. °Safety °· Wear your seat belt at all times when driving. °· Make a list of emergency phone numbers. The list should include numbers for family, friends, the hospital, and police and fire departments. °General instructions °· Ask your doctor for a referral to a local prenatal class. Begin classes no later than at the start of month 6 of your pregnancy. °· Ask for help if you need counseling or if you need help with nutrition. Your doctor can give you advice or tell you where to go for help. °· Do not use hot tubs, steam   rooms, or saunas. °· Do not douche or use tampons or scented sanitary pads. °· Do not cross your legs for long periods of time. °· Avoid all herbs and alcohol. Avoid drugs that are not approved by your doctor. °· Do not use any tobacco products, including cigarettes, chewing tobacco, and electronic cigarettes. If you need help quitting, ask your doctor. You may get counseling or other support to help you quit. °· Avoid cat litter boxes and soil used by cats. These carry germs that can cause birth defects in the baby and can cause a loss of your baby (miscarriage) or stillbirth. °· Visit your dentist.  At home, brush your teeth with a soft toothbrush. Be gentle when you floss. °Contact a doctor if: °· You are dizzy. °· You have mild cramps or pressure in your lower belly. °· You have a nagging pain in your belly area. °· You continue to feel sick to your stomach, you throw up, or you have watery poop (diarrhea). °· You have a bad smelling fluid coming from your vagina. °· You have pain when you pee (urinate). °· You have increased puffiness (swelling) in your face, hands, legs, or ankles. °Get help right away if: °· You have a fever. °· You are leaking fluid from your vagina. °· You have spotting or bleeding from your vagina. °· You have very bad belly cramping or pain. °· You gain or lose weight rapidly. °· You throw up blood. It may look like coffee grounds. °· You are around people who have German measles, fifth disease, or chickenpox. °· You have a very bad headache. °· You have shortness of breath. °· You have any kind of trauma, such as from a fall or a car accident. °Summary °· The first trimester of pregnancy is from week 1 until the end of week 13 (months 1 through 3). °· To take care of yourself and your unborn baby, you will need to eat healthy meals, take medicines only if your doctor tells you to do so, and do activities that are safe for you and your baby. °· Keep all follow-up visits as told by your doctor. This is important as your doctor will have to ensure that your baby is healthy and growing well. °This information is not intended to replace advice given to you by your health care provider. Make sure you discuss any questions you have with your health care provider. °Document Released: 08/16/2007 Document Revised: 06/20/2018 Document Reviewed: 03/07/2016 °Elsevier Patient Education © 2020 [employer]vier Inc. ° °

## 2018-11-08 NOTE — Progress Notes (Signed)
Patient: Katie [name] Female    DOB: [birth year]   28 y.o.   MRN: [id number] Visit Date: 11/08/2018  Today's Provider: Trey SailorsAdriana M Azarion Hove, PA-C   Chief Complaint  Patient presents with  . Follow-up  . Diabetes   Subjective:     HPI   Patient presents today with amenorrhea and positive home pregnancy test. Last menstrual period 09/30/2018. Prior visit 08/2018 she had discontinued using NuvaRing due to reduced sex drive and declined additional forms of birth control. Pregnancy unintentional. Denies nausea, vomiting, abdominal pain, vaginal bleeding. This is her third pregnancy. Her first pregnancy in 2013 ended with delivery at 23 weeks of a [place] [facility]. Her second pregnancy resulted in her 26five year old twins. She has a history of cervical incompetence.   Her pregnancy is complicated by morbid obesity and history of uncontrolled diabetes. She is currently taking basal insulin, metformin XR and victoza.   She is not using alcohol or smoking. She is not using drugs. She is not taking a prenatal vitamin.    Diabetes Mellitus Type II, Follow-up:   Lab Results  Component Value Date   HGBA1C 7.2 (A) 08/29/2018   HGBA1C 7.3 (A) 05/29/2018   HGBA1C 11.5 (A) 03/01/2018   Last seen for diabetes 2 months ago.  Management since then includes; no changes. She reports good compliance with treatment. She is not having side effects. none Current symptoms include none and have been stable. Home blood sugar records: fasting range: not checking  Episodes of hypoglycemia? no   Current Insulin Regimen: n/a Most Recent Eye Exam: 03/26/2018 Weight trend: fluctuating a bit Prior visit with dietician: no Current diet: well balanced Current exercise: walking  Pregnancy:   1000 mg twice daily Stop victoza   30 units  Last period 10/08/2018  Stop victoza   [facility] child at [facility]     -----------------------------------------------------------   No Known Allergies   Current Outpatient Medications:  .  EUTHYROX 50 MCG tablet, Take 1 tablet by mouth once daily, Disp: 90 tablet, Rfl: 1 .  Lancets (ONETOUCH DELICA PLUS LANCET33G) MISC, , Disp: , Rfl:  .  metFORMIN (GLUCOPHAGE XR) 500 MG 24 hr tablet, Take 2 tablets (1,000 mg total) by mouth 2 (two) times daily., Disp: 360 tablet, Rfl: 0 .  Prenatal Vit-Fe Fumarate-FA (PRENATAL MULTIVITAMIN) TABS, Take 1 tablet by mouth daily., Disp: , Rfl:  .  TRESIBA FLEXTOUCH 100 UNIT/ML SOPN FlexTouch Pen, INJECT 25 UNITS INTO THE SKIN DAILY, Disp: 24 mL, Rfl: 0 .  VICTOZA 18 MG/3ML SOPN, INJECT 1.8 MG UNDER THE SKIN DAILY, Disp: 27 mL, Rfl: 1 .  etonogestrel-ethinyl estradiol (ELURYNG) 0.12-0.015 MG/24HR vaginal ring, Place 1 each vaginally every 28 (twenty-eight) days. Insert vaginally and leave in place for 3 consecutive weeks, then remove for 1 week. (Patient not taking: Reported on 11/08/2018), Disp: 3 each, Rfl: 3  Review of Systems  Constitutional: Negative for appetite change, chills, fatigue and fever.  Respiratory: Negative for chest tightness and shortness of breath.   Cardiovascular: Negative for chest pain and palpitations.  Gastrointestinal: Negative for abdominal pain, nausea and vomiting.  Neurological: Negative for dizziness and weakness.    Social History   Tobacco Use  . Smoking status: Never Smoker  . Smokeless tobacco: Never Used  Substance Use Topics  . Alcohol use: No      Objective:   BP 129/89 (BP Location: Right Arm, Patient Position: Sitting, Cuff Size:  Large)   Pulse 89   Temp (!) 97.1 F (36.2 C) (Other (Comment))   Resp 16   Ht 5\' 3"  (1.6 m)   Wt 261 lb (118.4 kg)   LMP 08/13/2018   SpO2 98%   BMI 46.23 kg/m  Vitals:   11/08/18 1355  BP: 129/89  Pulse: 89  Resp: 16  Temp: (!) 97.1 F (36.2 C)  TempSrc: Other (Comment)  SpO2: 98%  Weight: 261 lb (118.4 kg)  Height: 5\' 3"  (1.6  m)     Physical Exam Constitutional:      Appearance: Normal appearance.  Cardiovascular:     Rate and Rhythm: Normal rate and regular rhythm.     Heart sounds: Normal heart sounds.  Pulmonary:     Effort: Pulmonary effort is normal.     Breath sounds: Normal breath sounds.  Skin:    General: Skin is warm and dry.  Neurological:     Mental Status: She is alert and oriented to person, place, and time. Mental status is at baseline.  Psychiatric:        Mood and Affect: Mood normal.        Behavior: Behavior normal.      No results found for any visits on 11/08/18.     Assessment & Plan    1. Type 2 diabetes mellitus with hyperosmolarity without coma, with long-term current use of insulin ([place])  Discontinue victoza. Observe sugars for 1-2 weeks and begin titrating insulin 2 units every three days to achieve fasting sugar of 120. Continue metformin.   - Ambulatory referral to Obstetrics / Gynecology - Insulin Pen Needle (NOVOFINE) 30G X 8 MM MISC; Inject 10 each into the skin daily.  Dispense: 90 each; Refill: 1  2. Pregnancy, unspecified gestational age  Will refer to [place].   - Ambulatory referral to Obstetrics / Gynecology  3. Morbid obesity ([place])  - Ambulatory referral to Obstetrics / Gynecology  4. Amenorrhea  - TSH - B-HCG Quant - Beta HCG, Quant  The entirety of the information documented in the History of Present Illness, Review of Systems and Physical Exam were personally obtained by me. Portions of this information were initially documented by April M. Sabra Heck, CMA and reviewed by me for thoroughness and accuracy.       Trinna Post, PA-C  [facility]

## 2018-11-09 LAB — TSH: TSH: 2.77 u[IU]/mL (ref 0.450–4.500)

## 2018-11-09 LAB — BETA HCG QUANT (REF LAB): hCG Quant: 479 m[IU]/mL

## 2018-11-12 ENCOUNTER — Other Ambulatory Visit: Payer: Self-pay | Admitting: Physician Assistant

## 2018-11-12 ENCOUNTER — Encounter: Payer: Self-pay | Admitting: Physician Assistant

## 2018-11-12 DIAGNOSIS — E1100 Type 2 diabetes mellitus with hyperosmolarity without nonketotic hyperglycemic-hyperosmolar coma (NKHHC): Secondary | ICD-10-CM

## 2018-11-12 DIAGNOSIS — Z794 Long term (current) use of insulin: Secondary | ICD-10-CM

## 2018-11-12 MED ORDER — METFORMIN HCL ER 500 MG PO TB24: 1000.0000 mg | ORAL_TABLET | Freq: Two times a day (BID) | ORAL | 0 refills | Status: DC

## 2018-11-12 MED ORDER — NOVOFINE 30G X 8 MM MISC: 1.0000 | Freq: Every day | 1 refills | Status: DC

## 2018-11-12 NOTE — Progress Notes (Signed)
Resent generic insulin needles.

## 2018-11-15 ENCOUNTER — Other Ambulatory Visit: Payer: Self-pay

## 2018-11-15 ENCOUNTER — Encounter: Payer: Self-pay | Admitting: Obstetrics and Gynecology

## 2018-11-15 ENCOUNTER — Ambulatory Visit (INDEPENDENT_AMBULATORY_CARE_PROVIDER_SITE_OTHER): Payer: Managed Care, Other (non HMO) | Admitting: Obstetrics and Gynecology

## 2018-11-15 ENCOUNTER — Other Ambulatory Visit (HOSPITAL_COMMUNITY)
Admission: RE | Admit: 2018-11-15 | Discharge: 2018-11-15 | Disposition: A | Discharge status: Home / Self Care | Payer: BC Managed Care – PPO | Source: Ambulatory Visit | Attending: Obstetrics and Gynecology | Admitting: Obstetrics and Gynecology

## 2018-11-15 VITALS — BP 120/80 | Wt 265.0 lb

## 2018-11-15 DIAGNOSIS — O34219 Maternal care for unspecified type scar from previous cesarean delivery: Secondary | ICD-10-CM

## 2018-11-15 DIAGNOSIS — E1165 Type 2 diabetes mellitus with hyperglycemia: Secondary | ICD-10-CM | POA: Insufficient documentation

## 2018-11-15 DIAGNOSIS — O0991 Supervision of high risk pregnancy, unspecified, first trimester: Secondary | ICD-10-CM | POA: Insufficient documentation

## 2018-11-15 DIAGNOSIS — Z3A01 Less than 8 weeks gestation of pregnancy: Secondary | ICD-10-CM

## 2018-11-15 DIAGNOSIS — O24111 Pre-existing diabetes mellitus, type 2, in pregnancy, first trimester: Secondary | ICD-10-CM | POA: Insufficient documentation

## 2018-11-15 DIAGNOSIS — E039 Hypothyroidism, unspecified: Secondary | ICD-10-CM | POA: Insufficient documentation

## 2018-11-15 DIAGNOSIS — O24119 Pre-existing diabetes mellitus, type 2, in pregnancy, unspecified trimester: Secondary | ICD-10-CM | POA: Insufficient documentation

## 2018-11-15 DIAGNOSIS — O99281 Endocrine, nutritional and metabolic diseases complicating pregnancy, first trimester: Secondary | ICD-10-CM

## 2018-11-15 DIAGNOSIS — Z6841 Body Mass Index (BMI) 40.0 and over, adult: Secondary | ICD-10-CM | POA: Insufficient documentation

## 2018-11-15 DIAGNOSIS — O99211 Obesity complicating pregnancy, first trimester: Secondary | ICD-10-CM | POA: Insufficient documentation

## 2018-11-15 HISTORY — DX: Supervision of high risk pregnancy, unspecified, first trimester: O09.91

## 2018-11-15 NOTE — Progress Notes (Signed)
New Obstetric Patient H&P   Chief Complaint: "Desires prenatal care"   History of Present Illness: Patient is a 28 y.o. [id number] Not Hispanic or Latino female, LMP 10/07/2018 presents with amenorrhea and positive home pregnancy test. Based on her  LMP, her EDD is Estimated Date of Delivery: 07/14/19 and her EGA is [redacted]w[redacted]d. Cycles are 6. days, regular, and occur approximately every : 28 days. Her last pap smear was an unknown time ago and was no abnormalities.    She had a urine pregnancy test which was positive 1 week(s)  ago. Her last menstrual period was normal and lasted for  6 day(s). Since her LMP she claims she has experienced no issues. She denies vaginal bleeding. Her past medical history is notable for T2DM, hypothyroid. Her prior pregnancies are notable for G1 in 2013 she had a preterm delivery of a singleton at 23 weeks and ended in neonatal demise.  With G2 she had di-di twins with a shortened at 23 weeks.  She went on to deliver at 29 weeks via SVD with twin A and c-section with twin B.  The twins are alive and healthy.  She had gestational diabetes with G2 that never went away. So, she has a diagnosis of Type 2 diabetes.    Since her LMP, she admits to the use of tobacco products: no She claims she has gained 5 pounds since the start of her pregnancy.  There are cats in the home in the home  no  She admits close contact with children on a regular basis  yes  She has had chicken pox in the past yes She has had Tuberculosis exposures, symptoms, or previously tested positive for TB   no Current or past history of domestic violence. no  Genetic Screening/Teratology Counseling: (Includes patient, baby's father, or anyone in either family with:)   1. Patient's age >/= 66 [facility]  no 2. Thalassemia ([place], [place], Mediterranean, or Asian background): MCV<80  no 3. Neural tube defect (meningomyelocele, spina bifida, anencephaly)  no 4. Congenital heart defect  no  5. Down syndrome  no 6.  Tay-Sachs (Jewish, [place]  no 7. Canavan's Disease  no 8. Sickle cell disease or trait (African)  no  9. Hemophilia or other blood disorders  no  10. Muscular dystrophy  no  11. Cystic fibrosis  no  12. Huntington's Chorea  no  13. Mental retardation/autism  no 14. Other inherited genetic or chromosomal disorder  no 15. Maternal metabolic disorder (DM, PKU, etc)  no 16. Patient or FOB with a child with a birth defect not listed above no  16a. Patient or FOB with a birth defect themselves no 17. Recurrent pregnancy loss, or stillbirth  no  18. Any medications since LMP other than prenatal vitamins (include vitamins, supplements, OTC meds, drugs, alcohol)  Metformin, Triseba, levothyroxine (was on Victoza, but stopped when she found out she was pregnant), PNV 19. Any other genetic/environmental exposure to discuss  no  Infection History:   1. Lives with someone with TB or TB exposed  no  2. Patient or partner has history of genital herpes  no 3. Rash or viral illness since LMP  no 4. History of STI (GC, CT, HPV, syphilis, HIV)  no 5. History of recent travel :  no  Other pertinent information:  no   T2DM: She checks her BG each morning.  She her fasting BG values. She has been getting 170s. She has been instructed to increase her insulin  by 2 units every 3 days until she gets a target fasting of 120 or less consistently.  She was getting 130s on Victoza. She is not checking after meals.   Review of Systems:10 point review of systems negative unless otherwise noted in HPI  Past Medical History:  Diagnosis Date  . Diabetes mellitus without complication ([place])   . Thyroid disease     Past Surgical History:  Procedure Laterality Date  . CESAREAN SECTION      Gynecologic History: Patient's last menstrual period was 10/07/2018 (exact date).  Obstetric History: G3P0202 G1 - 2013: preterm delivery of a singleton at 23 weeks and ended in neonatal demise.   G2 - 2015: di-di  twins with a shortened cervix at 23 weeks.  She went on to deliver at 29 weeks via SVD with twin A and c-section with twin B.  The twins are alive and healthy.    Family History  Problem Relation Age of Onset  . Diabetes Mother   . Hypertension Mother   . Diabetes Father   . Kidney disease Maternal Grandmother   . Leukemia Maternal Grandfather   . Diabetes Paternal Grandmother   . Breast cancer Maternal Aunt 71       has contact    Social History   Socioeconomic History  . Marital status: Married    Spouse name: Not on file  . Number of children: Not on file  . Years of education: Not on file  . Highest education level: Not on file  Occupational History  . Not on file  Social Needs  . Financial resource strain: Not on file  . Food insecurity    Worry: Not on file    Inability: Not on file  . Transportation needs    Medical: Not on file    Non-medical: Not on file  Tobacco Use  . Smoking status: Never Smoker  . Smokeless tobacco: Never Used  Substance and Sexual Activity  . Alcohol use: No  . Drug use: No  . Sexual activity: Yes    Partners: Male    Birth control/protection: None  Lifestyle  . Physical activity    Days per week: Not on file    Minutes per session: Not on file  . Stress: Not on file  Relationships  . Social Herbalist on phone: Not on file    Gets together: Not on file    Attends religious service: Not on file    Active member of club or organization: Not on file    Attends meetings of clubs or organizations: Not on file    Relationship status: Not on file  . Intimate partner violence    Fear of current or ex partner: Not on file    Emotionally abused: Not on file    Physically abused: Not on file    Forced sexual activity: Not on file  Other Topics Concern  . Not on file  Social History Narrative  . Not on file    No Known Allergies  Prior to Admission medications   Medication Sig Start Date End Date Taking? Authorizing  Provider  EUTHYROX 50 MCG tablet Take 1 tablet by mouth once daily 10/16/18  Yes Pollak, Adriana M, PA-C  Insulin Pen Needle (NOVOFINE) 30G X 8 MM MISC Inject 10 each into the skin daily. 11/12/18  Yes Carles Collet M, PA-C  Lancets (ONETOUCH DELICA PLUS NIOEVO35K) [place]  01/22/18  Yes [provider]  metFORMIN (GLUCOPHAGE XR)  500 MG 24 hr tablet Take 2 tablets (1,000 mg total) by mouth 2 (two) times daily. 11/12/18 02/10/19 Yes Trey SailorsPollak, Adriana M, PA-C  Prenatal Vit-Fe Fumarate-FA (PRENATAL MULTIVITAMIN) TABS Take 1 tablet by mouth daily.   Yes [provider]  TRESIBA FLEXTOUCH 100 UNIT/ML SOPN FlexTouch Pen INJECT 25 UNITS INTO THE SKIN DAILY 09/30/18  Yes Maryella Shiversollak, Adriana M, PA-C    Physical Exam BP 120/80   Wt 265 lb (120.2 kg)   LMP 10/07/2018 (Exact Date)   BMI 46.94 kg/m   Physical Exam Vitals signs reviewed. Exam conducted with a chaperone present.  Constitutional:      General: She is not in acute distress.    Appearance: Normal appearance. She is obese.  HENT:     Head: Normocephalic and atraumatic.  Eyes:     General: No scleral icterus.    Conjunctiva/sclera: Conjunctivae normal.  Neck:     Musculoskeletal: Normal range of motion. No neck rigidity.  Cardiovascular:     Rate and Rhythm: Normal rate and regular rhythm.     Heart sounds: No friction rub. No gallop.   Pulmonary:     Effort: Pulmonary effort is normal.     Breath sounds: Normal breath sounds. No wheezing, rhonchi or rales.  Abdominal:     General: There is no distension.     Palpations: Abdomen is soft. There is no mass.     Tenderness: There is no abdominal tenderness. There is no guarding.  Genitourinary:    General: Normal vulva.     Exam position: Lithotomy position.     Tanner stage (genital): 5.     Labia:        Right: No rash, tenderness, lesion or injury.        Left: No rash, tenderness, lesion or injury.      Vagina: Normal.     Cervix: Normal.     Uterus: Normal.       Adnexa: Right adnexa normal and left adnexa normal.     Comments: Exam limited by body habitus Musculoskeletal: Normal range of motion.        General: No swelling.  Skin:    General: Skin is warm and dry.     Coloration: Skin is not jaundiced.  Neurological:     General: No focal deficit present.     Mental Status: She is alert and oriented to person, place, and time.     Cranial Nerves: No cranial nerve deficit.  Psychiatric:        Mood and Affect: Mood normal.        Behavior: Behavior normal.        Judgment: Judgment normal.      Female Chaperone present during breast and/or pelvic exam.   Assessment: 28 y.o. G3P0101 at 7451w4d presenting to initiate prenatal care  Plan: 1) Avoid alcoholic beverages. 2) Patient encouraged not to smoke.  3) Discontinue the use of all non-medicinal drugs and chemicals.  4) Take prenatal vitamins daily.  5) Nutrition, food safety (fish, cheese advisories, and high nitrite foods) and exercise discussed. 6) Hospital and practice style discussed with cross coverage system.  7) Genetic Screening, such as with 1st Trimester Screening, cell free fetal DNA, AFP testing, and Ultrasound, as well as with amniocentesis and CVS as appropriate, is discussed with patient. At the conclusion of today's visit patient did not have counseling yet about genetic testing. This will wait until after her ultrasound.  8) Patient is asked about travel  to areas at risk for the Zika virus, and counseled to avoid travel and exposure to mosquitoes or sexual partners who may have themselves been exposed to the virus. Testing is discussed, and will be ordered as appropriate.   U/s for dating/viability MFM consult after viability Baseline labs today (CMP, UPC Ratio, CBC) bASA after 12 weeks Pap/gc-ct today ophtho appt in January. So, is up to date.  Fetal ECHO @ 22 weeks Will likely need endocrinology involvement as she is already relying on insulin for control. She is not  in control of her diabetes and we discussed careful tracking of her blood glucose values over the next week (four times per day, fasting and 2 hour post-prandial) to allow us to make adjustments to her medication dosing.   Thomasene MohairStephen Carrye Goller, MD 11/15/2018 11:15 AM

## 2018-11-16 LAB — RPR+RH+ABO+RUB AB+AB SCR+CB...
Antibody Screen: NEGATIVE
HIV Screen 4th Generation wRfx: NONREACTIVE
Hematocrit: 36.6 % (ref 34.0–46.6)
Hemoglobin: 11.6 g/dL (ref 11.1–15.9)
Hepatitis B Surface Ag: NEGATIVE
MCH: 26.4 pg — ABNORMAL LOW (ref 26.6–33.0)
MCHC: 31.7 g/dL (ref 31.5–35.7)
MCV: 83 fL (ref 79–97)
Platelets: 353 10*3/uL (ref 150–450)
RBC: 4.39 x10E6/uL (ref 3.77–5.28)
RDW: 13 % (ref 11.7–15.4)
RPR Ser Ql: NONREACTIVE
Rh Factor: POSITIVE
Rubella Antibodies, IGG: 5.05 index (ref >0.99)
Varicella zoster IgG: 2565 index (ref >165)
WBC: 10.6 10*3/uL (ref 3.4–10.8)

## 2018-11-16 LAB — COMPREHENSIVE METABOLIC PANEL
ALT: 14 IU/L (ref 0–32)
AST: 13 IU/L (ref 0–40)
Albumin/Globulin Ratio: 1.4 (ref 1.2–2.2)
Albumin: 4.1 g/dL (ref 3.9–5.0)
Alkaline Phosphatase: 76 IU/L (ref 39–117)
BUN/Creatinine Ratio: 11 (ref 9–23)
BUN: 8 mg/dL (ref 6–20)
Bilirubin Total: 0.3 mg/dL (ref 0.0–1.2)
CO2: 21 mmol/L (ref 20–29)
Calcium: 9.1 mg/dL (ref 8.7–10.2)
Chloride: 100 mmol/L (ref 96–106)
Creatinine, Ser: 0.74 mg/dL (ref 0.57–1.00)
GFR calc Af Amer: 128 mL/min/{1.73_m2} (ref >59)
GFR calc non Af Amer: 111 mL/min/{1.73_m2} (ref >59)
Globulin, Total: 2.9 g/dL (ref 1.5–4.5)
Glucose: 183 mg/dL — ABNORMAL HIGH (ref 65–99)
Potassium: 4 mmol/L (ref 3.5–5.2)
Sodium: 137 mmol/L (ref 134–144)
Total Protein: 7 g/dL (ref 6.0–8.5)

## 2018-11-17 LAB — PROTEIN / CREATININE RATIO, URINE
Creatinine, Urine: 32 mg/dL
Protein, Ur: <4 mg/dL

## 2018-11-17 LAB — URINE CULTURE: Organism ID, Bacteria: NO GROWTH

## 2018-11-21 ENCOUNTER — Ambulatory Visit (INDEPENDENT_AMBULATORY_CARE_PROVIDER_SITE_OTHER): Payer: BC Managed Care – PPO

## 2018-11-21 ENCOUNTER — Other Ambulatory Visit: Payer: Self-pay

## 2018-11-21 ENCOUNTER — Ambulatory Visit (INDEPENDENT_AMBULATORY_CARE_PROVIDER_SITE_OTHER): Payer: BC Managed Care – PPO | Admitting: Obstetrics & Gynecology

## 2018-11-21 VITALS — BP 120/80 | Wt 263.0 lb

## 2018-11-21 DIAGNOSIS — N8312 Corpus luteum cyst of left ovary: Secondary | ICD-10-CM | POA: Diagnosis not present

## 2018-11-21 DIAGNOSIS — Z6841 Body Mass Index (BMI) 40.0 and over, adult: Secondary | ICD-10-CM

## 2018-11-21 DIAGNOSIS — O34219 Maternal care for unspecified type scar from previous cesarean delivery: Secondary | ICD-10-CM

## 2018-11-21 DIAGNOSIS — Z3A01 Less than 8 weeks gestation of pregnancy: Secondary | ICD-10-CM | POA: Diagnosis not present

## 2018-11-21 DIAGNOSIS — O24111 Pre-existing diabetes mellitus, type 2, in pregnancy, first trimester: Secondary | ICD-10-CM

## 2018-11-21 DIAGNOSIS — E039 Hypothyroidism, unspecified: Secondary | ICD-10-CM

## 2018-11-21 DIAGNOSIS — O99281 Endocrine, nutritional and metabolic diseases complicating pregnancy, first trimester: Secondary | ICD-10-CM

## 2018-11-21 DIAGNOSIS — O0991 Supervision of high risk pregnancy, unspecified, first trimester: Secondary | ICD-10-CM

## 2018-11-21 DIAGNOSIS — O3481 Maternal care for other abnormalities of pelvic organs, first trimester: Secondary | ICD-10-CM

## 2018-11-21 LAB — POCT URINALYSIS DIPSTICK OB
Glucose, UA: NEGATIVE
POC,PROTEIN,UA: NEGATIVE

## 2018-11-21 LAB — CYTOLOGY - PAP
Chlamydia: NEGATIVE
Diagnosis: NEGATIVE
Neisseria Gonorrhea: NEGATIVE

## 2018-11-21 NOTE — Patient Instructions (Signed)
Type 1 or Type 2 Diabetes Mellitus During Pregnancy, Diagnosis Type 1 diabetes (type 1 diabetes mellitus) and type 2 diabetes (type 2 diabetes mellitus) are long-term (chronic) diseases. Your diabetes may be caused by one or both of these problems:  Your pancreas does not make enough of a hormone called insulin.  Your pancreas does not respond in a normal way to insulin that it makes. Insulin lets sugars (glucose) go into cells in the body. This gives you energy. If you have diabetes, sugars cannot get into cells. This causes high blood sugar (hyperglycemia). If diabetes is treated, it may not hurt you or your baby. Your doctor will set treatment goals for you. In general, you should have these blood sugar levels:  After not eating for a long time (fasting): 95 mg/dL (5.3 mmol/L).  After meals (postprandial): ? One hour after a meal: at or below 140 mg/dL (7.8 mmol/L). ? Two hours after a meal: at or below 120 mg/dL (6.7 mmol/L).  A1c (hemoglobin A1c) level: 6-6.5%. Follow these instructions at home: Questions to ask your doctor  You may want to ask these questions: ? Do I need to meet with a diabetes educator? ? Where can I find a support group for people with diabetes? ? What equipment will I need to care for myself at home? ? What medicines do I need? When should I take them? ? How often do I need to check my blood sugar? ? What number can I call if I have questions? ? When is my next doctor's visit? General instructions  Take over-the-counter and prescription medicines only as told by your doctor.  Stay at a healthy weight during pregnancy.  Keep all follow-up visits as told by your doctor. This is important. Contact a doctor if:  Your blood sugar is at or above 240 mg/dL (13.3 mmol/L).  Your blood sugar is at or above 200 mg/dL (11.1 mmol/L), and you have ketones in your pee (urine).  You have been sick or have had a fever for 2 days or more and you are not getting  better.  You have any of these problems for more than 6 hours: ? You cannot eat or drink. ? You feel sick to your stomach (nauseous). ? You throw up (vomit). ? You have watery poop (diarrhea). Get help right away if:  Your blood sugar is lower than 54 mg/dL (3 mmol/L).  You get confused.  You have trouble: ? Thinking clearly. ? Breathing.  Your baby moves less than normal.  You have: ? Moderate or large ketone levels in your pee. ? Vaginal bleeding. ? Unusual fluid coming from your vagina. ? Early contractions. These may feel like tightness in your belly. Summary  Type 1 diabetes (type 1 diabetes mellitus) and type 2 diabetes (type 2 diabetes mellitus) are long-term (chronic) diseases.  If diabetes is treated, it may not hurt you or your baby.  Your doctor will set treatment goals for you. This information is not intended to replace advice given to you by your health care provider. Make sure you discuss any questions you have with your health care provider. Document Released: 06/21/2015 Document Revised: 06/18/2018 Document Reviewed: 04/02/2015 Elsevier Patient Education  2020 Elsevier Inc.  

## 2018-11-21 NOTE — Progress Notes (Signed)
°  HPI: Early pregnancy, no pain or bleeding or nausea. BS still elevated.  FBS 140-150.  2hour PPBS 150-170. Takes nightly Insulin dose.  PMHx: She  has a past medical history of Diabetes mellitus without complication ([place]) and Thyroid disease. Also,  has a past surgical history that includes Cesarean section., family history includes Breast cancer (age of onset: 18) in her maternal aunt; Diabetes in her father, mother, and paternal grandmother; Hypertension in her mother; Kidney disease in her maternal grandmother; Leukemia in her maternal grandfather.,  reports that she has never smoked. She has never used smokeless tobacco. She reports that she does not drink alcohol or use drugs.  She has a current medication list which includes the following prescription(s): euthyrox, novofine, onetouch delica plus [id number], metformin, prenatal multivitamin, and tresiba flextouch. Also, has No Known Allergies.  Review of Systems  All other systems reviewed and are negative.   Objective: BP 120/80    Wt 263 lb (119.3 kg)    LMP 10/07/2018 (Exact Date)    BMI 46.59 kg/m   Physical examination Constitutional NAD, Conversant  Skin No rashes, lesions or ulceration.   Extremities: Moves all appropriately.  Normal ROM for age. No lymphadenopathy.  Neuro: Grossly intact  Psych: Oriented to PPT.  Normal mood. Normal affect.   US Ob Comp Less 14 Wks  Result Date: 11/21/2018 Patient Name: Katie [name] DOB: 05-12-1990 MRN: [id number] ULTRASOUND REPORT Location: [facility] Date of Service: 11/21/2018 Indications:dating Findings: [name] intrauterine pregnancy is visualized with a CRL consistent with [redacted]w[redacted]d gestation, giving an (U/S) EDD of 07/16/2019. The (U/S) EDD is consistent with the clinically established EDD of 07/14/2019. The fetal heartbeat is seen but the embryo is to small to pick up the rate today. CRL measurement: 4.0 mm Yolk sac is visualized and appears normal and early anatomy is normal.  Amnion: not visualized Right Ovary is normal in appearance. Left Ovary is normal appearance. Corpus luteal cyst:  Left ovary Survey of the adnexa demonstrates no adnexal masses. There is no free peritoneal fluid in the cul de sac. Impression: 1. [redacted]w[redacted]d Viable Singleton Intrauterine pregnancy by U/S. 2. (U/S) EDD is consistent with Clinically established EDD of 07/14/2019. Recommendations: 1.Clinical correlation with the patient's History and Physical Exam. Gweneth Dimitri, RT Review of ULTRASOUND.    I have personally reviewed images and report of recent ultrasound done at [facility].    Plan of management to be discussed with patient. Barnett Applebaum, MD, [facility], [facility] 11/21/2018  10:32 AM   Assessment:  [redacted] weeks gestation of pregnancy - Plan: POC Urinalysis Dipstick OB  Supervision of high risk pregnancy in first trimester - Plan: AMB referral to maternal fetal medicine  Pre-existing type 2 diabetes mellitus during pregnancy in first trimester - Plan: AMB referral to maternal fetal medicine  BMI 45.0-49.9, adult ([place]) - Plan: AMB referral to maternal fetal medicine  History of cesarean section complicating pregnancy  Hypothyroid in pregnancy, antepartum - Plan: AMB referral to maternal fetal medicine  PLAN: MFM consult, due to IDDM, Obesity    Plan adjustment to Insulin regimen as well VBAC vs CS discussed PNV [place] discussed, no twins, FHT present despite not measurable  Barnett Applebaum, MD, [facility], [facility] 11/21/2018  10:39 AM

## 2018-11-28 ENCOUNTER — Other Ambulatory Visit: Payer: Self-pay

## 2018-11-28 ENCOUNTER — Ambulatory Visit
Admission: RE | Admit: 2018-11-28 | Discharge: 2018-11-28 | Disposition: A | Discharge status: Home / Self Care | Payer: BC Managed Care – PPO | Source: Ambulatory Visit | Attending: Maternal & Fetal Medicine | Admitting: Maternal & Fetal Medicine

## 2018-11-28 DIAGNOSIS — O24111 Pre-existing diabetes mellitus, type 2, in pregnancy, first trimester: Secondary | ICD-10-CM | POA: Diagnosis not present

## 2018-11-28 DIAGNOSIS — E039 Hypothyroidism, unspecified: Secondary | ICD-10-CM | POA: Insufficient documentation

## 2018-11-28 DIAGNOSIS — Z8751 Personal history of pre-term labor: Secondary | ICD-10-CM | POA: Insufficient documentation

## 2018-11-28 DIAGNOSIS — Z8249 Family history of ischemic heart disease and other diseases of the circulatory system: Secondary | ICD-10-CM | POA: Diagnosis not present

## 2018-11-28 DIAGNOSIS — Z833 Family history of diabetes mellitus: Secondary | ICD-10-CM | POA: Insufficient documentation

## 2018-11-28 DIAGNOSIS — Z79890 Hormone replacement therapy (postmenopausal): Secondary | ICD-10-CM | POA: Insufficient documentation

## 2018-11-28 DIAGNOSIS — E119 Type 2 diabetes mellitus without complications: Secondary | ICD-10-CM | POA: Diagnosis not present

## 2018-11-28 DIAGNOSIS — Z3A01 Less than 8 weeks gestation of pregnancy: Secondary | ICD-10-CM | POA: Diagnosis not present

## 2018-11-28 DIAGNOSIS — O99281 Endocrine, nutritional and metabolic diseases complicating pregnancy, first trimester: Secondary | ICD-10-CM | POA: Diagnosis not present

## 2018-11-28 DIAGNOSIS — Z794 Long term (current) use of insulin: Secondary | ICD-10-CM | POA: Diagnosis not present

## 2018-11-28 LAB — GLUCOSE, CAPILLARY: Glucose-Capillary: 84 mg/dL (ref 70–99)

## 2018-11-28 MED ORDER — INSULIN LISPRO 100 UNIT/ML ~~LOC~~ SOLN: SUBCUTANEOUS | 3 refills | Status: DC

## 2018-11-28 NOTE — Progress Notes (Signed)
[place] Consultation    HPI: [name] is a 28 y.o. G3P0101 at [redacted]w[redacted]d by who presents in consultation from  for Type 2 Diabetes, prior preterm birth, and BMI 96.   She reports diagnosis of GDM during pregnancy in 2015 and persistence of GDM postpartum.  She is currerntly taking Tresiba 36u at [facility] and reports fastings of 140-150 and postprandials of 150-260.   She works as a Pharmacist, hospital and reports taking meals at irregular times.  She reports no history of end organ dysfunction secondary to DM.   Her first pregnancy(2013) ended in preterm birth at 73 weeks and the neonate died after 5 days of life. She reports increase in mucous discharge and later presented to hospital with bleeding and later contractions. She was told she had a placental abruption. She delivered at [facility].   Her second pregnancy(2015) was a twin gestation and ended with preterm birth at 51 weeks and SVD A/CS Bof female and female twins weighing 2lb 10 oz and 2 lb 8 oz respectively. Both kids are healthy and doing well.   Past Medical History: Patient  has a past medical history of Diabetes mellitus without complication ([place]) and Thyroid disease.  Past Surgical History: She  has a past surgical history that includes Cesarean section.  Obstetric History:  OB History    Gravida  3   Para  1   Term  0   Preterm  1   AB  0   Living  1     SAB  0   TAB  0   Ectopic  0   Multiple  0   Live Births  1           Medications:  Current Outpatient Medications on File Prior to Encounter  Medication Sig Dispense Refill  . EUTHYROX 50 MCG tablet Take 1 tablet by mouth once daily 90 tablet 1  . Insulin Pen Needle (NOVOFINE) 30G X 8 MM MISC Inject 10 each into the skin daily. 90 each 1  . Lancets (ONETOUCH DELICA PLUS GURKYH06C) MISC     . metFORMIN (GLUCOPHAGE XR) 500 MG 24 hr tablet Take 2 tablets (1,000 mg total) by mouth 2 (two) times daily. 360 tablet 0  . Prenatal  Vit-Fe Fumarate-FA (PRENATAL MULTIVITAMIN) TABS Take 1 tablet by mouth daily.    . TRESIBA FLEXTOUCH 100 UNIT/ML SOPN FlexTouch Pen INJECT 25 UNITS INTO THE SKIN DAILY 24 mL 0   No current facility-administered medications on file prior to encounter.    Allergies: Patient has No Known Allergies.  Social History: Patient  reports that she has never smoked. She has never used smokeless tobacco. She reports that she does not drink alcohol or use drugs. She works as exceptional Biochemist, clinical  Family History: family history includes Breast cancer (age of onset: 24) in her maternal aunt; Diabetes in her father, mother, and paternal grandmother; Hypertension in her mother; Kidney disease in her maternal grandmother; Leukemia in her maternal grandfather.  No family history of birth defects Mgm died of 'blood clot' was on dialysis  Review of Systems A full 12 point review of systems was negative or as noted in the History of Present Illness.  Physical Exam: BP 131/75 (BP Location: Right Arm)   Pulse 77   Temp 98.1 F (36.7 C) (Oral)   Wt 120.7 kg   LMP 10/07/2018 (Exact Date)   SpO2 100%   BMI 47.12 kg/m   Lab Results  Component  Value Date   TSH 2.770 11/08/2018   Lab Results  Component Value Date   HGBA1C 7.2 (A) 08/29/2018    Asessement: Type 2 DM: Concerns related to diabetes in pregnancy are largely due to complications associated with poor glycemic control. Early fetal concerns are related to possible fetal malformations---cardiac malformations are the a major malformation associated with poor preconception glycemic control. The better the preconception glycemic control, the lower the risk for fetal birth defects. Later in gestation, hyperglycemia may lead to fetal macrosomia, increased operative delivery/birth injury, intrauterine fetal demise and and post natal metabolic derangements/need for neonatal intensive care unit management of hypoglycemia. Close monitoring and management of  glycemic control can decrease this risks.  Additionally, pregestational diabetes increases the risk for conditions such as preeclampsia.  --I recommended she continue her current long acting dosage of insulin and initiated humalog 4 u with meals --we referred her to the lifestyles center to review dietary recommendations --Baseline maternal evaluations include evaluation for end organ dysfunction with: TSH, ECG, p:c ratio, opthalmology evaluation.  She had unremarkable labs and reports recent normal ophthalmology evaluation (jan) --Baseline labs obtained early in pregnancy should include: CBC, lfts, creatinine We addressed pregnancy surveillance with 4x daily glucose monitoring (targets during pregnacy: fasting <100, 1h post prandial blood glucose values less than 130 or 2h postprandial less than 120). She was given a blood glucose log today --Midtrimester detailed anatomic survey and monthly growth surveillance after ~26-28  weeks, followed by weekly antenatal testing beginning at 32 weeks and twice weekly testing beginning at 34 weeks. Delivery recommended by 38-39 weeks, unless clinically indicated sooner.  Elevated BMI --we reviewed recommendation for weight gain of 10-15lbs total --we reviewed concerns associated with elevated BMI (as above) and testing surveillance will be performed due to DM (as per above) --low dose aspirin after 12 weeks for preeclampsia risk reduction -- --Sleep apnea screening by clinical symptoms--she snores and experiences somnolence 'all the time'.  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 We addressed possible need for increase in synthroid due to metabolic demands of  pregnancy. --recommend checking tfts q trimeter (or one month after change in dosage)  History of preterm birth, ?cervical insufficiency She had history of prior 23 week loss of a singleton followed by 29 week delivery of twins (svd followed by c/s). She reports being told she may need cerclage following that delivery We discussed the increased risk for preterm birth in the setting of prior preterm birth and I addressed option of 17P weekly for PTB risk reduction.  I also addressed option of waiting until 16 week cervical length (if no cerclage) and giving option of vaginal progesterone at that time.  We reviewed the superiority of prophylactic cerclage over emergent cerclage placement.  We also reviewed potential risks associated with cerclage placement.  --she is considering 17OHP weekly for preterm birth risk reduction --[facility] requested from  [facility].  She is leaning towards cerclage placement and I agree the history is highly suggestive of cervical insufficiency.   --she will return in one week, if cerclage planned, we would recommend placement after 12 weeks (~12-14w) --serial ultrasounds every 2 weeks beginning at 16 weeks (if no cerclage placed)--if cervical shortening noted--vaginal progesterone can be given and she  can be referred for rescue cerclage   Total time spent with the patient was 30 minutes with greater than 50% spent in counseling and coordination of care. We appreciate this interesting consult and will be happy to be involved in the ongoing care of Katie [name] in anyway her obstetricians desire.  Consuelo PandyMaria Janara Klett, MD Maternal-Fetal Medicine [facility]

## 2018-11-28 NOTE — Progress Notes (Signed)
Pt seen in [facility] today.  Dr. Diamantina Monks ordered Referral to [facility] for Diabetes Education, Records from [facility], new Humalog Rx sent to Pharmacy by MD, 1 week f/u appt scheduled back at [facility] to review records and BS log to review.

## 2018-11-29 ENCOUNTER — Encounter: Payer: Self-pay | Admitting: Obstetrics and Gynecology

## 2018-11-29 ENCOUNTER — Encounter: Payer: Self-pay | Admitting: Physician Assistant

## 2018-11-29 NOTE — Telephone Encounter (Signed)
It's OK to cancel.

## 2018-11-29 NOTE — Telephone Encounter (Signed)
Please advise 

## 2018-12-02 ENCOUNTER — Ambulatory Visit: Payer: Self-pay | Admitting: Physician Assistant

## 2018-12-03 ENCOUNTER — Encounter: Payer: BC Managed Care – PPO | Attending: Maternal & Fetal Medicine | Admitting: Dietician

## 2018-12-03 ENCOUNTER — Other Ambulatory Visit: Payer: Self-pay

## 2018-12-03 ENCOUNTER — Encounter: Payer: Self-pay | Admitting: Dietician

## 2018-12-03 VITALS — BP 128/84 | Ht 62.0 in

## 2018-12-03 DIAGNOSIS — O26899 Other specified pregnancy related conditions, unspecified trimester: Secondary | ICD-10-CM | POA: Insufficient documentation

## 2018-12-03 DIAGNOSIS — Z794 Long term (current) use of insulin: Secondary | ICD-10-CM | POA: Insufficient documentation

## 2018-12-03 DIAGNOSIS — E118 Type 2 diabetes mellitus with unspecified complications: Secondary | ICD-10-CM | POA: Insufficient documentation

## 2018-12-03 DIAGNOSIS — O24111 Pre-existing diabetes mellitus, type 2, in pregnancy, first trimester: Secondary | ICD-10-CM | POA: Diagnosis not present

## 2018-12-03 DIAGNOSIS — Z8751 Personal history of pre-term labor: Secondary | ICD-10-CM | POA: Diagnosis not present

## 2018-12-03 DIAGNOSIS — E039 Hypothyroidism, unspecified: Secondary | ICD-10-CM | POA: Diagnosis not present

## 2018-12-03 DIAGNOSIS — R102 Pelvic and perineal pain: Secondary | ICD-10-CM | POA: Diagnosis not present

## 2018-12-03 DIAGNOSIS — Z3A09 9 weeks gestation of pregnancy: Secondary | ICD-10-CM | POA: Insufficient documentation

## 2018-12-03 DIAGNOSIS — O99211 Obesity complicating pregnancy, first trimester: Secondary | ICD-10-CM | POA: Insufficient documentation

## 2018-12-03 DIAGNOSIS — O99280 Endocrine, nutritional and metabolic diseases complicating pregnancy, unspecified trimester: Secondary | ICD-10-CM | POA: Insufficient documentation

## 2018-12-04 NOTE — Progress Notes (Signed)
Diabetes Self-Management Education  Visit Type: First/Initial  Appt. Start Time: 1615 Appt. End Time: 1730  12/04/2018  Katie [name], identified by name and date of birth, is a 28 y.o. female with a diagnosis of Diabetes: Type 2.   ASSESSMENT  Blood pressure 128/84, height 5\' 2"  (1.575 m), last menstrual period 10/07/2018, unknown if currently breastfeeding. Body mass index is 48.65 kg/m.  Diabetes Self-Management Education - 12/04/18 1422      Visit Information   Visit Type  First/Initial      Initial Visit   Diabetes Type  Type 2      Health Coping   How would you rate your overall health?  Good      Psychosocial Assessment   Self-care barriers  None    Self-management support  Family;Doctor's office    Other persons present  Patient   pt brought her two 27 year old twins to appt   Patient Concerns  Weight Control;Glycemic Control;Medication;Healthy Lifestyle    Special Needs  None    Preferred Learning Style  Hands on;Visual;Auditory;No preference indicated    Learning Readiness  Ready    What is the last grade level you completed in school?  Bachelor degree      Pre-Education Assessment   Patient understands the diabetes disease and treatment process.  Needs Review    Patient understands incorporating nutritional management into lifestyle.  Needs Review    Patient undertands incorporating physical activity into lifestyle.  Needs Review    Patient understands using medications safely.  Needs Review    Patient understands monitoring blood glucose, interpreting and using results  Needs Review    Patient understands prevention, detection, and treatment of acute complications.  Needs Review    Patient understands prevention, detection, and treatment of chronic complications.  Needs Review    Patient understands how to develop strategies to address psychosocial issues.  Needs Review    Patient understands how to develop strategies to promote health/change behavior.   Needs Review      Complications   Last HgB A1C per patient/outside source  7.3 %   08-2018   How often do you check your blood sugar?  3-4 times/day    Fasting Blood glucose range (mg/dL)  01[birth year]23[birth year]1985    Postprandial Blood glucose range (mg/dL)  55-974    Have you had a dilated eye exam in the past 12 months?  Yes   03-2018   Have you had a dental exam in the past 12 months?  No   about 3 years ago   Are you checking your feet?  No      Dietary Intake   Breakfast  eats breakfast at 8a-9a; eats fried foods 4-5x/wk and sweets/desserts  4-5x/wk    Snack (morning)  eats nabs or fruit at 10a    Lunch  eats lunch 12p-1p but sometimes skips meal and eats only fruit or nabs-reports feeling full and decreased appetite    Snack (afternoon)  eats nabs or fruit at 2p    Dinner  supper time varies-sometimes eats only fruit or crackers    Snack (evening)  none    Beverage(s)  drinks water 8+x/day and diet drinks 2-3x/day      Exercise   Exercise Type  ADL's      Patient Education   Previous Diabetes Education  Yes (please comment)   in [place]   Disease state   Definition of diabetes, type 1 and 2, and the diagnosis of  diabetes;Factors that contribute to the development of diabetes;Explored patient's options for treatment of their diabetes    Nutrition management   Role of diet in the treatment of diabetes and the relationship between the three main macronutrients and blood glucose level;Food label reading, portion sizes and measuring food.;Carbohydrate counting    Physical activity and exercise   Role of exercise on diabetes management, blood pressure control and cardiac health.;Helped patient identify appropriate exercises in relation to his/her diabetes, diabetes complications and other health issue.    Medications  Taught/reviewed insulin injection, site rotation, insulin storage and needle disposal.;Reviewed patients medication for diabetes, action, purpose, timing of dose and side effects.     Monitoring  Purpose and frequency of SMBG.;Taught/discussed recording of test results and interpretation of SMBG.;Identified appropriate SMBG and/or A1C goals.   asked pt to check BG's 4x/day=FBG daily and ac lunch and supper or 2 hr pp those meals + bedtime daily   Acute complications  Taught treatment of hypoglycemia - the 15 rule.    Chronic complications  Relationship between chronic complications and blood glucose control    Preconception care  Reviewed with patient blood glucose goals with pregnancy    Personal strategies to promote health  Lifestyle issues that need to be addressed for better diabetes care;Helped patient develop diabetes management plan for (enter comment)      Outcomes   Expected Outcomes  Demonstrated interest in learning. Expect positive outcomes       Individualized Plan for Diabetes Self-Management Training:   Learning Objective:  Patient will have a greater understanding of diabetes self-management. Patient education plan is to attend individual and/or group sessions per assessed needs and concerns.   Plan:   Read Pregnancy and Diabetes booklet  Follow meal guidelines  Check BG's 4x/day-fasting, ac lunch and supper or 2 hr pp lunch and supper and bedtime  Walk 20 min at least 5x/wk if permitted by MD  Eat 3 meals and 3 snacks/day  Eat 2 carbohydrate servings with breakfast and 3 carbohydrate servings with lunch and supper + protein  Eat 1 carbohydrate serving/snack + protein-if unable to eat 3 carb servings with lunch and supper increase afternoon and bedtime snacks to 2 carb servings  Continue to drink plenty of water  Limit intake of diet drinks to only 1/day   Avoid fried foods and sweets/desserts  Take Humalog 15 min. before meals when able  Check urine ketones every AM, if +, increase bedtime snack to 2 carbohydrate servings  Carry glucose tablets or candy and medical alert ID at all times   Complete a 3 day food record and bring to  next appointment with dietitian on 12-11-18   Expected Outcomes:  Demonstrated interest in learning. Expect positive outcomes  Education material provided: Diabetes and Pregnancy booklet, Low BG handout, 1 tube glucose tablets for PRN use, medical alert ID card, General meal planning guidelines for pregnancy  If problems or questions, patient to contact team via:  [phone]  Future DSME appointment:  12-11-18

## 2018-12-05 ENCOUNTER — Other Ambulatory Visit: Payer: Self-pay

## 2018-12-05 ENCOUNTER — Encounter: Payer: BC Managed Care – PPO | Admitting: Maternal Newborn

## 2018-12-05 ENCOUNTER — Telehealth: Payer: Self-pay | Admitting: *Deleted

## 2018-12-05 ENCOUNTER — Ambulatory Visit
Admission: RE | Admit: 2018-12-05 | Discharge: 2018-12-05 | Disposition: A | Discharge status: Home / Self Care | Payer: BC Managed Care – PPO | Source: Ambulatory Visit | Attending: Obstetrics and Gynecology | Admitting: Obstetrics and Gynecology

## 2018-12-05 DIAGNOSIS — O99211 Obesity complicating pregnancy, first trimester: Secondary | ICD-10-CM | POA: Diagnosis not present

## 2018-12-05 DIAGNOSIS — Z8751 Personal history of pre-term labor: Secondary | ICD-10-CM | POA: Diagnosis not present

## 2018-12-05 DIAGNOSIS — O34219 Maternal care for unspecified type scar from previous cesarean delivery: Secondary | ICD-10-CM | POA: Diagnosis not present

## 2018-12-05 DIAGNOSIS — Z3A08 8 weeks gestation of pregnancy: Secondary | ICD-10-CM | POA: Diagnosis not present

## 2018-12-05 DIAGNOSIS — O24111 Pre-existing diabetes mellitus, type 2, in pregnancy, first trimester: Secondary | ICD-10-CM | POA: Diagnosis not present

## 2018-12-05 DIAGNOSIS — Z8349 Family history of other endocrine, nutritional and metabolic diseases: Secondary | ICD-10-CM | POA: Insufficient documentation

## 2018-12-05 DIAGNOSIS — Z803 Family history of malignant neoplasm of breast: Secondary | ICD-10-CM | POA: Diagnosis not present

## 2018-12-05 DIAGNOSIS — E039 Hypothyroidism, unspecified: Secondary | ICD-10-CM | POA: Insufficient documentation

## 2018-12-05 DIAGNOSIS — O99280 Endocrine, nutritional and metabolic diseases complicating pregnancy, unspecified trimester: Secondary | ICD-10-CM

## 2018-12-05 DIAGNOSIS — E119 Type 2 diabetes mellitus without complications: Secondary | ICD-10-CM | POA: Diagnosis not present

## 2018-12-05 DIAGNOSIS — O99281 Endocrine, nutritional and metabolic diseases complicating pregnancy, first trimester: Secondary | ICD-10-CM | POA: Diagnosis not present

## 2018-12-05 DIAGNOSIS — Z833 Family history of diabetes mellitus: Secondary | ICD-10-CM | POA: Insufficient documentation

## 2018-12-05 HISTORY — DX: Personal history of pre-term labor: Z87.51

## 2018-12-05 LAB — GLUCOSE, CAPILLARY: Glucose-Capillary: 158 mg/dL — ABNORMAL HIGH (ref 70–99)

## 2018-12-05 NOTE — Progress Notes (Signed)
[place] Maternal-Fetal Medicine Consultation Follow Up   Chief Complaint: diabetes and history of preterm delivery x 2  HPI: [name] is a 28 y.o. [id number] at 24w3dby LMP c/w [id number] [facility] during the current [facility] regarding her history of diabetes (type 2) and preterm delivery x 2.  This is a follow up visit after full MFM consult last week after records were requested for review.  Ms. JeNawazirst delivery was in 2013 at  at which time she was [age]ears old.  Per Epic records, she presented to the ED at 23 weeks 2 days after about 14 hours of painful cramping and several hours of bleeding with passage of clots.  She was visibly 5 cm with a bulging bag and quickly progressed to deliver a live infant who subsequently died.  A 3 x 4 cm marginal retroplacental clot was noted at the time of delivery.  Placental pathology was notable for "acute chorioamnionitis and focal funisitis.  Negative for villitis.  2 vessel umbilical cord".  [facility] she was pregnant with di/di twins and received a prophylactic pessary for cervical support.  She was admitted at about 23 weeks for cervical shortening, d/ced after a week due to no change.  She was subsequently admitted about 26 weeks due to cervical dilation and delivered at about 29 weeks after laboring (Twin A SVD and Twin B c/s due to breech per her report).    As per her diabetes, she has only just started the Humalog that was prescribed by Katie. SmDiamantina Monksast week.  She continues to increase her long acting nightly insulin and is currently up to 38U nightly.  She met with [facility] 2 days ago.  Obstetric History:  OB History  Gravida Para Term Preterm AB Living  3 2 0 2 0 2  SAB TAB Ectopic Multiple Live Births  0 0 0 1 2    # Outcome Date GA Lbr Len/2nd Weight Sex Delivery Anes PTL Lv  3 Current           2A Preterm  06/29/13     Vag-Spont     2B Preterm 06/29/13     CS-Unspec     1 Preterm 01/27/12 2395w2d:07 / 00:03 490 g F Vag-Spont EPI  LIV     Name: Katie [name],Katie [name]     Apgar1: 4  Apgar5: 6    Obstetric Comments  [facility]    Past Medical History: Patient  has a past medical history of Diabetes [place] complication [place] of [place], and Thyroid disease.   Past Surgical History: She  has a past surgical history that includes Cesarean section.   Medications: Euthyrox 62m51maily              Metformin 500mg47mtabs (1000mg 13ml) BID              PNVs   Humalog 4U with meals   Tresiba 28U nightly  Allergies: Patient has No Known Allergies.   Social History: Patient  reports that she has never smoked. She has never used smokeless tobacco. She reports that she does not drink alcohol or use drugs.   Family History: family history includes Breast cancer (age of onset: 43) in11er maternal aunt; Diabetes in her father, mother, and paternal grandmother; Hypertension in her mother; Kidney disease  in her maternal grandmother; Leukemia in her maternal grandfather.   Review of Systems A full 12 point review of systems was negative or as noted in the History of Present Illness.  Physical Exam: BP 139/71 (BP Location: Right Arm)   Pulse 91   Temp (!) 97.2 F (36.2 C) (Temporal)   Resp 18   Ht '5\' 2"'  (1.575 m)   Wt 118.4 kg   LMP 10/07/2018 (Exact Date)   SpO2 99%   BMI 47.74 kg/m   Asessement: 1. Morbid obesity ([place])   2. Pre-existing type 2 diabetes mellitus during pregnancy in first trimester   3. History of cesarean section complicating pregnancy   4. Hypothyroid in pregnancy, antepartum   5. History of preterm delivery     Recommendations: 1.  Obesity  S/p Lifestyles consult  Limit weight gain to <15#  Ultrasound/antenatal testing/delivery recommendations per diabetes  3rd trimester anesthesia  consult  Consider baseline EKG  Sleep apnea referral (see prior consult)  32m ASA after 12 weeks  2.  Type 2 diabetes  S/p Lifestyles consult  Continue to titrate long acting insulin to achieve FBS <100 (preferably <90, if tolerated)  May increase humalog with meals but only had a few days of her log so didn't alter regimen at this time  Baseline preX labs wnls  830mASA after 12 weeks  Needs anatomy USKoreat 18 weeks, consider first trimester anatomy USKoreat 12 weeks, fetal echo at 22 weeks  Serial US[place] growth starting at 28 weeks  Antenatal testing starting at 32 weeks  Delivery 37-39 weeks pending diabetic control  3.  History of c/s  Discuss mode of delivery closer to 3rd trimester  Recommend postpartum VTE prophylaxis   4.  Hypothyroid  TSH normal on 11/08/18  Continue synthroid   Agree with plan for TFTs each trimester  5.  History of preterm delivery  First pregnancy consistent with abruption rather than cervical insufficiency  2nd pregnancy complicated by twins  Would recommend serial cervical length screening starting at 16 weeks  If < 2.5 cm, offer cerclage  (plan reviewed with Katie. WhMee Hivesho manages [facility]  Will schedule follow up consult to review BS in 4 weeks.  We would be more than happy to schedule either first trimester ultrasound or cervical length screening ultrasounds or [facility].   Total time spent with the patient was 30 minutes with greater than 50% spent in counseling and coordination of care.  We appreciate this interesting consult and will be happy to be involved in the ongoing care of [name] anyway her obstetricians desire.  Katie NeatMD [facility]

## 2018-12-05 NOTE — Telephone Encounter (Signed)
Received voice mail from [place] at [facility]. She was concerned that patient's meter and their meter was different by 20 points. Called back and explained that meters will register different depending on type. The patient is using [employer] brand. Instructed her to call me back if she wants patient to have a brand meter and patient can pick up at the front desk. She has Luna and One Touch is the preferred meter. Explained that patient would need prescription for strips and lancets for insurance coverage. Also some patients find strips cheaper when using store meter (like [employer]) than going through insurance.

## 2018-12-05 NOTE — Addendum Note (Signed)
Encounter addended by: Wynona Neat, MD on: 12/05/2018 11:38 AM  Actions taken: Problem List modified

## 2018-12-06 ENCOUNTER — Telehealth: Payer: Self-pay

## 2018-12-06 ENCOUNTER — Ambulatory Visit: Payer: BC Managed Care – PPO

## 2018-12-06 DIAGNOSIS — O26899 Other specified pregnancy related conditions, unspecified trimester: Secondary | ICD-10-CM

## 2018-12-06 LAB — POCT URINALYSIS DIPSTICK
Bilirubin, UA: NEGATIVE
Blood, UA: NEGATIVE
Glucose, UA: NEGATIVE
Ketones, UA: NEGATIVE
Leukocytes, UA: NEGATIVE
Nitrite, UA: NEGATIVE
Protein, UA: NEGATIVE
Spec Grav, UA: 1.015 (ref 1.010–1.025)
Urobilinogen, UA: 0.2 E.U./dL
pH, UA: 6.5 (ref 5.0–8.0)

## 2018-12-06 NOTE — Telephone Encounter (Signed)
Pt coming to drop off a urine to check to see if there is a possible UTI/infection. Pt is not experiencing any bleeding or severe pelvic pain.

## 2018-12-06 NOTE — Telephone Encounter (Signed)
Patient is returning call. Please advise? 

## 2018-12-06 NOTE — Telephone Encounter (Signed)
Pt called triage stating she is having some vaginal pressure, cramping and some back pain. I have left a VM for pt to call back and speak with me as this could be a UTI or something else but I would like to get more information from her.

## 2018-12-06 NOTE — Telephone Encounter (Signed)
Pt called with pelvic pressure, cramping and increasing discomfort. Pt stated she is having no bleeding. I had a pt leave a urine to do a UA on it and the UA was WNL. Please advise

## 2018-12-09 ENCOUNTER — Other Ambulatory Visit: Payer: Self-pay

## 2018-12-09 ENCOUNTER — Ambulatory Visit (INDEPENDENT_AMBULATORY_CARE_PROVIDER_SITE_OTHER): Payer: BC Managed Care – PPO | Admitting: Obstetrics and Gynecology

## 2018-12-09 VITALS — BP 138/88 | Wt 261.0 lb

## 2018-12-09 DIAGNOSIS — Z6841 Body Mass Index (BMI) 40.0 and over, adult: Secondary | ICD-10-CM

## 2018-12-09 DIAGNOSIS — O99281 Endocrine, nutritional and metabolic diseases complicating pregnancy, first trimester: Secondary | ICD-10-CM

## 2018-12-09 DIAGNOSIS — O99280 Endocrine, nutritional and metabolic diseases complicating pregnancy, unspecified trimester: Secondary | ICD-10-CM

## 2018-12-09 DIAGNOSIS — O09211 Supervision of pregnancy with history of pre-term labor, first trimester: Secondary | ICD-10-CM

## 2018-12-09 DIAGNOSIS — O34219 Maternal care for unspecified type scar from previous cesarean delivery: Secondary | ICD-10-CM

## 2018-12-09 DIAGNOSIS — Z8751 Personal history of pre-term labor: Secondary | ICD-10-CM

## 2018-12-09 DIAGNOSIS — O99211 Obesity complicating pregnancy, first trimester: Secondary | ICD-10-CM

## 2018-12-09 DIAGNOSIS — O0991 Supervision of high risk pregnancy, unspecified, first trimester: Secondary | ICD-10-CM

## 2018-12-09 DIAGNOSIS — Z3A09 9 weeks gestation of pregnancy: Secondary | ICD-10-CM

## 2018-12-09 DIAGNOSIS — O24111 Pre-existing diabetes mellitus, type 2, in pregnancy, first trimester: Secondary | ICD-10-CM

## 2018-12-09 DIAGNOSIS — E039 Hypothyroidism, unspecified: Secondary | ICD-10-CM

## 2018-12-09 NOTE — Progress Notes (Signed)
Routine Prenatal Care Visit  Subjective  Katie [name] is a 28 y.o. [phone] at [redacted]w[redacted]d being seen today for ongoing prenatal care.  She is currently monitored for the following issues for this high-risk pregnancy and has Type 2 diabetes mellitus with hyperosmolarity without coma, with long-term current use of insulin (HCC); Morbid obesity (HCC); Supervision of high risk pregnancy in first trimester; Pre-existing type 2 diabetes mellitus during pregnancy in first trimester; Obesity affecting pregnancy in first trimester; BMI 45.0-49.9, adult (HCC); History of cesarean section complicating pregnancy; Hypothyroid in pregnancy, antepartum; and History of preterm delivery on their problem list.  ----------------------------------------------------------------------------------- Patient reports had some pelvic pressure last week.  Resolved now..    Katie [name]. Bleeding: None.   . Leaking Fluid denies.  T2DM: Presents with very incomplete log. Denies symptomatic lows with her blood glucose. The values she presents are improved, but most values are missing. The ones presented are elevated and not in range. ----------------------------------------------------------------------------------- The following portions of the patient's history were reviewed and updated as appropriate: allergies, current medications, past family history, past medical history, past social history, past surgical history and problem list. Problem list updated.  Objective  Blood pressure 138/88, weight 261 lb (118.4 kg), last menstrual period 10/07/2018, unknown if currently breastfeeding. Pregravid weight 265 lb (120.2 kg) Total Weight Gain -4 lb (-1.814 kg) Urinalysis: Urine Protein    Urine Glucose    Fetal Status: Fetal Heart Rate (bpm): 180         General:  Alert, oriented and cooperative. Patient is in no acute distress.  Skin: Skin is warm and dry. No rash noted.   Cardiovascular: Normal heart rate noted  Respiratory: Normal  respiratory effort, no problems with respiration noted  Abdomen: Soft, gravid, appropriate for gestational age. Pain/Pressure: Present     Pelvic:  Cervical exam deferred        Extremities: Normal range of motion.     Mental Status: Normal mood and affect. Normal behavior. Normal judgment and thought content.   BSUS: single, living IUP with +cardiac activity.  No obvious abnormalities.   Assessment   28 y.o. [id number] at [redacted]w[redacted]d by  07/14/2019, by Last Menstrual Period presenting for routine prenatal visit  Plan   THIRD Problems (from 11/15/18 to present)    Problem Noted Resolved   History of preterm delivery 12/05/2018 by Kirby Funk, MD No   Overview Signed 12/05/2018 11:22 AM by Kirby Funk, MD    Recommend serial cervical length screening starting at 16 weeks Refer for possible cerclage if cervical length < 2.5 cm      Supervision of high risk pregnancy in first trimester 11/15/2018 by Conard Novak, MD No   Overview Signed 12/10/2018  6:54 PM by Conard Novak, MD    Clinic [facility] Prenatal Labs  Dating  Blood type: B/Positive/-- (09/04 1216)   Genetic Screen 1 Screen:    AFP:     Quad:     NIPS: Antibody:Negative (09/04 1216)  Anatomic [place]  Rubella: 5.05 (09/04 1216) Varicella: @VZVIGG @  GTT Early:               Third trimester:  RPR: Non Reactive (09/04 1216)   Rhogam  HBsAg: Negative (09/04 1216)   TDaP vaccine                       Flu Shot: HIV: Non Reactive (09/04 1216)   Baby Food  GBS:   Contraception  Pap:  CBB     CS/VBAC    Support Person            Pre-existing type 2 diabetes mellitus during pregnancy in first trimester 11/15/2018 by Will Bonnet, MD No   Overview Signed 12/10/2018  6:56 PM by Will Bonnet, MD    Insulin managed by [facility]. See notes for latest dosing      Obesity affecting pregnancy in first trimester 11/15/2018 by Will Bonnet, MD No   Overview Signed 12/10/2018  6:55 PM by Will Bonnet, MD    BMI >=40 [ ]  early 1h gtt -  [ ]  u/s for dating [ ]   [ ]  nutritional goals [ ]  folic acid 1mg  [ ]  bASA (>12 weeks) [ ]  consider nutrition consult [ ]  consider maternal EKG 1st trimester [ ]  Growth u/s 28 [ ] , 32 [ ] , 36 weeks [ ]  [ ]  NST/AFI weekly 36+ weeks (36[] , 37[] , 38[] , 39[] , 40[] ) [ ]  IOL by 41 weeks (scheduled, prn [] )       History of cesarean section complicating pregnancy 11/17/7891 by Will Bonnet, MD No   Hypothyroid in pregnancy, antepartum 11/15/2018 by Will Bonnet, MD No      Preterm labor symptoms and general obstetric precautions including but not limited to vaginal bleeding, contractions, leaking of fluid and fetal movement were reviewed in detail with the patient. Please refer to After Visit Summary for other counseling recommendations.   -Plan is to transfer patient to [facility] for ongoing prenatal care and delivery, given her very high risk pregnancy.  There are several concerning factors with her pregnancy.  Put together, the physicians agrees with this is the degree of risk of pregnancy for this clinic.  This was discussed in great detail with the patient.  She was appreciative of the discussion and agrees to transfer.  Will continue to participate in her care until she is completely transferred.  Return in about 1 week (around 12/16/2018) for Labs and Routine Prenatal Appointment.  Prentice Docker, MD, [facility], [facility] 12/10/2018 6:52 PM

## 2018-12-10 ENCOUNTER — Encounter: Payer: Self-pay | Admitting: Obstetrics and Gynecology

## 2018-12-11 ENCOUNTER — Encounter: Payer: Self-pay | Admitting: Dietician

## 2018-12-11 ENCOUNTER — Encounter: Payer: BC Managed Care – PPO | Admitting: Dietician

## 2018-12-11 ENCOUNTER — Other Ambulatory Visit: Payer: Self-pay

## 2018-12-11 VITALS — BP 122/86 | Ht 62.0 in | Wt 259.5 lb

## 2018-12-11 DIAGNOSIS — O24111 Pre-existing diabetes mellitus, type 2, in pregnancy, first trimester: Secondary | ICD-10-CM | POA: Diagnosis not present

## 2018-12-11 NOTE — Patient Instructions (Signed)
   Continue to control the amount of carbs with meals, ideally 30-45grams. Choosing foods with fiber usually affect blood sugar less.   Avoid using saccharin (Sweet-n-low, pink packets) during pregnancy, this does cross over the placenta into the baby's system.   Try moving or light exercise for about 10-15 minutes after eating to lessen the effect on blood sugar.

## 2018-12-11 NOTE — Progress Notes (Signed)
.   Patient's BG record indicates fasting BGs ranging 87-104 while taking 38 units Tresiba, and post-meal BGs ranging 92-144 + readings of 100, 164, 177 without mealtime insulin.  . Patient's dietary recall indicates some varying amounts of carbohydrate with meals; patient has been trying to limit to less than 45grams with meals since noticing that higher amounts lead to elevated BGs.  . Patient reports some nausea when going too long without eating, but still some decreased appetite. She has been eating regularly, snacks and small meals.   . Provided 1700kcal meal plan, and wrote individualized menus based on patient's food preferences. . Discussed meal options, restaurant meal options, controlling carb intake by including more low-carb vegetables and proteins.  . Patient asked whether keto diet would be advisable during pregnancy; advised against keto diet.  . Instructed patient on food safety, including avoidance of Listeriosis, and limiting mercury from fish. . Encouraged patient to call with any questions or concerns, or to schedule RD or RN follow-up as needed.

## 2018-12-12 ENCOUNTER — Encounter: Payer: BC Managed Care – PPO | Admitting: Obstetrics and Gynecology

## 2018-12-12 ENCOUNTER — Other Ambulatory Visit: Payer: Self-pay | Admitting: Obstetrics and Gynecology

## 2018-12-12 DIAGNOSIS — E039 Hypothyroidism, unspecified: Secondary | ICD-10-CM

## 2018-12-12 DIAGNOSIS — Z8751 Personal history of pre-term labor: Secondary | ICD-10-CM

## 2018-12-12 DIAGNOSIS — O99211 Obesity complicating pregnancy, first trimester: Secondary | ICD-10-CM

## 2018-12-12 DIAGNOSIS — O34219 Maternal care for unspecified type scar from previous cesarean delivery: Secondary | ICD-10-CM

## 2018-12-12 DIAGNOSIS — O99280 Endocrine, nutritional and metabolic diseases complicating pregnancy, unspecified trimester: Secondary | ICD-10-CM

## 2018-12-12 DIAGNOSIS — Z6841 Body Mass Index (BMI) 40.0 and over, adult: Secondary | ICD-10-CM

## 2018-12-12 DIAGNOSIS — O0991 Supervision of high risk pregnancy, unspecified, first trimester: Secondary | ICD-10-CM

## 2018-12-12 DIAGNOSIS — E1100 Type 2 diabetes mellitus with hyperosmolarity without nonketotic hyperglycemic-hyperosmolar coma (NKHHC): Secondary | ICD-10-CM

## 2018-12-18 ENCOUNTER — Other Ambulatory Visit: Payer: Self-pay

## 2018-12-18 ENCOUNTER — Ambulatory Visit (INDEPENDENT_AMBULATORY_CARE_PROVIDER_SITE_OTHER): Payer: BC Managed Care – PPO | Admitting: Obstetrics and Gynecology

## 2018-12-18 ENCOUNTER — Encounter: Payer: Self-pay | Admitting: Obstetrics and Gynecology

## 2018-12-18 VITALS — BP 125/71 | HR 91 | Wt 262.0 lb

## 2018-12-18 DIAGNOSIS — O99211 Obesity complicating pregnancy, first trimester: Secondary | ICD-10-CM

## 2018-12-18 DIAGNOSIS — E039 Hypothyroidism, unspecified: Secondary | ICD-10-CM

## 2018-12-18 DIAGNOSIS — O0991 Supervision of high risk pregnancy, unspecified, first trimester: Secondary | ICD-10-CM

## 2018-12-18 DIAGNOSIS — Z6841 Body Mass Index (BMI) 40.0 and over, adult: Secondary | ICD-10-CM

## 2018-12-18 DIAGNOSIS — O09211 Supervision of pregnancy with history of pre-term labor, first trimester: Secondary | ICD-10-CM

## 2018-12-18 DIAGNOSIS — Z3A10 10 weeks gestation of pregnancy: Secondary | ICD-10-CM

## 2018-12-18 DIAGNOSIS — O24111 Pre-existing diabetes mellitus, type 2, in pregnancy, first trimester: Secondary | ICD-10-CM

## 2018-12-18 DIAGNOSIS — Z8751 Personal history of pre-term labor: Secondary | ICD-10-CM

## 2018-12-18 DIAGNOSIS — O34219 Maternal care for unspecified type scar from previous cesarean delivery: Secondary | ICD-10-CM

## 2018-12-18 DIAGNOSIS — Z1379 Encounter for other screening for genetic and chromosomal anomalies: Secondary | ICD-10-CM

## 2018-12-18 DIAGNOSIS — O99281 Endocrine, nutritional and metabolic diseases complicating pregnancy, first trimester: Secondary | ICD-10-CM

## 2018-12-18 LAB — POCT URINALYSIS DIPSTICK OB
Glucose, UA: NEGATIVE
POC,PROTEIN,UA: NEGATIVE

## 2018-12-18 NOTE — Progress Notes (Signed)
Routine Prenatal Care Visit  Subjective  Katie [name] is a 28 y.o. [phone] at [redacted]w[redacted]d being seen today for ongoing prenatal care.  She is currently monitored for the following issues for this high-risk pregnancy and has Type 2 diabetes mellitus with hyperosmolarity without coma, with long-term current use of insulin ([place]); Morbid obesity ([place]); Supervision of high risk pregnancy in first trimester; Pre-existing type 2 diabetes mellitus during pregnancy in first trimester; Obesity affecting pregnancy in first trimester; BMI 45.0-49.9, adult ([place]); History of cesarean section complicating pregnancy; Hypothyroid in pregnancy, antepartum; and History of preterm delivery on their problem list.  ----------------------------------------------------------------------------------- Patient reports no complaints.    . Vag. Bleeding: None.   . Leaking Fluid denies.  T2DM: increase Triseba to 43 units QHS    Blood glucose log from today: most fasting values elevated, most 2h pp bfast OK, about half 2h pp lunch elevated, not many 2h pp dinner values 2/3 normal (yellow = 1h pp. So, borderline value) No low blood glucose values. No symptoms of hypoglycemia ----------------------------------------------------------------------------------- The following portions of the patient's history were reviewed and updated as appropriate: allergies, current medications, past family history, past medical history, past social history, past surgical history and problem list. Problem list updated.  Objective  Blood pressure 125/71, pulse 91, weight 262 lb (118.8 kg), last menstrual period 10/07/2018, unknown if currently breastfeeding. Pregravid weight 265 lb (120.2 kg) Total Weight Gain -3 lb (-1.361 kg) Urinalysis: Urine Protein Negative  Urine Glucose Negative  Fetal Status: Fetal Heart Rate (bpm): 170         General:  Alert, oriented and cooperative. Patient is in no acute distress.  Skin: Skin is warm and dry. No  rash noted.   Cardiovascular: Normal heart rate noted  Respiratory: Normal respiratory effort, no problems with respiration noted  Abdomen: Soft, gravid, appropriate for gestational age.       Pelvic:  Cervical exam deferred        Extremities: Normal range of motion.     Mental Status: Normal mood and affect. Normal behavior. Normal judgment and thought content.    BSUS: FHR 170s, movement noted, CRL 3.3 cm ~[redacted]w[redacted]d GA. Assessment   28 y.o. [id number] at [redacted]w[redacted]d by  07/14/2019, by Last Menstrual Period presenting for routine prenatal visit  Plan   THIRD Problems (from 11/15/18 to present)    Problem Noted Resolved   History of preterm delivery 12/05/2018 by Wynona Neat, MD No   Overview Signed 12/05/2018 11:22 AM by Wynona Neat, MD    Recommend serial cervical length screening starting at 16 weeks Refer for possible cerclage if cervical length < 2.5 cm      Supervision of high risk pregnancy in first trimester 11/15/2018 by Will Bonnet, MD No   Overview Signed 12/10/2018  6:54 PM by Will Bonnet, MD    Clinic [facility] Prenatal Labs  Dating LMP Blood type: B/Positive/-- (09/04 1216)   Genetic Screen AFP:     NIPS: ord'd today Antibody:Negative (09/04 1216)  Anatomic [place]  Rubella: 5.05 (09/04 1216)  Varicella: Immune  GTT N/a  RPR: Non Reactive (09/04 1216)   Rhogam n/a HBsAg: Negative (09/04 1216)   TDaP vaccine                       Flu Shot: HIV: Non Reactive (09/04 1216)   [place]  GBS:   Contraception  Pap:  CBB     CS/VBAC    Support Person            Pre-existing type 2 diabetes mellitus during pregnancy in first trimester 11/15/2018 by Conard Novak, MD No   Overview Signed 12/10/2018  6:56 PM by Conard Novak, MD    Insulin managed by [facility]. See notes for latest dosing      Obesity affecting pregnancy in first trimester 11/15/2018 by Conard Novak, MD No   Overview Signed 12/10/2018  6:55 PM by Conard Novak, MD    BMI >=40 [ ]  early 1h gtt -  [ ]  u/s for dating [ ]   [ ]  nutritional goals [ ]  folic acid 1mg  [ ]  bASA (>12 weeks) [ ]  consider nutrition consult [ ]  consider maternal EKG 1st trimester [ ]  Growth u/s 28 [ ] , 32 [ ] , 36 weeks [ ]  [ ]  NST/AFI weekly 36+ weeks (36[] , 37[] , 38[] , 39[] , 40[] ) [ ]  IOL by 41 weeks (scheduled, prn [] )       History of cesarean section complicating pregnancy 11/15/2018 by , MD No   Hypothyroid in pregnancy, antepartum 11/15/2018 by , MD No       Preterm labor symptoms and general obstetric precautions including but not limited to vaginal bleeding, contractions, leaking of fluid and fetal movement were reviewed in detail with the patient. Please refer to After Visit Summary for other counseling recommendations.   - Increase Triseba to 43 units QHS - continue to work on transfer to .  Patient states she has not been called yet. Will have our transfer coordinator follow up. Tried to call today, but they were closed.  - NIPT today - patient states she will likely elect repeat c-section for MOD  Return in about 2 weeks (around 01/01/2019) for Routine Prenatal Appointment.  , MD, OB/GYN, [facility] 12/18/2018 4:37 PM

## 2018-12-23 LAB — MATERNIT 21 PLUS CORE, BLOOD
Fetal Fraction: 5
Result (T21): NEGATIVE
Trisomy 13 (Patau syndrome): NEGATIVE
Trisomy 18 (Edwards syndrome): NEGATIVE
Trisomy 21 (Down syndrome): NEGATIVE

## 2018-12-24 ENCOUNTER — Encounter (INDEPENDENT_AMBULATORY_CARE_PROVIDER_SITE_OTHER): Payer: BC Managed Care – PPO | Admitting: Physician Assistant

## 2018-12-24 NOTE — Progress Notes (Signed)
Patient cancelled appointment on the same day.

## 2018-12-25 ENCOUNTER — Telehealth: Payer: Self-pay

## 2018-12-25 NOTE — Telephone Encounter (Signed)
Patient just rcvd a new Glucose monitor from the nutritionist. She contacted [facility] to be sure she was able to get strips for the meter. They do not have a rx for her meter. She has a rx for the one touch meter, but it's not the same. Requesting [phone]

## 2018-12-25 NOTE — Telephone Encounter (Signed)
Spoke w/patient. She had rx for One touch Ultra test strips. Her new monitor takes  OneTouch Verio  Blood Glucose Test Strips. Please send rx to [facility] for these strips. She was instructed to test qid.

## 2018-12-26 ENCOUNTER — Other Ambulatory Visit: Payer: Self-pay | Admitting: Obstetrics and Gynecology

## 2018-12-26 MED ORDER — GLUCOSE BLOOD VI STRP: ORAL_STRIP | 12 refills | Status: DC

## 2018-12-26 NOTE — Telephone Encounter (Signed)
Call dropped after patient answered

## 2018-12-26 NOTE — Telephone Encounter (Signed)
LMVM to notify patient previous call dropped and I was calling to notify her of requested rx has been sent in. Pt advised TRC if she needs further assistance.

## 2018-12-26 NOTE — Telephone Encounter (Signed)
Sent!

## 2018-12-30 ENCOUNTER — Other Ambulatory Visit: Payer: Self-pay | Admitting: Maternal & Fetal Medicine

## 2018-12-30 ENCOUNTER — Other Ambulatory Visit: Payer: Self-pay

## 2018-12-30 ENCOUNTER — Other Ambulatory Visit: Payer: Self-pay | Admitting: Obstetrics and Gynecology

## 2018-12-30 DIAGNOSIS — Z3689 Encounter for other specified antenatal screening: Secondary | ICD-10-CM

## 2018-12-30 DIAGNOSIS — O24011 Pre-existing diabetes mellitus, type 1, in pregnancy, first trimester: Secondary | ICD-10-CM

## 2019-01-02 ENCOUNTER — Ambulatory Visit
Admission: RE | Admit: 2019-01-02 | Discharge: 2019-01-02 | Disposition: A | Discharge status: Home / Self Care | Payer: BC Managed Care – PPO | Source: Ambulatory Visit | Attending: Maternal & Fetal Medicine | Admitting: Maternal & Fetal Medicine

## 2019-01-02 ENCOUNTER — Telehealth: Payer: Self-pay

## 2019-01-02 ENCOUNTER — Encounter: Payer: BC Managed Care – PPO | Admitting: Obstetrics and Gynecology

## 2019-01-02 ENCOUNTER — Other Ambulatory Visit: Payer: Self-pay

## 2019-01-02 ENCOUNTER — Other Ambulatory Visit: Payer: Self-pay | Admitting: Maternal & Fetal Medicine

## 2019-01-02 DIAGNOSIS — O24011 Pre-existing diabetes mellitus, type 1, in pregnancy, first trimester: Secondary | ICD-10-CM

## 2019-01-02 DIAGNOSIS — Z3689 Encounter for other specified antenatal screening: Secondary | ICD-10-CM | POA: Insufficient documentation

## 2019-01-02 DIAGNOSIS — Z3A12 12 weeks gestation of pregnancy: Secondary | ICD-10-CM | POA: Diagnosis not present

## 2019-01-02 LAB — GLUCOSE, CAPILLARY: Glucose-Capillary: 98 mg/dL (ref 70–99)

## 2019-01-02 NOTE — Telephone Encounter (Signed)
Please advise 

## 2019-01-02 NOTE — Telephone Encounter (Signed)
Pt calling; all of a sudden in the last 92min pt got dizzy, nauseas, sleepy, body feels heavy.  Blood sugar was 134 about an hour after heating and apple and some cheese.  [phone]

## 2019-01-03 NOTE — Telephone Encounter (Signed)
Patient states that she feels a little better.  She has consumed a 1/2 gallon of water. So far she is unaware of any transfer process to [facility].  We will continue to work on that.  She was seen by [facility] yesterday in the [place] perinatal clinic by Dr. Diamantina Monks.  However, it appears the actual transfer has not happened yet.

## 2019-01-03 NOTE — Telephone Encounter (Signed)
Transfer was put in on 10/1, referral fax was confirmed. I was out of office 10/12-10/16 so I could not check on status. [facility] is only opened on Mon-Thurs. I have left a detailed voicemail with them to call me back on Monday so we can check the status of this transfer. I will keep you updated

## 2019-01-08 ENCOUNTER — Encounter: Payer: BC Managed Care – PPO | Admitting: Obstetrics and Gynecology

## 2019-01-22 NOTE — Telephone Encounter (Signed)
This patient is on my schedule for tomorrow. Have you heard anything else about her transfer? thanks

## 2019-01-22 NOTE — Telephone Encounter (Signed)
I had a very pleasant conversation with patient. Her first appointment at [facility] is Friday. She has fully transferred to [facility], but states you are so great she was trying to sneak in one last visit with you. She understands she really doesn't have to do that especially since her [facility] appointment is this week. She stated we can cancel it with you and she will keep [place] updated as far as her advancing pregnancy. She was thankful for the call and great care she has received here.

## 2019-01-22 NOTE — Telephone Encounter (Signed)
I spoke to caitlyn at [facility] in [place] about 2 weeks ago and everything was transferred. They have spoken to pt and everything.She has been completely transferred. I think front desk had her scheduled several appointments in advance. I will call [facility] today just to be 100% sure.

## 2019-01-22 NOTE — Telephone Encounter (Signed)
Thank you :)

## 2019-01-23 ENCOUNTER — Encounter: Payer: BC Managed Care – PPO | Admitting: Obstetrics and Gynecology

## 2019-01-27 ENCOUNTER — Other Ambulatory Visit: Payer: Self-pay

## 2019-01-30 ENCOUNTER — Inpatient Hospital Stay
Admission: RE | Admit: 2019-01-30 | Discharge: 2019-01-30 | Disposition: A | Discharge status: Home / Self Care | Payer: BC Managed Care – PPO | Source: Ambulatory Visit

## 2019-01-30 ENCOUNTER — Ambulatory Visit: Payer: BC Managed Care – PPO

## 2019-02-05 ENCOUNTER — Encounter: Payer: Self-pay | Admitting: Primary Care

## 2019-02-05 ENCOUNTER — Ambulatory Visit: Payer: BC Managed Care – PPO | Admitting: Primary Care

## 2019-02-05 ENCOUNTER — Other Ambulatory Visit: Payer: Self-pay

## 2019-02-05 DIAGNOSIS — E039 Hypothyroidism, unspecified: Secondary | ICD-10-CM

## 2019-02-05 DIAGNOSIS — O24112 Pre-existing diabetes mellitus, type 2, in pregnancy, second trimester: Secondary | ICD-10-CM

## 2019-02-05 DIAGNOSIS — O99280 Endocrine, nutritional and metabolic diseases complicating pregnancy, unspecified trimester: Secondary | ICD-10-CM | POA: Diagnosis not present

## 2019-02-05 NOTE — Progress Notes (Signed)
Subjective:    Patient ID: Katie [name], female    DOB: 1990/04/24, 28 y.o.   MRN: [id number]  HPI  Katie [name] is a 28 year old female who presents today to establish care and discuss the problems mentioned below. Will obtain/review records.  1) Type 2 Diabetes: History of gestational diabetes from her initial pregnancy. Currently managed on Basaglar 46 units daily, Humalog 6-8 units TID, metformin XR 2000 mg daily. Following with perinatology and her last A1C was 6.8.  2) Hypothyroidism: Currently managed on levothyroxine 50 mcg. Last TSH in the system was 2.89. she is taking her levothyroxine in the morning, sometimes without water, also taking with her metformin.  BP Readings from Last 3 Encounters:  02/05/19 124/74  01/02/19 123/77  12/18/18 125/71     Review of Systems  Eyes: Negative for visual disturbance.  Respiratory: Negative for shortness of breath.   Cardiovascular: Negative for chest pain.  Neurological: Negative for dizziness and numbness.       Past Medical History:  Diagnosis Date  . Diabetes mellitus without complication (HCC)   . Family history of breast cancer    9/20 cancer genetic testing letter sent  . Thyroid disease      Social History   Socioeconomic History  . Marital status: Married    Spouse name: Ryheam Sales promotion account executive  . Number of children: Not on file  . Years of education: Not on file  . Highest education level: Not on file  Occupational History  . Occupation: EC Teacher  Social Needs  . Financial resource strain: Not on file  . Food insecurity    Worry: Not on file    Inability: Not on file  . Transportation needs    Medical: Not on file    Non-medical: Not on file  Tobacco Use  . Smoking status: Never Smoker  . Smokeless tobacco: Never Used  Substance and Sexual Activity  . Alcohol use: No  . Drug use: No  . Sexual activity: Yes    Partners: Male    Birth control/protection: None  Lifestyle  . Physical activity     Days per week: Not on file    Minutes per session: Not on file  . Stress: Not on file  Relationships  . Social Musician on phone: Not on file    Gets together: Not on file    Attends religious service: Not on file    Active member of club or organization: Not on file    Attends meetings of clubs or organizations: Not on file    Relationship status: Not on file  . Intimate partner violence    Fear of current or ex partner: Not on file    Emotionally abused: Not on file    Physically abused: Not on file    Forced sexual activity: Not on file  Other Topics Concern  . Not on file  Social History Narrative   Works as a Runner, 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   Enjoys hiking, spending time with family, concerts    Past Surgical History:  Procedure Laterality Date  . CESAREAN SECTION      Family History  Problem Relation Age of Onset  . Diabetes Mother   . Hypertension Mother   . Hypothyroidism Mother   . Diabetes Father   . Kidney disease Maternal Grandmother   . Leukemia Maternal Grandfather   . Diabetes Paternal Grandmother   . Breast cancer Maternal Aunt  63       has contact    No Known Allergies  Current Outpatient Medications on File Prior to Visit  Medication Sig Dispense Refill  . folic acid (FOLVITE) 1 MG tablet Take 1 mg by mouth daily.    Marland Kitchen glucose blood test strip Use as instructed 100 each 12  . HUMALOG KWIKPEN 100 UNIT/ML KwikPen     . Insulin Glargine (BASAGLAR KWIKPEN) 100 UNIT/ML SOPN SMARTSIG:46 Unit(s) SUB-Q Every Night    . insulin lispro (HUMALOG) 100 UNIT/ML injection Take 4 u SQ with breakfast, lunch and dinner (Patient taking differently: Inject 6-8 Units into the skin 3 (three) times daily with meals. Take 4 u SQ with breakfast, lunch and dinner) 10 mL 3  . Insulin Pen Needle (NOVOFINE) 30G X 8 MM MISC Inject 10 each into the skin daily. 90 each 1  . levothyroxine (SYNTHROID) 50 MCG tablet Take by mouth.    . metFORMIN  (GLUCOPHAGE XR) 500 MG 24 hr tablet Take 2 tablets (1,000 mg total) by mouth 2 (two) times daily. (Patient taking differently: Take 1,000 mg by mouth 2 (two) times daily. 2,000 total daily as of 12/05/18) 360 tablet 0  . Prenatal Vit-Fe Fumarate-FA (PRENATAL MULTIVITAMIN) TABS Take 1 tablet by mouth daily.     No current facility-administered medications on file prior to visit.     BP 124/74   Pulse 82   Temp (!) 97.3 F (36.3 C) (Temporal)   Ht 5\' 2"  (1.575 m)   Wt 259 lb 8 oz (117.7 kg)   LMP 10/07/2018 (Exact Date)   SpO2 98%   BMI 47.46 kg/m    Objective:   Physical Exam  Constitutional: She appears well-nourished.  Neck: Neck supple.  Cardiovascular: Normal rate and regular rhythm.  Respiratory: Effort normal and breath sounds normal.  Skin: Skin is warm and dry.  Psychiatric: She has a normal mood and affect.           Assessment & Plan:

## 2019-02-05 NOTE — Patient Instructions (Addendum)
Be sure to take your levothyroxine (thyroid medication) every morning on an empty stomach with water only. No food or other medications for 30 minutes. No heartburn medication, iron pills, calcium, vitamin D, or magnesium pills within four hours of taking levothyroxine.   It was a pleasure to meet you today! Please don't hesitate to call or message me with any questions. Welcome to [facility]!

## 2019-02-05 NOTE — Assessment & Plan Note (Signed)
Following with [facility] every two weeks. Recent A1C of 6.8.  Continue current regimen.

## 2019-02-05 NOTE — Assessment & Plan Note (Addendum)
Compliant to levothyroxine but taking inappropriately. Discussed proper instructions for taking levothyroxine. Recent TSH stable per care everywhere.

## 2019-04-26 ENCOUNTER — Other Ambulatory Visit: Payer: Self-pay

## 2019-04-26 DIAGNOSIS — Z794 Long term (current) use of insulin: Secondary | ICD-10-CM

## 2019-04-26 DIAGNOSIS — E1100 Type 2 diabetes mellitus with hyperosmolarity without nonketotic hyperglycemic-hyperosmolar coma (NKHHC): Secondary | ICD-10-CM

## 2019-04-26 NOTE — Telephone Encounter (Signed)
Pt requesting refill on expired medication- Metformin- Pharmacy stated pt not found (due to expired med?)- Routing to [facility] Patient Information  Patient Name Sex DOB SSN Address Phone  [name], Katie [name] 05-19-1990 Not Available [address]  [place] [id number] [phone] [facility])  Order Details  Medication NDC Prescription # DAW  metFORMIN HCl ER 500 MG Oral Tablet Extended Release 24 Hour [id number] No  Written Date Last Fill Date Quantity Days Supply  06/10/2018 11/12/2018 360 tablet 90  Sig Notes  Take 2 tablets by mouth twice daily Not Available  Provider Information  Prescribing Provider License DEA #  Osvaldo Angst  Not Available [id number]  Pharmacy Information  Pharmacy NCPDP ID Address Phone  [facility] [phone] [address] [place] [address] [phone] [facility])

## 2019-04-28 NOTE — Telephone Encounter (Signed)
Was last prescribed by prior pcp. Last appointment was est care on 02/05/2019

## 2019-04-28 NOTE — Telephone Encounter (Signed)
Spoken to patient and she stated that she taking Basaglar and metformin for her diabetes.  Basaglar - injecting 46 unit at bedtime.  Metformin 500 mg - taking 2 tablets by mouth BID  Follow up have been schedule on 05/05/2019

## 2019-04-28 NOTE — Addendum Note (Signed)
Addended by: Doreene Nest on: 04/28/2019 04:51 PM   Modules accepted: Orders

## 2019-04-28 NOTE — Telephone Encounter (Signed)
Please call patient and ask her what she's currently taking for diabetes, take off what she is no longer taking. Update me on her metformin dose. 2 tablets BID?  Needs diabetes follow up in the office. Please schedule.

## 2019-04-28 NOTE — Telephone Encounter (Signed)
For some reason it's not letting me e-scribe, can you take a look? Okay to send in 2 tablets BID, #360, 1 refill.

## 2019-04-28 NOTE — Addendum Note (Signed)
Addended by: Tawnya Crook on: 04/28/2019 10:03 AM   Modules accepted: Orders

## 2019-04-29 MED ORDER — METFORMIN HCL ER 500 MG PO TB24: 1000.0000 mg | ORAL_TABLET | Freq: Two times a day (BID) | ORAL | 1 refills | Status: DC

## 2019-04-29 NOTE — Addendum Note (Signed)
Addended by: Tawnya Crook on: 04/29/2019 12:56 PM   Modules accepted: Orders

## 2019-04-30 MED ORDER — METFORMIN HCL ER 500 MG PO TB24: 1000.0000 mg | ORAL_TABLET | Freq: Two times a day (BID) | ORAL | 1 refills | Status: DC

## 2019-05-05 ENCOUNTER — Encounter: Payer: Self-pay | Admitting: Primary Care

## 2019-05-05 ENCOUNTER — Other Ambulatory Visit: Payer: Self-pay

## 2019-05-05 ENCOUNTER — Ambulatory Visit: Payer: BC Managed Care – PPO | Admitting: Primary Care

## 2019-05-05 VITALS — BP 124/80 | HR 78 | Temp 97.2°F | Ht 62.0 in | Wt 265.4 lb

## 2019-05-05 DIAGNOSIS — E039 Hypothyroidism, unspecified: Secondary | ICD-10-CM

## 2019-05-05 DIAGNOSIS — E119 Type 2 diabetes mellitus without complications: Secondary | ICD-10-CM

## 2019-05-05 DIAGNOSIS — Z794 Long term (current) use of insulin: Secondary | ICD-10-CM | POA: Diagnosis not present

## 2019-05-05 DIAGNOSIS — O99280 Endocrine, nutritional and metabolic diseases complicating pregnancy, unspecified trimester: Secondary | ICD-10-CM

## 2019-05-05 DIAGNOSIS — E1100 Type 2 diabetes mellitus with hyperosmolarity without nonketotic hyperglycemic-hyperosmolar coma (NKHHC): Secondary | ICD-10-CM | POA: Diagnosis not present

## 2019-05-05 LAB — POCT GLYCOSYLATED HEMOGLOBIN (HGB A1C): Hemoglobin A1C: 7.1 % — AB (ref 4.0–5.6)

## 2019-05-05 MED ORDER — BASAGLAR KWIKPEN 100 UNIT/ML ~~LOC~~ SOPN: 24.0000 [IU] | PEN_INJECTOR | Freq: Two times a day (BID) | SUBCUTANEOUS | 3 refills | Status: DC

## 2019-05-05 NOTE — Assessment & Plan Note (Signed)
Taking levothyroxine correctly, TSH is UTD.

## 2019-05-05 NOTE — Progress Notes (Signed)
Subjective:    Patient ID: Katie [name], female    DOB: [birth year], 29 y.o.   MRN: [id number]  HPI  This visit occurred during the SARS-CoV-2 public health emergency.  Safety protocols were in place, including screening questions prior to the visit, additional usage of staff PPE, and extensive cleaning of exam room while observing appropriate contact time as indicated for disinfecting solutions.   Katie [name] is a 29 year old female with a history of type 2 diabetes, hypothyroidism who presents today for follow up.  1) Type 2 Diabetes:  Current medications include: Metformin XR 1000 mg BID, Basaglar 46 each evening. She forgets her morning dose of Metformin three times weekly.   She has a history of recurrent vaginal yeast infections when blood sugars were more uncontrolled. She was last treated with diflucan in 2019. She has noticed some vaginal itching.   She admits to an unhealthy diet and is not exercising. She plans on getting herself back on track.   She is checking her blood glucose 1 time daily and is getting readings of:  AM fasting: 120's-170's 2 hours after lunch: 192, 200 Bedtime: 282  Last A1C: 7.1 today, 6.8 in November 2020 Last Eye Exam: She will schedule  Last Foot Exam: Due next visit. Pneumonia Vaccination: Completed in 2020 ACE/ARB: None. Urine microalbumin negative in 2020 Statin: None.   2) Hypothyroidism: Currently managed on levothyroxine 50 mcg. Her last TSH was 2.89 in December 2020. She takes her levothyroxine every morning with water only, no food or other medications for 30 min.   BP Readings from Last 3 Encounters:  05/05/19 124/80  02/05/19 124/74  01/02/19 123/77      Review of Systems  Eyes: Negative for visual disturbance.  Respiratory: Negative for shortness of breath.   Cardiovascular: Negative for chest pain.  Neurological: Negative for dizziness and headaches.       Past Medical History:  Diagnosis Date  . Diabetes  mellitus without complication ([place])   . Family history of breast cancer    9/20 cancer genetic testing letter sent  . Thyroid disease      Social History   Socioeconomic History  . Marital status: Married    Spouse name: Ryheam Insurance risk surveyor  . Number of children: Not on file  . Years of education: Not on file  . Highest education level: Not on file  Occupational History  . Occupation: EC Teacher  Tobacco Use  . Smoking status: Never Smoker  . Smokeless tobacco: Never Used  Substance and Sexual Activity  . Alcohol use: No  . Drug use: No  . Sexual activity: Yes    Partners: Male    Birth control/protection: None  Other Topics Concern  . 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   Enjoys hiking, spending time with family, concerts   Social Determinants of Health   Financial Resource Strain:   . Difficulty of Paying Living Expenses: Not on file  Food Insecurity:   . Worried About Charity fundraiser in the Last Year: Not on file  . Ran Out of Food in the Last Year: Not on file  Transportation Needs:   . Lack of Transportation (Medical): Not on file  . Lack of Transportation (Non-Medical): Not on file  Physical Activity:   . Days of Exercise per Week: Not on file  . Minutes of Exercise per Session: Not on file  Stress:   . Feeling of Stress : Not on file  Social Connections:   . Frequency of Communication with Friends and Family: Not on file  . Frequency of Social Gatherings with Friends and Family: Not on file  . Attends Religious Services: Not on file  . Active Member of Clubs or Organizations: Not on file  . Attends Banker Meetings: Not on file  . Marital Status: Not on file  Intimate Partner Violence:   . Fear of Current or Ex-Partner: Not on file  . Emotionally Abused: Not on file  . Physically Abused: Not on file  . Sexually Abused: Not on file    Past Surgical History:  Procedure  Laterality Date  . CESAREAN SECTION      Family History  Problem Relation Age of Onset  . Diabetes Mother   . Hypertension Mother   . Hypothyroidism Mother   . Diabetes Father   . Kidney disease Maternal Grandmother   . Leukemia Maternal Grandfather   . Diabetes Paternal Grandmother   . Breast cancer Maternal Aunt 43       has contact    No Known Allergies  Current Outpatient Medications on File Prior to Visit  Medication Sig Dispense Refill  . glucose blood test strip Use as instructed 100 each 12  . Insulin Pen Needle (NOVOFINE) 30G X 8 MM MISC Inject 10 each into the skin daily. 90 each 1  . levothyroxine (SYNTHROID) 50 MCG tablet Take 50 mcg by mouth daily before breakfast.     . metFORMIN (GLUCOPHAGE-XR) 500 MG 24 hr tablet Take 2 tablets (1,000 mg total) by mouth 2 (two) times daily. For diabetes. 360 tablet 1   No current facility-administered medications on file prior to visit.    BP 124/80   Pulse 78   Temp (!) 97.2 F (36.2 C) (Temporal)   Ht 5\' 2"  (1.575 m)   Wt 265 lb 7 oz (120.4 kg)   LMP 03/26/2019   SpO2 98%   Breastfeeding Unknown   BMI 48.55 kg/m    Objective:   Physical Exam  Constitutional: She appears well-nourished.  Cardiovascular: Normal rate and regular rhythm.  Respiratory: Effort normal and breath sounds normal.  Musculoskeletal:     Cervical back: Neck supple.  Skin: Skin is warm and dry.  Psychiatric: She has a normal mood and affect.           Assessment & Plan:

## 2019-05-05 NOTE — Assessment & Plan Note (Signed)
A1C today of 7.1 which is under decent control but not yet at goal. Would like to see her below 6.5 given her age.   Discussed importance of compliance to Metformin, she will try harder. Also discussed the importance of a healthy diet, regular exercise, weight loss.   Will divide her Basaglar in half given the relatively large dose HS. Increase to 24 units BID.   We will plan to see her back in 1 month with more glucose readings, she will start checking at least twice daily.

## 2019-05-05 NOTE — Patient Instructions (Signed)
We've changed your insulin to 24 units twice daily.  Continue metformin 1000 mg twice daily.  It is important that you improve your diet. Please limit carbohydrates in the form of white bread, rice, pasta, sweets, fast food, fried food, sugary drinks, etc. Increase your consumption of fresh fruits and vegetables, whole grains, lean protein.  Ensure you are consuming 64 ounces of water daily.  Please schedule a follow up appointment in 1 month for diabetes check.  It was a pleasure to see you today!   Diabetes Mellitus and Nutrition, Adult When you have diabetes (diabetes mellitus), it is very important to have healthy eating habits because your blood sugar (glucose) levels are greatly affected by what you eat and drink. Eating healthy foods in the appropriate amounts, at about the same times every day, can help you:  Control your blood glucose.  Lower your risk of heart disease.  Improve your blood pressure.  Reach or maintain a healthy weight. Every person with diabetes is different, and each person has different needs for a meal plan. Your health care provider may recommend that you work with a diet and nutrition specialist (dietitian) to make a meal plan that is best for you. Your meal plan may vary depending on factors such as:  The calories you need.  The medicines you take.  Your weight.  Your blood glucose, blood pressure, and cholesterol levels.  Your activity level.  Other health conditions you have, such as heart or kidney disease. How do carbohydrates affect me? Carbohydrates, also called carbs, affect your blood glucose level more than any other type of food. Eating carbs naturally raises the amount of glucose in your blood. Carb counting is a method for keeping track of how many carbs you eat. Counting carbs is important to keep your blood glucose at a healthy level, especially if you use insulin or take certain oral diabetes medicines. It is important to know how  many carbs you can safely have in each meal. This is different for every person. Your dietitian can help you calculate how many carbs you should have at each meal and for each snack. Foods that contain carbs include:  Bread, cereal, rice, pasta, and crackers.  Potatoes and corn.  Peas, beans, and lentils.  Milk and yogurt.  Fruit and juice.  Desserts, such as cakes, cookies, ice cream, and candy. How does alcohol affect me? Alcohol can cause a sudden decrease in blood glucose (hypoglycemia), especially if you use insulin or take certain oral diabetes medicines. Hypoglycemia can be a life-threatening condition. Symptoms of hypoglycemia (sleepiness, dizziness, and confusion) are similar to symptoms of having too much alcohol. If your health care provider says that alcohol is safe for you, follow these guidelines:  Limit alcohol intake to no more than 1 drink per day for nonpregnant women and 2 drinks per day for men. One drink equals 12 oz of beer, 5 oz of wine, or 1 oz of hard liquor.  Do not drink on an empty stomach.  Keep yourself hydrated with water, diet soda, or unsweetened iced tea.  Keep in mind that regular soda, juice, and other mixers may contain a lot of sugar and must be counted as carbs. What are tips for following this plan?  Reading food labels  Start by checking the serving size on the "Nutrition Facts" label of packaged foods and drinks. The amount of calories, carbs, fats, and other nutrients listed on the label is based on one serving of the item.  Many items contain more than one serving per package.  Check the total grams (g) of carbs in one serving. You can calculate the number of servings of carbs in one serving by dividing the total carbs by 15. For example, if a food has 30 g of total carbs, it would be equal to 2 servings of carbs.  Check the number of grams (g) of saturated and trans fats in one serving. Choose foods that have low or no amount of these  fats.  Check the number of milligrams (mg) of salt (sodium) in one serving. Most people should limit total sodium intake to less than 2,300 mg per day.  Always check the nutrition information of foods labeled as "low-fat" or "nonfat". These foods may be higher in added sugar or refined carbs and should be avoided.  Talk to your dietitian to identify your daily goals for nutrients listed on the label. Shopping  Avoid buying canned, premade, or processed foods. These foods tend to be high in fat, sodium, and added sugar.  Shop around the outside edge of the grocery store. This includes fresh fruits and vegetables, bulk grains, fresh meats, and fresh dairy. Cooking  Use low-heat cooking methods, such as baking, instead of high-heat cooking methods like deep frying.  Cook using healthy oils, such as olive, canola, or sunflower oil.  Avoid cooking with butter, cream, or high-fat meats. Meal planning  Eat meals and snacks regularly, preferably at the same times every day. Avoid going long periods of time without eating.  Eat foods high in fiber, such as fresh fruits, vegetables, beans, and whole grains. Talk to your dietitian about how many servings of carbs you can eat at each meal.  Eat 4-6 ounces (oz) of lean protein each day, such as lean meat, chicken, fish, eggs, or tofu. One oz of lean protein is equal to: ? 1 oz of meat, chicken, or fish. ? 1 egg. ?  cup of tofu.  Eat some foods each day that contain healthy fats, such as avocado, nuts, seeds, and fish. Lifestyle  Check your blood glucose regularly.  Exercise regularly as told by your health care provider. This may include: ? 150 minutes of moderate-intensity or vigorous-intensity exercise each week. This could be brisk walking, biking, or water aerobics. ? Stretching and doing strength exercises, such as yoga or weightlifting, at least 2 times a week.  Take medicines as told by your health care provider.  Do not use any  products that contain nicotine or tobacco, such as cigarettes and e-cigarettes. If you need help quitting, ask your health care provider.  Work with a Social worker or diabetes educator to identify strategies to manage stress and any emotional and social challenges. Questions to ask a health care provider  Do I need to meet with a diabetes educator?  Do I need to meet with a dietitian?  What number can I call if I have questions?  When are the best times to check my blood glucose? Where to find more information:  American Diabetes Association: diabetes.org  Academy of Nutrition and Dietetics: www.eatright.[employer] of Diabetes and Digestive and Kidney Diseases (NIH): [employer] Summary  A healthy meal plan will help you control your blood glucose and maintain a healthy lifestyle.  Working with a diet and nutrition specialist (dietitian) can help you make a meal plan that is best for you.  Keep in mind that carbohydrates (carbs) and alcohol have immediate effects on your blood glucose levels. It is  important to count carbs and to use alcohol carefully. This information is not intended to replace advice given to you by your health care provider. Make sure you discuss any questions you have with your health care provider. Document Revised: 02/09/2017 Document Reviewed: 04/03/2016 Elsevier Patient Education  2020 [employer].

## 2019-05-17 ENCOUNTER — Other Ambulatory Visit: Payer: Self-pay

## 2019-05-17 ENCOUNTER — Ambulatory Visit: Payer: BC Managed Care – PPO | Attending: Internal Medicine

## 2019-05-17 DIAGNOSIS — Z23 Encounter for immunization: Secondary | ICD-10-CM | POA: Insufficient documentation

## 2019-05-17 NOTE — Progress Notes (Signed)
   Covid-19 Vaccination Clinic  Name:  Katie [name]    MRN: [id number] DOB: 1991-01-16  05/17/2019  [name] was observed post Covid-19 immunization for 15 minutes without incident. She was provided with Vaccine Information Sheet and instruction to access the V-Safe system.   [name] was instructed to call 911 with any severe reactions post vaccine: Marland Kitchen Difficulty breathing  . Swelling of face and throat  . A fast heartbeat  . A bad rash all over body  . Dizziness and weakness   Immunizations Administered    Name Date Dose VIS Date Route   Moderna COVID-19 Vaccine 05/17/2019  2:06 PM 0.5 mL 02/11/2019 Intramuscular   Manufacturer: Moderna   Lot: [id number]   NDC: 34621-947-12

## 2019-05-21 DIAGNOSIS — E119 Type 2 diabetes mellitus without complications: Secondary | ICD-10-CM

## 2019-05-22 MED ORDER — LIRAGLUTIDE 18 MG/3ML ~~LOC~~ SOPN: 0.6000 mg | PEN_INJECTOR | Freq: Every day | SUBCUTANEOUS | 0 refills | Status: DC

## 2019-06-03 ENCOUNTER — Ambulatory Visit: Payer: BC Managed Care – PPO | Admitting: Primary Care

## 2019-06-09 ENCOUNTER — Ambulatory Visit (INDEPENDENT_AMBULATORY_CARE_PROVIDER_SITE_OTHER): Payer: BC Managed Care – PPO | Admitting: Primary Care

## 2019-06-09 ENCOUNTER — Encounter: Payer: Self-pay | Admitting: Primary Care

## 2019-06-09 ENCOUNTER — Other Ambulatory Visit: Payer: Self-pay

## 2019-06-09 DIAGNOSIS — E119 Type 2 diabetes mellitus without complications: Secondary | ICD-10-CM | POA: Diagnosis not present

## 2019-06-09 DIAGNOSIS — Z794 Long term (current) use of insulin: Secondary | ICD-10-CM | POA: Diagnosis not present

## 2019-06-09 MED ORDER — LIRAGLUTIDE 18 MG/3ML ~~LOC~~ SOPN: 1.2000 mg | PEN_INJECTOR | Freq: Every day | SUBCUTANEOUS | 5 refills | Status: DC

## 2019-06-09 NOTE — Progress Notes (Signed)
Subjective:    Patient ID: Katie [name], female    DOB: 04-25-1990, 29 y.o.   MRN: [id number]  HPI  This visit occurred during the SARS-CoV-2 public health emergency.  Safety protocols were in place, including screening questions prior to the visit, additional usage of staff PPE, and extensive cleaning of exam room while observing appropriate contact time as indicated for disinfecting solutions.   Katie [name] is a 29 year old female with a history of type 2 diabetes, hypothyroidism, morbid obesity who presents today for follow up of diabetes.  Current medications include: Metformin XR 1000 mg BID, Victoza 0.6 ml daily, Basaglar 24 units BID.   She is checking her blood glucose 2 times daily and is getting readings of:  AM fasting: 201, 179, 221, 154, 139, 168, 178, 198, 180, 168, 149, 231, 236, 178  2 hours after breakfast: high 100's  Highest reading: 240 Lowest reading: 139  Last A1C: 7.1 in late February  Last Eye Exam: Due in June 2021 Last Foot Exam: Due today Pneumonia Vaccination: ACE/ARB: None.  Statin: None, child bearing age.  She endorses a poor diet. She started exercising twice weekly.   BP Readings from Last 3 Encounters:  06/09/19 130/76  05/05/19 124/80  02/05/19 124/74      Review of Systems  Eyes: Negative for visual disturbance.  Respiratory: Negative for shortness of breath.   Cardiovascular: Negative for chest pain.  Neurological: Negative for dizziness, numbness and headaches.       Past Medical History:  Diagnosis Date  . Diabetes mellitus without complication ([place])   . Family history of breast cancer    9/20 cancer genetic testing letter sent  . History of cesarean section complicating pregnancy 04/15/4823  . History of preterm delivery 12/05/2018   Recommend serial cervical length screening starting at 16 weeks Refer for possible cerclage if cervical length < 2.5 cm  . Supervision of high risk pregnancy in first trimester 11/15/2018   [facility]inic [facility] Prenatal Labs Dating  Blood type: B/Positive/-- (09/04 1216)  Genetic Screen 1 Screen:    AFP:     Quad:     NIPS: Antibody:Negative (09/04 1216) Anatomic [place]  Rubella: 5.05 (09/04 1216) Varicella: @VZVIGG @ GTT Early:               Third trimester:  RPR: Non Reactive (09/04 1216)  Rhogam  HBsAg: Negative (09/04 1216)  TDaP vaccine                       Flu Shot: HIV: Non Reactive (  . Thyroid disease   . Type 2 diabetes mellitus with hyperosmolarity without coma, with long-term current use of insulin ([place]) 08/03/2017     Social History   Socioeconomic History  . Marital status: Married    Spouse name: Ryheam Insurance risk surveyor  . Number of children: Not on file  . Years of education: Not on file  . Highest education level: Not on file  Occupational History  . Occupation: EC Teacher  Tobacco Use  . Smoking status: Never Smoker  . Smokeless tobacco: Never Used  Substance and Sexual Activity  . Alcohol use: No  . Drug use: No  . Sexual activity: Yes    Partners: Male    Birth control/protection: None  Other Topics Concern  . 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   Enjoys hiking, spending time with  family, concerts   Social Determinants of Health   Financial Resource Strain:   . Difficulty of Paying Living Expenses:   Food Insecurity:   . Worried About Programme researcher, broadcasting/film/video in the Last Year:   . Barista in the Last Year:   Transportation Needs:   . Freight forwarder (Medical):   Marland Kitchen Lack of Transportation (Non-Medical):   Physical Activity:   . Days of Exercise per Week:   . Minutes of Exercise per Session:   Stress:   . Feeling of Stress :   Social Connections:   . Frequency of Communication with Friends and Family:   . Frequency of Social Gatherings with Friends and Family:   . Attends Religious Services:   . Active Member of Clubs or Organizations:   . Attends Banker Meetings:    Marland Kitchen Marital Status:   Intimate Partner Violence:   . Fear of Current or Ex-Partner:   . Emotionally Abused:   Marland Kitchen Physically Abused:   . Sexually Abused:     Past Surgical History:  Procedure Laterality Date  . CESAREAN SECTION      Family History  Problem Relation Age of Onset  . Diabetes Mother   . Hypertension Mother   . Hypothyroidism Mother   . Diabetes Father   . Kidney disease Maternal Grandmother   . Leukemia Maternal Grandfather   . Diabetes Paternal Grandmother   . Breast cancer Maternal Aunt 43       has contact    No Known Allergies  Current Outpatient Medications on File Prior to Visit  Medication Sig Dispense Refill  . glucose blood test strip Use as instructed 100 each 12  . Insulin Glargine (BASAGLAR KWIKPEN) 100 UNIT/ML SOPN Inject 0.24 mLs (24 Units total) into the skin 2 (two) times daily. 15 mL 3  . Insulin Pen Needle (NOVOFINE) 30G X 8 MM MISC Inject 10 each into the skin daily. 90 each 1  . levothyroxine (SYNTHROID) 50 MCG tablet Take 50 mcg by mouth daily before breakfast.     . liraglutide (VICTOZA) 18 MG/3ML SOPN Inject 0.1 mLs (0.6 mg total) into the skin daily. Daily for diabetes. 3 mL 0  . metFORMIN (GLUCOPHAGE-XR) 500 MG 24 hr tablet Take 2 tablets (1,000 mg total) by mouth 2 (two) times daily. For diabetes. 360 tablet 1   No current facility-administered medications on file prior to visit.    BP 130/76   Pulse 82   Temp (!) 96.6 F (35.9 C) (Temporal)   Ht 5\' 2"  (1.575 m)   Wt 263 lb 8 oz (119.5 kg)   SpO2 98%   BMI 48.19 kg/m    Objective:   Physical Exam  Constitutional: She appears well-nourished.  Cardiovascular: Normal rate and regular rhythm.  Respiratory: Effort normal and breath sounds normal.  Musculoskeletal:     Cervical back: Neck supple.  Skin: Skin is warm and dry.  Psychiatric: She has a normal mood and affect.           Assessment & Plan:

## 2019-06-09 NOTE — Assessment & Plan Note (Signed)
Glucose readings too high, would like to see her no higher than 120, discussed this with her today.  Increase Victoza to 1.2 mg daily.  Continue basaglar 24 units BID for now. Continue Metformin.  She would like to work on her diet, has recently started exercising.   If no improvement in glucose readings then would increase Basaglar to 28 units BID.  She will update.  Follow up in 3 months.

## 2019-06-09 NOTE — Patient Instructions (Addendum)
We've increased your dose of Victoza to 1.2 mg daily.   Continue Basaglar 24 units twice daily. Continue Metformin.  Please message me in two weeks if your glucose readings do not improve.   It is important that you improve your diet. Please limit carbohydrates in the form of white bread, rice, pasta, sweets, fast food, fried food, sugary drinks, etc. Increase your consumption of fresh fruits and vegetables, whole grains, lean protein.  Ensure you are consuming 64 ounces of water daily.  Continue exercising. You should be getting 150 minutes of moderate intensity exercise weekly.  Please schedule a follow up appointment in 3 months for diabetes check.  It was a pleasure to see you today!   Diabetes Mellitus and Nutrition, Adult When you have diabetes (diabetes mellitus), it is very important to have healthy eating habits because your blood sugar (glucose) levels are greatly affected by what you eat and drink. Eating healthy foods in the appropriate amounts, at about the same times every day, can help you:  Control your blood glucose.  Lower your risk of heart disease.  Improve your blood pressure.  Reach or maintain a healthy weight. Every person with diabetes is different, and each person has different needs for a meal plan. Your health care provider may recommend that you work with a diet and nutrition specialist (dietitian) to make a meal plan that is best for you. Your meal plan may vary depending on factors such as:  The calories you need.  The medicines you take.  Your weight.  Your blood glucose, blood pressure, and cholesterol levels.  Your activity level.  Other health conditions you have, such as heart or kidney disease. How do carbohydrates affect me? Carbohydrates, also called carbs, affect your blood glucose level more than any other type of food. Eating carbs naturally raises the amount of glucose in your blood. Carb counting is a method for keeping track of  how many carbs you eat. Counting carbs is important to keep your blood glucose at a healthy level, especially if you use insulin or take certain oral diabetes medicines. It is important to know how many carbs you can safely have in each meal. This is different for every person. Your dietitian can help you calculate how many carbs you should have at each meal and for each snack. Foods that contain carbs include:  Bread, cereal, rice, pasta, and crackers.  Potatoes and corn.  Peas, beans, and lentils.  Milk and yogurt.  Fruit and juice.  Desserts, such as cakes, cookies, ice cream, and candy. How does alcohol affect me? Alcohol can cause a sudden decrease in blood glucose (hypoglycemia), especially if you use insulin or take certain oral diabetes medicines. Hypoglycemia can be a life-threatening condition. Symptoms of hypoglycemia (sleepiness, dizziness, and confusion) are similar to symptoms of having too much alcohol. If your health care provider says that alcohol is safe for you, follow these guidelines:  Limit alcohol intake to no more than 1 drink per day for nonpregnant women and 2 drinks per day for men. One drink equals 12 oz of beer, 5 oz of wine, or 1 oz of hard liquor.  Do not drink on an empty stomach.  Keep yourself hydrated with water, diet soda, or unsweetened iced tea.  Keep in mind that regular soda, juice, and other mixers may contain a lot of sugar and must be counted as carbs. What are tips for following this plan?  Reading food labels  Start by checking the  serving size on the "Nutrition Facts" label of packaged foods and drinks. The amount of calories, carbs, fats, and other nutrients listed on the label is based on one serving of the item. Many items contain more than one serving per package.  Check the total grams (g) of carbs in one serving. You can calculate the number of servings of carbs in one serving by dividing the total carbs by 15. For example, if a  food has 30 g of total carbs, it would be equal to 2 servings of carbs.  Check the number of grams (g) of saturated and trans fats in one serving. Choose foods that have low or no amount of these fats.  Check the number of milligrams (mg) of salt (sodium) in one serving. Most people should limit total sodium intake to less than 2,300 mg per day.  Always check the nutrition information of foods labeled as "low-fat" or "nonfat". These foods may be higher in added sugar or refined carbs and should be avoided.  Talk to your dietitian to identify your daily goals for nutrients listed on the label. Shopping  Avoid buying canned, premade, or processed foods. These foods tend to be high in fat, sodium, and added sugar.  Shop around the outside edge of the grocery store. This includes fresh fruits and vegetables, bulk grains, fresh meats, and fresh dairy. Cooking  Use low-heat cooking methods, such as baking, instead of high-heat cooking methods like deep frying.  Cook using healthy oils, such as olive, canola, or sunflower oil.  Avoid cooking with butter, cream, or high-fat meats. Meal planning  Eat meals and snacks regularly, preferably at the same times every day. Avoid going long periods of time without eating.  Eat foods high in fiber, such as fresh fruits, vegetables, beans, and whole grains. Talk to your dietitian about how many servings of carbs you can eat at each meal.  Eat 4-6 ounces (oz) of lean protein each day, such as lean meat, chicken, fish, eggs, or tofu. One oz of lean protein is equal to: ? 1 oz of meat, chicken, or fish. ? 1 egg. ?  cup of tofu.  Eat some foods each day that contain healthy fats, such as avocado, nuts, seeds, and fish. Lifestyle  Check your blood glucose regularly.  Exercise regularly as told by your health care provider. This may include: ? 150 minutes of moderate-intensity or vigorous-intensity exercise each week. This could be brisk walking,  biking, or water aerobics. ? Stretching and doing strength exercises, such as yoga or weightlifting, at least 2 times a week.  Take medicines as told by your health care provider.  Do not use any products that contain nicotine or tobacco, such as cigarettes and e-cigarettes. If you need help quitting, ask your health care provider.  Work with a Veterinary surgeon or diabetes educator to identify strategies to manage stress and any emotional and social challenges. Questions to ask a health care provider  Do I need to meet with a diabetes educator?  Do I need to meet with a dietitian?  What number can I call if I have questions?  When are the best times to check my blood glucose? Where to find more information:  American Diabetes Association: diabetes.org  Academy of Nutrition and Dietetics: www.eatright.[employer] of Diabetes and Digestive and Kidney Diseases (NIH): CarFlippers.tn Summary  A healthy meal plan will help you control your blood glucose and maintain a healthy lifestyle.  Working with a diet and nutrition  specialist (dietitian) can help you make a meal plan that is best for you.  Keep in mind that carbohydrates (carbs) and alcohol have immediate effects on your blood glucose levels. It is important to count carbs and to use alcohol carefully. This information is not intended to replace advice given to you by your health care provider. Make sure you discuss any questions you have with your health care provider. Document Revised: 02/09/2017 Document Reviewed: 04/03/2016 Elsevier Patient Education  2020 [employer].

## 2019-06-14 ENCOUNTER — Ambulatory Visit: Payer: BC Managed Care – PPO | Attending: Internal Medicine

## 2019-06-14 DIAGNOSIS — Z23 Encounter for immunization: Secondary | ICD-10-CM

## 2019-06-14 NOTE — Progress Notes (Signed)
   Covid-19 Vaccination Clinic  Name:  Katie [name]    MRN: [id number] DOB: [birth year]  06/14/2019  Ms. Edge was observed post Covid-19 immunization for 15 minutes without incident. She was provided with Vaccine Information Sheet and instruction to access the V-Safe system.   [name] was instructed to call 911 with any severe reactions post vaccine: Marland Kitchen Difficulty breathing  . Swelling of face and throat  . A fast heartbeat  . A bad rash all over body  . Dizziness and weakness   Immunizations Administered    Name Date Dose VIS Date Route   Moderna COVID-19 Vaccine 06/14/2019  1:03 PM 0.5 mL 02/11/2019 Intramuscular   Manufacturer: Gala Murdoch   Lot: [id number]   NDC: 97948-016-55

## 2019-07-30 ENCOUNTER — Other Ambulatory Visit: Payer: Self-pay

## 2019-07-30 ENCOUNTER — Encounter: Payer: Self-pay | Admitting: Primary Care

## 2019-07-30 ENCOUNTER — Ambulatory Visit: Payer: BC Managed Care – PPO | Admitting: Primary Care

## 2019-07-30 VITALS — BP 122/78 | HR 79 | Temp 96.3°F | Ht 62.0 in | Wt 264.2 lb

## 2019-07-30 DIAGNOSIS — N898 Other specified noninflammatory disorders of vagina: Secondary | ICD-10-CM | POA: Diagnosis not present

## 2019-07-30 HISTORY — DX: Other specified noninflammatory disorders of vagina: N89.8

## 2019-07-30 LAB — POC URINALSYSI DIPSTICK (AUTOMATED)
Bilirubin, UA: NEGATIVE
Blood, UA: NEGATIVE
Glucose, UA: NEGATIVE
Ketones, UA: NEGATIVE
Nitrite, UA: NEGATIVE
Protein, UA: NEGATIVE
Spec Grav, UA: <=1.005 — AB (ref 1.010–1.025)
Urobilinogen, UA: 0.2 E.U./dL
pH, UA: 6 (ref 5.0–8.0)

## 2019-07-30 MED ORDER — FLUCONAZOLE 150 MG PO TABS: 150.0000 mg | ORAL_TABLET | Freq: Once | ORAL | 0 refills | Status: AC

## 2019-07-30 NOTE — Progress Notes (Signed)
Subjective:    Patient ID: Katie [name], female    DOB: 1990/07/25, 29 y.o.   MRN: [id number]  HPI  This visit occurred during the SARS-CoV-2 public health emergency.  Safety protocols were in place, including screening questions prior to the visit, additional usage of staff PPE, and extensive cleaning of exam room while observing appropriate contact time as indicated for disinfecting solutions.   Katie [name] is a 29 year old female with a history of type 2 diabetes, hypothyroidism, morbid obesity who presents today with a chief complaint of vaginal itching.   She also reports cloudy white vaginal discharge. Symptoms began three days ago. No recent antibiotic use. She denies dysuria, hematuria, urinary frequency. She has a history of vaginal yeast infections and has had to go to the ED in the past due to severe symptoms.   BP Readings from Last 3 Encounters:  07/30/19 122/78  06/09/19 130/76  05/05/19 124/80     Review of Systems  Constitutional: Negative for fever.  Gastrointestinal: Negative for abdominal pain and nausea.  Genitourinary: Positive for vaginal discharge. Negative for dysuria, frequency and hematuria.       Past Medical History:  Diagnosis Date  . Diabetes mellitus without complication ([place])   . Family history of breast cancer    9/20 cancer genetic testing letter sent  . History of cesarean section complicating pregnancy 11/15/6385  . History of preterm delivery 12/05/2018   Recommend serial cervical length screening starting at 16 weeks Refer for possible cerclage if cervical length < 2.5 cm  . Supervision of high risk pregnancy in first trimester 11/15/2018   Clinic [facility] Prenatal Labs Dating  Blood type: B/Positive/-- (09/04 1216)  Genetic Screen 1 Screen:    AFP:     Quad:     NIPS: Antibody:Negative (09/04 1216) Anatomic [place]  Rubella: 5.05 (09/04 1216) Varicella: @VZVIGG @ GTT Early:               Third trimester:  RPR: Non Reactive (09/04 1216)   Rhogam  HBsAg: Negative (09/04 1216)  TDaP vaccine                       Flu Shot: HIV: Non Reactive (  . Thyroid disease   . Type 2 diabetes mellitus with hyperosmolarity without coma, with long-term current use of insulin ([place]) 08/03/2017     Social History   Socioeconomic History  . Marital status: Married    Spouse name: Ryheam Insurance risk surveyor  . Number of children: Not on file  . Years of education: Not on file  . Highest education level: Not on file  Occupational History  . Occupation: EC Teacher  Tobacco Use  . Smoking status: Never Smoker  . Smokeless tobacco: Never Used  Substance and Sexual Activity  . Alcohol use: No  . Drug use: No  . Sexual activity: Yes    Partners: Male    Birth control/protection: None  Other Topics Concern  . 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   Enjoys hiking, spending time with family, concerts   Social Determinants of Health   Financial Resource Strain:   . Difficulty of Paying Living Expenses:   Food Insecurity:   . Worried About Charity fundraiser in the Last Year:   . Arboriculturist in the Last Year:   Transportation Needs:   . Lack of Transportation (  Medical):   Marland Kitchen Lack of Transportation (Non-Medical):   Physical Activity:   . Days of Exercise per Week:   . Minutes of Exercise per Session:   Stress:   . Feeling of Stress :   Social Connections:   . Frequency of Communication with Friends and Family:   . Frequency of Social Gatherings with Friends and Family:   . Attends Religious Services:   . Active Member of Clubs or Organizations:   . Attends Banker Meetings:   Marland Kitchen Marital Status:   Intimate Partner Violence:   . Fear of Current or Ex-Partner:   . Emotionally Abused:   Marland Kitchen Physically Abused:   . Sexually Abused:     Past Surgical History:  Procedure Laterality Date  . CESAREAN SECTION      Family History  Problem Relation Age of Onset  .  Diabetes Mother   . Hypertension Mother   . Hypothyroidism Mother   . Diabetes Father   . Kidney disease Maternal Grandmother   . Leukemia Maternal Grandfather   . Diabetes Paternal Grandmother   . Breast cancer Maternal Aunt 43       has contact    No Known Allergies  Current Outpatient Medications on File Prior to Visit  Medication Sig Dispense Refill  . glucose blood test strip Use as instructed 100 each 12  . Insulin Glargine (BASAGLAR KWIKPEN) 100 UNIT/ML SOPN Inject 0.24 mLs (24 Units total) into the skin 2 (two) times daily. 15 mL 3  . Insulin Pen Needle (NOVOFINE) 30G X 8 MM MISC Inject 10 each into the skin daily. 90 each 1  . levothyroxine (SYNTHROID) 50 MCG tablet Take 50 mcg by mouth daily before breakfast.     . liraglutide (VICTOZA) 18 MG/3ML SOPN Inject 0.2 mLs (1.2 mg total) into the skin daily. for diabetes. 6 mL 5  . metFORMIN (GLUCOPHAGE-XR) 500 MG 24 hr tablet Take 2 tablets (1,000 mg total) by mouth 2 (two) times daily. For diabetes. 360 tablet 1   No current facility-administered medications on file prior to visit.    BP 122/78   Pulse 79   Temp (!) 96.3 F (35.7 C) (Temporal)   Ht 5\' 2"  (1.575 m)   Wt 264 lb 4 oz (119.9 kg)   SpO2 98%   BMI 48.33 kg/m    Objective:   Physical Exam  Constitutional: She appears well-nourished.  Respiratory: Effort normal.  Genitourinary: Cervix exhibits discharge. Cervix exhibits no motion tenderness. Right adnexum displays no tenderness. Left adnexum displays no tenderness.    Vaginal discharge present.     No vaginal erythema.  No erythema in the vagina.    Genitourinary Comments: Moderate about of milky white discharge    Skin: Skin is warm and dry.           Assessment & Plan:

## 2019-07-30 NOTE — Assessment & Plan Note (Addendum)
Acute for the last three days, history of diabetes and vaginal yeast infections.   Exam today with moderate milky white discharge. Given itching, coupled with diabetes, vaginal discharge, will treat for presumed vaginal yeast infection.   UA today with 1+ leuks, negative otherwise. Culture sent.  Wet prep and gonorrhea/chamydia sent off for testing. Will treat with fluconazole 150 mg once. Await results.

## 2019-07-30 NOTE — Patient Instructions (Signed)
Take the fluconazole 150 mg tablet once.  I will be in touch tomorrow regarding your results.   It was a pleasure to see you today!

## 2019-07-31 ENCOUNTER — Telehealth: Payer: Self-pay | Admitting: Primary Care

## 2019-07-31 LAB — WET PREP BY MOLECULAR PROBE
Candida species: DETECTED — AB
Gardnerella vaginalis: NOT DETECTED
MICRO NUMBER:: 10496221
SPECIMEN QUALITY:: ADEQUATE
Trichomonas vaginosis: NOT DETECTED

## 2019-07-31 LAB — C. TRACHOMATIS/N. GONORRHOEAE RNA
C. trachomatis RNA, TMA: NOT DETECTED
N. gonorrhoeae RNA, TMA: NOT DETECTED

## 2019-07-31 NOTE — Telephone Encounter (Signed)
Patient called in regards to lab results. She would like to know if you have received the results

## 2019-08-01 ENCOUNTER — Other Ambulatory Visit: Payer: Self-pay | Admitting: Physician Assistant

## 2019-08-01 DIAGNOSIS — E039 Hypothyroidism, unspecified: Secondary | ICD-10-CM

## 2019-08-01 LAB — URINE CULTURE
MICRO NUMBER:: 10496222
Result:: NO GROWTH
SPECIMEN QUALITY:: ADEQUATE

## 2019-08-01 MED ORDER — LEVOTHYROXINE SODIUM 50 MCG PO TABS: ORAL_TABLET | ORAL | 0 refills | Status: DC

## 2019-08-01 NOTE — Telephone Encounter (Signed)
Did not see anything that had patient's name in the fax Rx request. Have not prescribed. Last OV on 07/30/2019. Next future OV scheduled on 09/09/2019

## 2019-08-01 NOTE — Telephone Encounter (Signed)
Spoken and notified patient of Kate Clark's comments. Patient verbalized understanding.  

## 2019-09-09 ENCOUNTER — Ambulatory Visit: Payer: BC Managed Care – PPO | Admitting: Primary Care

## 2019-09-29 ENCOUNTER — Other Ambulatory Visit: Payer: Self-pay | Admitting: Primary Care

## 2019-09-29 DIAGNOSIS — E1100 Type 2 diabetes mellitus with hyperosmolarity without nonketotic hyperglycemic-hyperosmolar coma (NKHHC): Secondary | ICD-10-CM

## 2019-09-29 NOTE — Telephone Encounter (Signed)
Last prescribed on 05/05/2019 Last OV (acute ) with Mayra Reel on 07/30/2019 No future OV scheduled

## 2019-09-29 NOTE — Telephone Encounter (Signed)
She is due for a diabetes follow up, please schedule, thanks!  Refill(s) sent to pharmacy.

## 2019-10-10 NOTE — Telephone Encounter (Signed)
Called patient to schedule appointment. Patient stated she has to check with husband to see when she can come in. Will call back to schedule.

## 2019-10-22 LAB — HM DIABETES EYE EXAM

## 2019-10-27 ENCOUNTER — Other Ambulatory Visit: Payer: Self-pay | Admitting: Primary Care

## 2019-10-31 ENCOUNTER — Encounter: Payer: Self-pay | Admitting: Primary Care

## 2019-11-06 ENCOUNTER — Other Ambulatory Visit: Payer: Self-pay | Admitting: Primary Care

## 2019-11-06 DIAGNOSIS — E1100 Type 2 diabetes mellitus with hyperosmolarity without nonketotic hyperglycemic-hyperosmolar coma (NKHHC): Secondary | ICD-10-CM

## 2019-11-23 ENCOUNTER — Other Ambulatory Visit: Payer: Self-pay | Admitting: Primary Care

## 2019-11-23 DIAGNOSIS — O99280 Endocrine, nutritional and metabolic diseases complicating pregnancy, unspecified trimester: Secondary | ICD-10-CM

## 2019-12-02 ENCOUNTER — Other Ambulatory Visit: Payer: Self-pay

## 2019-12-02 ENCOUNTER — Ambulatory Visit: Payer: BC Managed Care – PPO | Admitting: Primary Care

## 2019-12-02 ENCOUNTER — Encounter: Payer: Self-pay | Admitting: Primary Care

## 2019-12-02 VITALS — BP 122/78 | HR 81 | Temp 96.1°F | Ht 62.0 in | Wt 269.0 lb

## 2019-12-02 DIAGNOSIS — E119 Type 2 diabetes mellitus without complications: Secondary | ICD-10-CM | POA: Diagnosis not present

## 2019-12-02 DIAGNOSIS — Z1159 Encounter for screening for other viral diseases: Secondary | ICD-10-CM

## 2019-12-02 DIAGNOSIS — Z9189 Other specified personal risk factors, not elsewhere classified: Secondary | ICD-10-CM

## 2019-12-02 DIAGNOSIS — E1100 Type 2 diabetes mellitus with hyperosmolarity without nonketotic hyperglycemic-hyperosmolar coma (NKHHC): Secondary | ICD-10-CM

## 2019-12-02 DIAGNOSIS — E039 Hypothyroidism, unspecified: Secondary | ICD-10-CM

## 2019-12-02 DIAGNOSIS — Z794 Long term (current) use of insulin: Secondary | ICD-10-CM

## 2019-12-02 LAB — POCT GLYCOSYLATED HEMOGLOBIN (HGB A1C): Hemoglobin A1C: 8.1 % — AB (ref 4.0–5.6)

## 2019-12-02 LAB — COMPREHENSIVE METABOLIC PANEL
ALT: 15 U/L (ref 0–35)
AST: 15 U/L (ref 0–37)
Albumin: 4 g/dL (ref 3.5–5.2)
Alkaline Phosphatase: 90 U/L (ref 39–117)
BUN: 6 mg/dL (ref 6–23)
CO2: 27 mEq/L (ref 19–32)
Calcium: 9 mg/dL (ref 8.4–10.5)
Chloride: 103 mEq/L (ref 96–112)
Creatinine, Ser: 0.76 mg/dL (ref 0.40–1.20)
GFR: 109.2 mL/min (ref >60.00)
Glucose, Bld: 169 mg/dL — ABNORMAL HIGH (ref 70–99)
Potassium: 4.6 mEq/L (ref 3.5–5.1)
Sodium: 137 mEq/L (ref 135–145)
Total Bilirubin: 0.5 mg/dL (ref 0.2–1.2)
Total Protein: 7.1 g/dL (ref 6.0–8.3)

## 2019-12-02 LAB — MICROALBUMIN / CREATININE URINE RATIO
Creatinine,U: 145.7 mg/dL
Microalb Creat Ratio: 0.5 mg/g (ref 0.0–30.0)
Microalb, Ur: <0.7 mg/dL (ref 0.0–1.9)

## 2019-12-02 LAB — TSH: TSH: 3.86 u[IU]/mL (ref 0.35–4.50)

## 2019-12-02 MED ORDER — LIRAGLUTIDE 18 MG/3ML ~~LOC~~ SOPN: 1.2000 mg | PEN_INJECTOR | Freq: Every day | SUBCUTANEOUS | 5 refills | Status: DC

## 2019-12-02 NOTE — Assessment & Plan Note (Addendum)
She has been compliant to levothyroxine and taking correctly. She ran out of levothyroxine 2 days ago, refilled today.   TSH repeated today and pending.   Agree with assessment and plan. Doreene Nest, NP

## 2019-12-02 NOTE — Progress Notes (Signed)
Subjective:    Patient ID: Katie [name], female    DOB: [birth year], 29 y.o.   MRN: [id number]  HPI  This visit occurred during the SARS-CoV-2 public health emergency.  Safety protocols were in place, including screening questions prior to the visit, additional usage of staff PPE, and extensive cleaning of exam room while observing appropriate contact time as indicated for disinfecting solutions.   Katie [name] is a 29 year old female with a history of type 2 diabetes, hypothyroidism, obesity who presents today for follow up of diabetes and medication refills.  Current medications include: Metformin XR 1000 mg BID, Victoza 1.2 mg daily, Basaglar 24 units BID. She is missing her morning dose of Metformin 2-3 days weekly.   She is checking her blood glucose 0 times daily. She checks her AM fasting glucose once weekly which is running in the 140's.   She admits to a poor diet due to stress and her work life. She is eating a lot of vending machine food and is not exercising.   Last A1C: 7.1 in February 2021, 8.1 today.  Last Eye Exam: UTD Last Foot Exam: Due today Pneumonia Vaccination: Completed in 2020 ACE/ARB: None, urine microalbumin due Statin: None  BP Readings from Last 3 Encounters:  12/02/19 122/78  07/30/19 122/78  06/09/19 130/76     Review of Systems  Eyes: Negative for visual disturbance.  Respiratory: Negative for shortness of breath.   Cardiovascular: Negative for chest pain.  Neurological: Negative for dizziness and numbness.       Past Medical History:  Diagnosis Date  . Diabetes mellitus without complication ([facility])   . Family history of breast cancer    9/20 cancer genetic testing letter sent  . History of cesarean section complicating pregnancy 11/15/2018  . History of preterm delivery 12/05/2018   Recommend serial cervical length screening starting at 16 weeks Refer for possible cerclage if cervical length < 2.5 cm  . Supervision of high risk pregnancy  in first trimester 11/15/2018   Clinic [facility] Prenatal Labs Dating  Blood type: B/Positive/-- (09/04 1216)  Genetic Screen 1 Screen:    AFP:     Quad:     NIPS: Antibody:Negative (09/04 1216) Anatomic [place]  Rubella: 5.05 (09/04 1216) Varicella: @VZVIGG @ GTT Early:               Third trimester:  RPR: Non Reactive (09/04 1216)  Rhogam  HBsAg: Negative (09/04 1216)  TDaP vaccine                       Flu Shot: HIV: Non Reactive (  . Thyroid disease   . Type 2 diabetes mellitus with hyperosmolarity without coma, with long-term current use of insulin ([facility]) 08/03/2017     Social History   Socioeconomic History  . Marital status: Married    Spouse name: Ryheam 08/05/2017  . Number of children: Not on file  . Years of education: Not on file  . Highest education level: Not on file  Occupational History  . Occupation: EC Teacher  Tobacco Use  . Smoking status: Never Smoker  . Smokeless tobacco: Never Used  Vaping Use  . Vaping Use: Never used  Substance and Sexual Activity  . Alcohol use: No  . Drug use: No  . Sexual activity: Yes    Partners: Male    Birth control/protection: None  Other Topics Concern  . Not on file  Social History Narrative   Works  as a Runner, 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   Enjoys hiking, spending time with family, concerts   Social Determinants of Health   Financial Resource Strain:   . Difficulty of Paying Living Expenses: Not on file  Food Insecurity:   . Worried About Programme researcher, broadcasting/film/video in the Last Year: Not on file  . Ran Out of Food in the Last Year: Not on file  Transportation Needs:   . Lack of Transportation (Medical): Not on file  . Lack of Transportation (Non-Medical): Not on file  Physical Activity:   . Days of Exercise per Week: Not on file  . Minutes of Exercise per Session: Not on file  Stress:   . Feeling of Stress : Not on file  Social Connections:   . Frequency of Communication with Friends and Family: Not on file  .  Frequency of Social Gatherings with Friends and Family: Not on file  . Attends Religious Services: Not on file  . Active Member of Clubs or Organizations: Not on file  . Attends Banker Meetings: Not on file  . Marital Status: Not on file  Intimate Partner Violence:   . Fear of Current or Ex-Partner: Not on file  . Emotionally Abused: Not on file  . Physically Abused: Not on file  . Sexually Abused: Not on file    Past Surgical History:  Procedure Laterality Date  . CESAREAN SECTION      Family History  Problem Relation Age of Onset  . Diabetes Mother   . Hypertension Mother   . Hypothyroidism Mother   . Diabetes Father   . Kidney disease Maternal Grandmother   . Leukemia Maternal Grandfather   . Diabetes Paternal Grandmother   . Breast cancer Maternal Aunt 43       has contact    No Known Allergies  Current Outpatient Medications on File Prior to Visit  Medication Sig Dispense Refill  . glucose blood test strip Use as instructed 100 each 12  . Insulin Glargine (BASAGLAR KWIKPEN) 100 UNIT/ML INJECT 24 UNITS UNDER THE SKIN 2 TIMES DAILY 15 mL 0  . levothyroxine (SYNTHROID) 50 MCG tablet Take 1 tablet by mouth every morning on an empty stomach with water only.  No food or other medications for 30 minutes. 90 tablet 0  . metFORMIN (GLUCOPHAGE-XR) 500 MG 24 hr tablet Take 2 tablets (1,000 mg total) by mouth 2 (two) times daily. NEEDS OFFICE VISIT 360 tablet 0   No current facility-administered medications on file prior to visit.    BP 122/78   Pulse 81   Temp (!) 96.1 F (35.6 C) (Temporal)   Ht 5\' 2"  (1.575 m)   Wt 269 lb (122 kg)   SpO2 98%   BMI 49.20 kg/m    Objective:   Physical Exam Cardiovascular:     Rate and Rhythm: Normal rate and regular rhythm.  Pulmonary:     Effort: Pulmonary effort is normal.     Breath sounds: Normal breath sounds.  Musculoskeletal:     Cervical back: Neck supple.  Skin:    General: Skin is warm and dry.             Assessment & Plan:

## 2019-12-02 NOTE — Assessment & Plan Note (Addendum)
A1C increased to 8.1 from 7.1 in February 2021.  Currently not compliant with morning dose of Metformin.  Not checking Blood sugar daily at this time.   She is going to work on taking Metformin twice daily, Continue Victoza 1.2mg  daily & Basaglar 24 units BID.   She is going to begin taking daily blood sugars and send via my chart before considering increase in medications.   Referred to Nutrition & Diabetic services.  Follow up in 3 months.   Agree with assessment and plan. Doreene Nest, NP

## 2019-12-02 NOTE — Progress Notes (Signed)
   Subjective:    Patient ID: Katie [name], female    DOB: 1991-01-21, 29 y.o.   MRN: [id number]  HPI   This visit occurred during the SARS-CoV-2 public health emergency.  Safety protocols were in place, including screening questions prior to the visit, additional usage of staff PPE, and extensive cleaning of exam room while observing appropriate contact time as indicated for disinfecting solutions.   Katie [name] is a 29 year old female with a history of type 2 diabetes, hypothyroidism & morbid obesity who presents today for up of diabetes.   Type 2 diabetes medications include: Metformin XR 1000mg  BID, Victoza 0.2 ml daily & Basaglar 24 units BID.   She is forgetting to take her morning metformin dose 2-3 times weekly. She is complaint to other regimen.   Currently is not checking her blood sugars daily. Takes blood sugar once a week and is a fasting AM: normally in 140's.  She is not eating regular meals, has a very poor diet right now. She has a very busy schedule working at a and is often eating from a vending machine.    Exercise: No current exercise plan, stopped doing dance exercise classes this summer.    Lab Results  Component Value Date   HGBA1C 7.1 (A) 05/05/2019   Lab Results  Component Value Date   HGBA1C 8.1 (A) 12/02/2019   Last Eye Exam: UTD August 2021 Last Foot Exam: UTD. Today  Pneumonia Vaccination: UTD, 2020 Ace/ARB: None  Statin: None  BP Readings from Last 3 Encounters:  12/02/19 122/78  07/30/19 122/78  06/09/19 130/76     Review of Systems  Respiratory: Negative.   Cardiovascular: Negative.   Endocrine: Negative.   Skin: Negative.   Neurological: Negative.   Psychiatric/Behavioral: Negative.        Objective:   Physical Exam Constitutional:      Appearance: Normal appearance.  Cardiovascular:     Pulses: Normal pulses.  Pulmonary:     Effort: Pulmonary effort is normal.     Breath sounds: Normal breath sounds.    Skin:    General: Skin is warm and dry.     Capillary Refill: Capillary refill takes less than 2 seconds.  Neurological:     General: No focal deficit present.     Mental Status: She is alert and oriented to person, place, and time.  Psychiatric:        Mood and Affect: Mood normal.        Behavior: Behavior normal.           Assessment & Plan:

## 2019-12-02 NOTE — Patient Instructions (Addendum)
It is important that you improve your diet. Please limit carbohydrates in the form of white bread, rice, pasta, sweets, fast food, fried food, sugary drinks, etc. Increase your consumption of fresh fruits and vegetables, whole grains, lean protein.  Ensure you are consuming 64 ounces of water daily.  Start checking your blood sugar levels.  Appropriate times to check your blood sugar levels are:  -Before any meal (breakfast, lunch, dinner) -Two hours after any meal (breakfast, lunch, dinner) -Bedtime  Record your readings and notify me if you continue to consistently run at or above 200. Send me a message via Mychart with your readings please.    You will be contacted regarding your referral to Nutrition & Diabetic Services. Please let us know if you have not been contacted within two weeks.     Diabetes Mellitus and Nutrition, Adult When you have diabetes (diabetes mellitus), it is very important to have healthy eating habits because your blood sugar (glucose) levels are greatly affected by what you eat and drink. Eating healthy foods in the appropriate amounts, at about the same times every day, can help you:  Control your blood glucose.  Lower your risk of heart disease.  Improve your blood pressure.  Reach or maintain a healthy weight. Every person with diabetes is different, and each person has different needs for a meal plan. Your health care provider may recommend that you work with a diet and nutrition specialist (dietitian) to make a meal plan that is best for you. Your meal plan may vary depending on factors such as:  The calories you need.  The medicines you take.  Your weight.  Your blood glucose, blood pressure, and cholesterol levels.  Your activity level.  Other health conditions you have, such as heart or kidney disease. How do carbohydrates affect me? Carbohydrates, also called carbs, affect your blood glucose level more than any other type of food. Eating  carbs naturally raises the amount of glucose in your blood. Carb counting is a method for keeping track of how many carbs you eat. Counting carbs is important to keep your blood glucose at a healthy level, especially if you use insulin or take certain oral diabetes medicines. It is important to know how many carbs you can safely have in each meal. This is different for every person. Your dietitian can help you calculate how many carbs you should have at each meal and for each snack. Foods that contain carbs include:  Bread, cereal, rice, pasta, and crackers.  Potatoes and corn.  Peas, beans, and lentils.  Milk and yogurt.  Fruit and juice.  Desserts, such as cakes, cookies, ice cream, and candy. How does alcohol affect me? Alcohol can cause a sudden decrease in blood glucose (hypoglycemia), especially if you use insulin or take certain oral diabetes medicines. Hypoglycemia can be a life-threatening condition. Symptoms of hypoglycemia (sleepiness, dizziness, and confusion) are similar to symptoms of having too much alcohol. If your health care provider says that alcohol is safe for you, follow these guidelines:  Limit alcohol intake to no more than 1 drink per day for nonpregnant women and 2 drinks per day for men. One drink equals 12 oz of beer, 5 oz of wine, or 1 oz of hard liquor.  Do not drink on an empty stomach.  Keep yourself hydrated with water, diet soda, or unsweetened iced tea.  Keep in mind that regular soda, juice, and other mixers may contain a lot of sugar and must be counted  as carbs. What are tips for following this plan?  Reading food labels  Start by checking the serving size on the "Nutrition Facts" label of packaged foods and drinks. The amount of calories, carbs, fats, and other nutrients listed on the label is based on one serving of the item. Many items contain more than one serving per package.  Check the total grams (g) of carbs in one serving. You can  calculate the number of servings of carbs in one serving by dividing the total carbs by 15. For example, if a food has 30 g of total carbs, it would be equal to 2 servings of carbs.  Check the number of grams (g) of saturated and trans fats in one serving. Choose foods that have low or no amount of these fats.  Check the number of milligrams (mg) of salt (sodium) in one serving. Most people should limit total sodium intake to less than 2,300 mg per day.  Always check the nutrition information of foods labeled as "low-fat" or "nonfat". These foods may be higher in added sugar or refined carbs and should be avoided.  Talk to your dietitian to identify your daily goals for nutrients listed on the label. Shopping  Avoid buying canned, premade, or processed foods. These foods tend to be high in fat, sodium, and added sugar.  Shop around the outside edge of the grocery store. This includes fresh fruits and vegetables, bulk grains, fresh meats, and fresh dairy. Cooking  Use low-heat cooking methods, such as baking, instead of high-heat cooking methods like deep frying.  Cook using healthy oils, such as olive, canola, or sunflower oil.  Avoid cooking with butter, cream, or high-fat meats. Meal planning  Eat meals and snacks regularly, preferably at the same times every day. Avoid going long periods of time without eating.  Eat foods high in fiber, such as fresh fruits, vegetables, beans, and whole grains. Talk to your dietitian about how many servings of carbs you can eat at each meal.  Eat 4-6 ounces (oz) of lean protein each day, such as lean meat, chicken, fish, eggs, or tofu. One oz of lean protein is equal to: ? 1 oz of meat, chicken, or fish. ? 1 egg. ?  cup of tofu.  Eat some foods each day that contain healthy fats, such as avocado, nuts, seeds, and fish. Lifestyle  Check your blood glucose regularly.  Exercise regularly as told by your health care provider. This may  include: ? 150 minutes of moderate-intensity or vigorous-intensity exercise each week. This could be brisk walking, biking, or water aerobics. ? Stretching and doing strength exercises, such as yoga or weightlifting, at least 2 times a week.  Take medicines as told by your health care provider.  Do not use any products that contain nicotine or tobacco, such as cigarettes and e-cigarettes. If you need help quitting, ask your health care provider.  Work with a Veterinary surgeon or diabetes educator to identify strategies to manage stress and any emotional and social challenges. Questions to ask a health care provider  Do I need to meet with a diabetes educator?  Do I need to meet with a dietitian?  What number can I call if I have questions?  When are the best times to check my blood glucose? Where to find more information:  American Diabetes Association: diabetes.org  Academy of Nutrition and Dietetics: www.eatright.[employer] of Diabetes and Digestive and Kidney Diseases (NIH): CarFlippers.tn Summary  A healthy meal plan will  help you control your blood glucose and maintain a healthy lifestyle.  Working with a diet and nutrition specialist (dietitian) can help you make a meal plan that is best for you.  Keep in mind that carbohydrates (carbs) and alcohol have immediate effects on your blood glucose levels. It is important to count carbs and to use alcohol carefully. This information is not intended to replace advice given to you by your health care provider. Make sure you discuss any questions you have with your health care provider. Document Revised: 02/09/2017 Document Reviewed: 04/03/2016 Elsevier Patient Education  2020 [employer].

## 2019-12-03 ENCOUNTER — Other Ambulatory Visit: Payer: Self-pay | Admitting: Primary Care

## 2019-12-03 DIAGNOSIS — O99280 Endocrine, nutritional and metabolic diseases complicating pregnancy, unspecified trimester: Secondary | ICD-10-CM

## 2019-12-03 LAB — HEPATITIS C ANTIBODY
Hepatitis C Ab: NONREACTIVE
SIGNAL TO CUT-OFF: 0.02 (ref <1.00)

## 2019-12-03 MED ORDER — LEVOTHYROXINE SODIUM 50 MCG PO TABS: ORAL_TABLET | ORAL | 3 refills | Status: DC

## 2019-12-12 ENCOUNTER — Other Ambulatory Visit: Payer: Self-pay | Admitting: Primary Care

## 2019-12-12 DIAGNOSIS — Z794 Long term (current) use of insulin: Secondary | ICD-10-CM

## 2020-02-24 DIAGNOSIS — N898 Other specified noninflammatory disorders of vagina: Secondary | ICD-10-CM

## 2020-02-25 MED ORDER — FLUCONAZOLE 150 MG PO TABS: 150.0000 mg | ORAL_TABLET | Freq: Once | ORAL | 0 refills | Status: AC

## 2020-02-29 ENCOUNTER — Other Ambulatory Visit: Payer: Self-pay | Admitting: Primary Care

## 2020-02-29 ENCOUNTER — Other Ambulatory Visit: Payer: Self-pay | Admitting: Obstetrics and Gynecology

## 2020-02-29 DIAGNOSIS — Z794 Long term (current) use of insulin: Secondary | ICD-10-CM

## 2020-03-02 ENCOUNTER — Encounter: Payer: Self-pay | Admitting: Primary Care

## 2020-03-02 ENCOUNTER — Other Ambulatory Visit: Payer: Self-pay

## 2020-03-02 ENCOUNTER — Ambulatory Visit (INDEPENDENT_AMBULATORY_CARE_PROVIDER_SITE_OTHER): Payer: BC Managed Care – PPO | Admitting: Primary Care

## 2020-03-02 VITALS — BP 124/78 | HR 88 | Temp 97.3°F | Ht 62.0 in | Wt 267.0 lb

## 2020-03-02 DIAGNOSIS — Z794 Long term (current) use of insulin: Secondary | ICD-10-CM

## 2020-03-02 DIAGNOSIS — E119 Type 2 diabetes mellitus without complications: Secondary | ICD-10-CM

## 2020-03-02 DIAGNOSIS — R300 Dysuria: Secondary | ICD-10-CM | POA: Diagnosis not present

## 2020-03-02 DIAGNOSIS — H9203 Otalgia, bilateral: Secondary | ICD-10-CM

## 2020-03-02 DIAGNOSIS — N898 Other specified noninflammatory disorders of vagina: Secondary | ICD-10-CM | POA: Diagnosis not present

## 2020-03-02 HISTORY — DX: Otalgia, bilateral: H92.03

## 2020-03-02 LAB — POC URINALSYSI DIPSTICK (AUTOMATED)
Bilirubin, UA: NEGATIVE
Blood, UA: NEGATIVE
Glucose, UA: NEGATIVE
Leukocytes, UA: NEGATIVE
Nitrite, UA: NEGATIVE
Protein, UA: NEGATIVE
Spec Grav, UA: 1.025 (ref 1.010–1.025)
Urobilinogen, UA: 0.2 E.U./dL
pH, UA: 5 (ref 5.0–8.0)

## 2020-03-02 LAB — POCT GLYCOSYLATED HEMOGLOBIN (HGB A1C): Hemoglobin A1C: 8 % — AB (ref 4.0–5.6)

## 2020-03-02 NOTE — Assessment & Plan Note (Signed)
Uncontrolled, would like to see A1C <7, discussed this with patient today. Also discussed the risks of uncontrolled diabetes over time, damage to organs, etc. I stressed the absolute need to check glucose readings at least BID.  I recommended we increase Victoza, she declines and would like to work on compliance to Metformin.   Continue Victoza 1.2 mg daily. Continue Basaglar 24 units BID. Improve compliance to Metformin XR 1000 mg BID.  She will work on weight loss through diet and exercise. Follow up in 3 months.

## 2020-03-02 NOTE — Progress Notes (Signed)
Subjective:    Patient ID: Katie [name], female    DOB: 1990-08-08, 29 y.o.   MRN: [id number]  HPI  This visit occurred during the SARS-CoV-2 public health emergency.  Safety protocols were in place, including screening questions prior to the visit, additional usage of staff PPE, and extensive cleaning of exam room while observing appropriate contact time as indicated for disinfecting solutions.   Katie [name] is a 29 year old female with a history of hypothyroidism, type 2 diabetes who presents today for follow up of diabetes and request for ENT evaluation.  1) Type 2 Diabetes: Current medications include: Basaglar 24 units BID, Victoza 1.2 mg daily, Metformin XR 1000 mg BID.   She's forgetting to take Metformin for the most part, will sometimes take one tablet once daily. She plans on improving compliance.   She is checking her blood glucose infrequently, but has been more consistent over the last month and is getting readings mostly in the mid to high 100's, some low 200's. Most of her readings are fasting, some are 2 hours after lunch.   Last A1C: 8.1 in September 2021 Last Eye Exam: UTD Last Foot Exam: UTD Pneumonia Vaccination: UTD ACE/ARB: None. Urine micro negative in September 2021 Statin: None.  BP Readings from Last 3 Encounters:  03/02/20 124/78  12/02/19 122/78  07/30/19 122/78   She admits to little motivation to control her diabetes over the last several months, has been under stress with her husband, now separated. She is following with her therapist regularly, is now seeing therapy every other week. She is working on getting her diet back in control, is exercising 2 -3 times weekly.   2) Otalgia: Occurring only with pressure/altitude changes. She is requesting a referral to ENT due to severe pain to bilateral ears with altitude and pressure changes. She hasn't flown in an airplane in 10 years as her last encounter caused significant pressure and pain to her ears  during the entire trip. She would like to fly again, but would like an ENT evaluation prior to traveling.   3) Vaginal Itching: Acute for the last several weeks. History of vaginal yeast infections when glucose readings are uncontrolled. She denies vaginal discharge. She does experience dysuria.   Review of Systems  HENT:       Ear pain with altitude changes  Eyes: Negative for visual disturbance.  Respiratory: Negative for shortness of breath.   Cardiovascular: Negative for chest pain.  Genitourinary: Positive for dysuria. Negative for frequency and vaginal discharge.       Vaginal itching  Neurological: Negative for numbness.       Past Medical History:  Diagnosis Date   Diabetes mellitus without complication (HCC)    Family history of breast cancer    9/20 cancer genetic testing letter sent   History of cesarean section complicating pregnancy 11/15/2018   History of preterm delivery 12/05/2018   Recommend serial cervical length screening starting at 16 weeks Refer for possible cerclage if cervical length < 2.5 cm   Supervision of high risk pregnancy in first trimester 11/15/2018   [facility] Prenatal Labs Dating  Blood type: B/Positive/-- (09/04 1216)  Genetic Screen 1 Screen:    AFP:     Quad:     NIPS: Antibody:Negative (09/04 1216) Anatomic [place]  Rubella: 5.05 (09/04 1216) Varicella: @VZVIGG @ GTT Early:               Third trimester:  RPR: Non Reactive (09/04 1216)  Rhogam  HBsAg: Negative (09/04 1216)  TDaP vaccine                       Flu Shot: HIV: Non Reactive (   Thyroid disease    Type 2 diabetes mellitus with hyperosmolarity without coma, with long-term current use of insulin (HCC) 08/03/2017     Social History   Socioeconomic History   Marital status: Married    Spouse name: Ryheam Research scientist (life sciences) of children: Not on file   Years of education: Not on file   Highest education level: Not on file  Occupational History   Occupation: Nurse, learning disability   Tobacco Use   Smoking status: Never Smoker   Smokeless tobacco: Never Used  Building services engineer Use: Never used  Substance and Sexual Activity   Alcohol use: No   Drug use: No   Sexual activity: Yes    Partners: Male    Birth control/protection: None  Other Topics Concern   Not on file  Social History Narrative   Works as a Runner, 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   Enjoys hiking, spending time with family, concerts   Social Determinants of Health   Financial Resource Strain: Not on file  Food Insecurity: Not on file  Transportation Needs: Not on file  Physical Activity: Not on file  Stress: Not on file  Social Connections: Not on file  Intimate Partner Violence: Not on file    Past Surgical History:  Procedure Laterality Date   CESAREAN SECTION      Family History  Problem Relation Age of Onset   Diabetes Mother    Hypertension Mother    Hypothyroidism Mother    Diabetes Father    Kidney disease Maternal Grandmother    Leukemia Maternal Grandfather    Diabetes Paternal Grandmother    Breast cancer Maternal Aunt 33       has contact    No Known Allergies  Current Outpatient Medications on File Prior to Visit  Medication Sig Dispense Refill   glucose blood test strip Use as instructed 100 each 12   Insulin Glargine (BASAGLAR KWIKPEN) 100 UNIT/ML INJECT 24 UNITS UNDER THE SKIN 2 TIMES DAILY 15 mL 1   levothyroxine (SYNTHROID) 50 MCG tablet Take 1 tablet by mouth every morning on an empty stomach with water only.  No food or other medications for 30 minutes. 90 tablet 3   liraglutide (VICTOZA) 18 MG/3ML SOPN Inject 1.2 mg into the skin daily. for diabetes. 6 mL 5   metFORMIN (GLUCOPHAGE-XR) 500 MG 24 hr tablet Take 2 tablets (1,000 mg total) by mouth 2 (two) times daily. NEEDS OFFICE VISIT 360 tablet 0   No current facility-administered medications on file prior to visit.    BP 124/78    Pulse 88    Temp (!) 97.3 F (36.3 C)  (Temporal)    Ht 5\' 2"  (1.575 m)    Wt 267 lb (121.1 kg)    SpO2 98%    BMI 48.83 kg/m    Objective:   Physical Exam Constitutional:      Appearance: She is well-nourished.  HENT:     Right Ear: Tympanic membrane and ear canal normal.     Left Ear: Tympanic membrane and ear canal normal.  Cardiovascular:     Rate and Rhythm: Normal rate and regular rhythm.  Pulmonary:     Effort: Pulmonary effort is normal.     Breath sounds:  Normal breath sounds.  Genitourinary:    Labia:        Right: No tenderness or lesion.        Left: No tenderness or lesion.      Vagina: Vaginal discharge present. No erythema or bleeding.     Cervix: Discharge present. No cervical motion tenderness or cervical bleeding.     Comments: Scant amount of green/brown discharge, no foul smell. Musculoskeletal:     Cervical back: Neck supple.  Skin:    General: Skin is warm and dry.  Psychiatric:        Mood and Affect: Mood and affect normal.            Assessment & Plan:

## 2020-03-02 NOTE — Patient Instructions (Signed)
Take your Metformin everyday, twice daily as discussed.  Continue Basaglar 24 units twice daily. Continue Victoza daily.  Continue exercising. You should be getting 150 minutes of moderate intensity exercise weekly.  Continue to work on a healthy diet. Ensure you are consuming 64 ounces of water daily.  Please schedule a follow up appointment in 3 months.  It was a pleasure to see you today!

## 2020-03-02 NOTE — Assessment & Plan Note (Signed)
Acute for the last several weeks, history of vaginal yeast infections.  Wet prep completed today, waiting results.  UA today completed.

## 2020-03-02 NOTE — Assessment & Plan Note (Signed)
Occurring with pressure/altitude changes only. Exam today unremarkable.  Referral placed to ENT per patient request.

## 2020-03-03 LAB — WET PREP BY MOLECULAR PROBE
Candida species: NOT DETECTED
Gardnerella vaginalis: NOT DETECTED
MICRO NUMBER:: 11343364
SPECIMEN QUALITY:: ADEQUATE
Trichomonas vaginosis: NOT DETECTED

## 2020-03-16 NOTE — Telephone Encounter (Signed)
FYI,

## 2020-03-19 ENCOUNTER — Ambulatory Visit: Payer: BC Managed Care – PPO | Admitting: Primary Care

## 2020-03-23 ENCOUNTER — Ambulatory Visit: Payer: Self-pay | Admitting: Primary Care

## 2020-04-09 ENCOUNTER — Ambulatory Visit: Payer: Self-pay | Admitting: Primary Care

## 2020-04-13 ENCOUNTER — Other Ambulatory Visit: Payer: Self-pay

## 2020-04-13 ENCOUNTER — Ambulatory Visit (INDEPENDENT_AMBULATORY_CARE_PROVIDER_SITE_OTHER): Payer: BC Managed Care – PPO | Admitting: Primary Care

## 2020-04-13 ENCOUNTER — Encounter: Payer: Self-pay | Admitting: Primary Care

## 2020-04-13 VITALS — BP 126/82 | HR 83 | Temp 97.8°F | Ht 62.0 in | Wt 263.4 lb

## 2020-04-13 DIAGNOSIS — Z113 Encounter for screening for infections with a predominantly sexual mode of transmission: Secondary | ICD-10-CM

## 2020-04-13 NOTE — Assessment & Plan Note (Signed)
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 Wants all labs repeated today.   Given that she's never had a HSV outbreak, I do not recommend suppressive treatment.  We discussed treatment if she were to have an outbreak, she will call.   She is asymptomatic.  Has been sexually active.  Await labs.

## 2020-04-13 NOTE — Progress Notes (Signed)
Subjective:    Patient ID: Katie [name], female    DOB: 1990/04/16, 30 y.o.   MRN: [id number]  HPI  This visit occurred during the SARS-CoV-2 public health emergency.  Safety protocols were in place, including screening questions prior to the visit, additional usage of staff PPE, and extensive cleaning of exam room while observing appropriate contact time as indicated for disinfecting solutions.   Katie [name] is a 30 year old female with a history of type 2 diabetes, vaginal yeast infections, hypothyroidism who presents today for STD testing.  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 She has never been told that she has HSV. She also denies a history of oral sores. She is questioning whether she should be on suppressive treatment.   She would also like repeat STD testing (all labs) to ensure that these labs were not falsely positive. She tested negative three weeks ago for HIV, Hep C, syphilis, gonorrhea, chlamydia.  She has no vaginal symptoms today.   BP Readings from Last 3 Encounters:  04/13/20 126/82  03/02/20 124/78  12/02/19 122/78     Review of Systems  HENT:       Denies oral sores  Gastrointestinal: Negative for abdominal pain.  Genitourinary: Negative for frequency, pelvic pain, vaginal bleeding and vaginal discharge.       Past Medical History:  Diagnosis Date  . Diabetes mellitus without complication (HCC)   . Family history of breast cancer    9/20 cancer genetic testing letter sent  . History of cesarean section complicating pregnancy 11/15/2018  . History of preterm delivery 12/05/2018   Recommend serial cervical length screening starting at 16 weeks Refer for possible cerclage if cervical length < 2.5 cm  . Supervision of high risk pregnancy in first trimester 11/15/2018   [facility]inic [facility] Prenatal Labs Dating  Blood type: B/Positive/-- (09/04 1216)  Genetic  Screen 1 Screen:    AFP:     Quad:     NIPS: Antibody:Negative (09/04 1216) Anatomic [place]  Rubella: 5.05 (09/04 1216) Varicella: @VZVIGG @ GTT Early:               Third trimester:  RPR: Non Reactive (09/04 1216)  Rhogam  HBsAg: Negative (09/04 1216)  TDaP vaccine                       Flu Shot: HIV: Non Reactive (  . Thyroid disease   . Type 2 diabetes mellitus with hyperosmolarity without coma, with long-term current use of insulin (HCC) 08/03/2017     Social History   Socioeconomic History  . Marital status: Married    Spouse name: Ryheam 08/05/2017  . Number of children: Not on file  . Years of education: Not on file  . Highest education level: Not on file  Occupational History  . Occupation: EC Teacher  Tobacco Use  . Smoking status: Never Smoker  . Smokeless tobacco: Never Used  Vaping Use  . Vaping Use: Never used  Substance and Sexual Activity  . Alcohol use: No  . Drug use: No  . Sexual activity: Yes    Partners: Male    Birth control/protection: None  Other Topics Concern  . Not on file  Social History Narrative   Works as a Sales promotion account executive for [employer], broadcasting/film/video.   2 children.   Enjoys hiking, spending time with family, concerts   Social  Determinants of Health   Financial Resource Strain: Not on file  Food Insecurity: Not on file  Transportation Needs: Not on file  Physical Activity: Not on file  Stress: Not on file  Social Connections: Not on file  Intimate Partner Violence: Not on file    Past Surgical History:  Procedure Laterality Date  . CESAREAN SECTION      Family History  Problem Relation Age of Onset  . Diabetes Mother   . Hypertension Mother   . Hypothyroidism Mother   . Diabetes Father   . Kidney disease Maternal Grandmother   . Leukemia Maternal Grandfather   . Diabetes Paternal Grandmother   . Breast cancer Maternal Aunt 43       has contact    No Known Allergies  Current Outpatient Medications on File Prior to Visit  Medication Sig  Dispense Refill  . glucose blood test strip Use as instructed 100 each 12  . Insulin Glargine (BASAGLAR KWIKPEN) 100 UNIT/ML INJECT 24 UNITS UNDER THE SKIN 2 TIMES DAILY 15 mL 1  . levothyroxine (SYNTHROID) 50 MCG tablet Take 1 tablet by mouth every morning on an empty stomach with water only.  No food or other medications for 30 minutes. 90 tablet 3  . liraglutide (VICTOZA) 18 MG/3ML SOPN Inject 1.2 mg into the skin daily. for diabetes. 6 mL 5  . metFORMIN (GLUCOPHAGE-XR) 500 MG 24 hr tablet Take 2 tablets (1,000 mg total) by mouth 2 (two) times daily. NEEDS OFFICE VISIT 360 tablet 0   No current facility-administered medications on file prior to visit.    BP 126/82   Pulse 83   Temp 97.8 F (36.6 C) (Temporal)   Ht 5\' 2"  (1.575 m)   Wt 263 lb 6.4 oz (119.5 kg)   SpO2 98%   BMI 48.18 kg/m    Objective:   Physical Exam Pulmonary:     Effort: Pulmonary effort is normal.  Neurological:     Mental Status: She is alert.  Psychiatric:     Comments: Appears anxious regarding HSV diagnosis            Assessment & Plan:

## 2020-04-13 NOTE — Patient Instructions (Signed)
Stop by the lab prior to leaving today. I will notify you of your results once received.   It was a pleasure to see you today!   Cold Sore  A cold sore, also called a fever blister, is a small, fluid-filled sore that forms inside of the mouth or on the lips, gums, nose, chin, or cheeks. Cold sores can spread to other parts of the body, such as the eyes or fingers. Cold sores can spread from person to person (are contagious) until the sores crust over completely. Most cold sores go away within 2 weeks. What are the causes? Cold sores are caused by a virus (herpes simplex virus type 1, HSV-1). The virus can spread from person to person through close contact, such as through:  Kissing.  Touching the affected area.  Sharing personal items such as lip balm, razors, a drinking glass, or eating utensils. What increases the risk? You are more likely to develop this condition if you:  Are tired, stressed, or sick.  Are having your period (menstruating).  Are pregnant.  Take certain medicines.  Are out in cold weather or get too much sun. What are the signs or symptoms? Symptoms of a cold sore outbreak go through different stages. These are the stages of a cold sore:  Tingling, itching, or burning is felt 1-2 days before the outbreak.  Fluid-filled blisters appear on the lips, inside the mouth, on the nose, or on the cheeks.  The blisters start to ooze clear fluid.  The blisters dry up, and a yellow crust appears in their place.  The crust falls off. In some cases, other symptoms can develop during a cold sore outbreak. These can include:  Fever.  Sore throat.  Headache.  Muscle aches.  Swollen neck glands. How is this treated? There is no cure for cold sores or the virus that causes them. There is also no vaccine to prevent the virus. Most cold sores go away on their own without treatment within 2 weeks. Your doctor may prescribe medicines to:  Help with pain.  Keep  the virus from growing.  Help you heal faster. Medicines may be in the form of creams, gels, pills, or a shot. Follow these instructions at home: Medicines  Take or apply over-the-counter and prescription medicines only as told by your doctor.  Use a cotton-tip swab to apply creams or gels to your sores.  Ask your doctor if you can take lysine supplements. These may help with healing. Sore care  Do not touch the sores or pick the scabs.  Wash your hands often. Do not touch your eyes without washing your hands first.  Keep the sores clean and dry.  If told, put ice on the sores: ? Put ice in a plastic bag. ? Place a towel between your skin and the bag. ? Leave the ice on for 20 minutes, 2-3 times a day.   Eating and drinking  Eat a soft, bland diet. Avoid eating hot, cold, or salty foods. These can hurt your mouth.  Use a straw if it hurts to drink out of a glass.  Eat foods that have a lot of lysine in them. These include meat, fish, and dairy products.  Avoid sugary foods, chocolates, nuts, and grains. These foods have a high amount of a substance (arginine) that can cause the virus to grow. Lifestyle  Do not kiss, have oral sex, or share personal items until your sores heal.  Stress, poor sleep, and being out  in the sun can trigger outbreaks. Make sure you: ? Do activities that help you relax, such as deep breathing exercises or meditation. ? Get enough sleep. ? Apply sunscreen on your lips before you go out in the sun. Contact a doctor if:  You have symptoms for more than 2 weeks.  You have pus coming from the sores.  You have redness that is spreading.  You have pain or irritation in your eye.  You get sores on your genitals.  Your sores do not heal within 2 weeks.  You get cold sores often. Get help right away if:  You have a fever and your symptoms suddenly get worse.  You have a headache and confusion.  You have tiredness (fatigue).  You do not  want to eat as much as normal (loss of appetite).  You have a stiff neck or are sensitive to light. Summary  A cold sore is a small, fluid-filled sore that forms inside of the mouth or on the lips, gums, nose, chin, or cheeks.  Cold sores can spread from person to person (are contagious) until the sores crust over completely. Most cold sores go away within 2 weeks.  Wash your hands often. Do not touch your eyes without washing your hands first.  Do not kiss, have oral sex, or share personal items until your sores heal.  Contact a doctor if your sores do not heal within 2 weeks. This information is not intended to replace advice given to you by your health care provider. Make sure you discuss any questions you have with your health care provider. Document Revised: 06/19/2018 Document Reviewed: 07/30/2017 Elsevier Patient Education  2021 [employer].

## 2020-04-16 LAB — HEPATITIS C ANTIBODY
Hepatitis C Ab: NONREACTIVE
SIGNAL TO CUT-OFF: 0.04 (ref <1.00)

## 2020-04-16 LAB — WET PREP BY MOLECULAR PROBE
Candida species: NOT DETECTED
Gardnerella vaginalis: NOT DETECTED
MICRO NUMBER:: 11481453
SPECIMEN QUALITY:: ADEQUATE
Trichomonas vaginosis: NOT DETECTED

## 2020-04-16 LAB — HSV 1/2 AB (IGM), IFA W/RFLX TITER
HSV 1 IgM Screen: NEGATIVE
HSV 2 IgM Screen: NEGATIVE

## 2020-04-16 LAB — C. TRACHOMATIS/N. GONORRHOEAE RNA
C. trachomatis RNA, TMA: NOT DETECTED
N. gonorrhoeae RNA, TMA: NOT DETECTED

## 2020-04-16 LAB — RPR: RPR Ser Ql: NONREACTIVE

## 2020-04-16 LAB — HSV(HERPES SIMPLEX VRS) I + II AB-IGG
HAV 1 IGG,TYPE SPECIFIC AB: 11.9 index — ABNORMAL HIGH
HSV 2 IGG,TYPE SPECIFIC AB: <0.9 index

## 2020-04-16 LAB — HIV ANTIBODY (ROUTINE TESTING W REFLEX): HIV 1&2 Ab, 4th Generation: NONREACTIVE

## 2020-05-24 ENCOUNTER — Other Ambulatory Visit: Payer: Self-pay | Admitting: Primary Care

## 2020-05-24 DIAGNOSIS — Z794 Long term (current) use of insulin: Secondary | ICD-10-CM

## 2020-05-24 DIAGNOSIS — E1100 Type 2 diabetes mellitus with hyperosmolarity without nonketotic hyperglycemic-hyperosmolar coma (NKHHC): Secondary | ICD-10-CM

## 2020-05-31 ENCOUNTER — Ambulatory Visit: Payer: BC Managed Care – PPO | Admitting: Primary Care

## 2020-06-01 ENCOUNTER — Ambulatory Visit: Payer: BC Managed Care – PPO | Admitting: Primary Care

## 2020-06-01 NOTE — Telephone Encounter (Signed)
Can you get her connected with a Dexcom or Freestyle Libre sample?

## 2020-06-02 NOTE — Telephone Encounter (Signed)
Did you get connected with her?

## 2020-06-24 ENCOUNTER — Ambulatory Visit: Payer: BC Managed Care – PPO | Admitting: Primary Care

## 2020-06-28 ENCOUNTER — Other Ambulatory Visit: Payer: Self-pay | Admitting: Primary Care

## 2020-06-28 DIAGNOSIS — Z794 Long term (current) use of insulin: Secondary | ICD-10-CM

## 2020-06-28 DIAGNOSIS — E119 Type 2 diabetes mellitus without complications: Secondary | ICD-10-CM

## 2020-07-23 ENCOUNTER — Ambulatory Visit: Payer: BC Managed Care – PPO | Admitting: Primary Care

## 2020-07-30 ENCOUNTER — Other Ambulatory Visit: Payer: Self-pay | Admitting: Primary Care

## 2020-07-30 DIAGNOSIS — Z794 Long term (current) use of insulin: Secondary | ICD-10-CM

## 2020-07-30 DIAGNOSIS — E1100 Type 2 diabetes mellitus with hyperosmolarity without nonketotic hyperglycemic-hyperosmolar coma (NKHHC): Secondary | ICD-10-CM

## 2020-07-30 NOTE — Telephone Encounter (Signed)
Patient is overdue for diabetes follow up and needs to be scheduled ASAP. I believe she missed her appointment that was scheduled for this week. Also, please verify how much Basaglar she is injecting currently.  Thanks.

## 2020-07-30 NOTE — Telephone Encounter (Signed)
Mail box full not able to leave message.  °

## 2020-08-01 ENCOUNTER — Other Ambulatory Visit: Payer: Self-pay | Admitting: Primary Care

## 2020-08-01 DIAGNOSIS — E119 Type 2 diabetes mellitus without complications: Secondary | ICD-10-CM

## 2020-08-02 NOTE — Telephone Encounter (Signed)
Called patient appointment made for follow up. She is taking Basaglar 24 u in morning and 24U at night.

## 2020-08-03 NOTE — Telephone Encounter (Signed)
Noted, Refill(s) sent to pharmacy.  

## 2020-08-18 ENCOUNTER — Ambulatory Visit: Payer: BC Managed Care – PPO | Admitting: Primary Care

## 2020-08-18 ENCOUNTER — Other Ambulatory Visit: Payer: Self-pay

## 2020-08-18 ENCOUNTER — Encounter: Payer: Self-pay | Admitting: Primary Care

## 2020-08-18 VITALS — BP 124/76 | HR 97 | Temp 98.4°F | Ht 62.0 in | Wt 264.0 lb

## 2020-08-18 DIAGNOSIS — Z794 Long term (current) use of insulin: Secondary | ICD-10-CM | POA: Diagnosis not present

## 2020-08-18 DIAGNOSIS — E119 Type 2 diabetes mellitus without complications: Secondary | ICD-10-CM | POA: Diagnosis not present

## 2020-08-18 LAB — POCT GLYCOSYLATED HEMOGLOBIN (HGB A1C): Hemoglobin A1C: 9.5 % — AB (ref 4.0–5.6)

## 2020-08-18 MED ORDER — METFORMIN HCL ER 500 MG PO TB24: 1000.0000 mg | ORAL_TABLET | Freq: Every day | ORAL | 1 refills | Status: DC

## 2020-08-18 MED ORDER — DEXCOM G6 TRANSMITTER MISC: 0 refills | Status: DC

## 2020-08-18 MED ORDER — GLIPIZIDE ER 10 MG PO TB24: 10.0000 mg | ORAL_TABLET | Freq: Every day | ORAL | 0 refills | Status: DC

## 2020-08-18 MED ORDER — VICTOZA 18 MG/3ML ~~LOC~~ SOPN: PEN_INJECTOR | SUBCUTANEOUS | 2 refills | Status: DC

## 2020-08-18 MED ORDER — DEXCOM G6 SENSOR MISC: 1 refills | Status: DC

## 2020-08-18 NOTE — Assessment & Plan Note (Signed)
Uncontrolled with A1C today of 9.5.  She is not checking glucose levels, discussed the absolute need to do so. We decided to trial Dexcom continuous reader and sensor, sample provided today.  Metformin XR is causing GI upset, reduce down to 1000 mg in AM only.  Continue Basagar 24 units BID. Increase Victoza to 1.8 mg daily Add Glipizide XL 10 mg daily.  She will work on diet and exercise. Follow up in 6 weeks.

## 2020-08-18 NOTE — Patient Instructions (Addendum)
Start checking your blood sugar levels.  Appropriate times to check your blood sugar levels are:  -Before any meal (breakfast, lunch, dinner) -Two hours after any meal (breakfast, lunch, dinner) -Bedtime  We increased the dose of Victoza to 1.8 mg daily.  Reduce metformin to 2 tablets in the morning with breakfast.  Start glipizide XL 10 mg once daily in the morning with breakfast.  Continue Basaglar 24 units twice daily.   Please schedule a follow up visit for 6 weeks for follow up of diabetes.  It was a pleasure to see you today!   Diabetes Mellitus and Nutrition, Adult When you have diabetes, or diabetes mellitus, it is very important to have healthy eating habits because your blood sugar (glucose) levels are greatly affected by what you eat and drink. Eating healthy foods in the right amounts, at about the same times every day, can help you:  Control your blood glucose.  Lower your risk of heart disease.  Improve your blood pressure.  Reach or maintain a healthy weight. What can affect my meal plan? Every person with diabetes is different, and each person has different needs for a meal plan. Your health care provider may recommend that you work with a dietitian to make a meal plan that is best for you. Your meal plan may vary depending on factors such as:  The calories you need.  The medicines you take.  Your weight.  Your blood glucose, blood pressure, and cholesterol levels.  Your activity level.  Other health conditions you have, such as heart or kidney disease. How do carbohydrates affect me? Carbohydrates, also called carbs, affect your blood glucose level more than any other type of food. Eating carbs naturally raises the amount of glucose in your blood. Carb counting is a method for keeping track of how many carbs you eat. Counting carbs is important to keep your blood glucose at a healthy level, especially if you use insulin or take certain oral diabetes  medicines. It is important to know how many carbs you can safely have in each meal. This is different for every person. Your dietitian can help you calculate how many carbs you should have at each meal and for each snack. How does alcohol affect me? Alcohol can cause a sudden decrease in blood glucose (hypoglycemia), especially if you use insulin or take certain oral diabetes medicines. Hypoglycemia can be a life-threatening condition. Symptoms of hypoglycemia, such as sleepiness, dizziness, and confusion, are similar to symptoms of having too much alcohol.  Do not drink alcohol if: ? Your health care provider tells you not to drink. ? You are pregnant, may be pregnant, or are planning to become pregnant.  If you drink alcohol: ? Do not drink on an empty stomach. ? Limit how much you use to:  0-1 drink a day for women.  0-2 drinks a day for men. ? Be aware of how much alcohol is in your drink. In the [place] one drink equals one 12 oz bottle of beer (355 mL), one 5 oz glass of wine (148 mL), or one 1 oz glass of hard liquor (44 mL). ? Keep yourself hydrated with water, diet soda, or unsweetened iced tea.  Keep in mind that regular soda, juice, and other mixers may contain a lot of sugar and must be counted as carbs. What are tips for following this plan? Reading food labels  Start by checking the serving size on the "Nutrition Facts" label of packaged foods and drinks. The  amount of calories, carbs, fats, and other nutrients listed on the label is based on one serving of the item. Many items contain more than one serving per package.  Check the total grams (g) of carbs in one serving. You can calculate the number of servings of carbs in one serving by dividing the total carbs by 15. For example, if a food has 30 g of total carbs per serving, it would be equal to 2 servings of carbs.  Check the number of grams (g) of saturated fats and trans fats in one serving. Choose foods that have a low  amount or none of these fats.  Check the number of milligrams (mg) of salt (sodium) in one serving. Most people should limit total sodium intake to less than 2,300 mg per day.  Always check the nutrition information of foods labeled as "low-fat" or "nonfat." These foods may be higher in added sugar or refined carbs and should be avoided.  Talk to your dietitian to identify your daily goals for nutrients listed on the label. Shopping  Avoid buying canned, pre-made, or processed foods. These foods tend to be high in fat, sodium, and added sugar.  Shop around the outside edge of the grocery store. This is where you will most often find fresh fruits and vegetables, bulk grains, fresh meats, and fresh dairy. Cooking  Use low-heat cooking methods, such as baking, instead of high-heat cooking methods like deep frying.  Cook using healthy oils, such as olive, canola, or sunflower oil.  Avoid cooking with butter, cream, or high-fat meats. Meal planning  Eat meals and snacks regularly, preferably at the same times every day. Avoid going long periods of time without eating.  Eat foods that are high in fiber, such as fresh fruits, vegetables, beans, and whole grains. Talk with your dietitian about how many servings of carbs you can eat at each meal.  Eat 4-6 oz (112-168 g) of lean protein each day, such as lean meat, chicken, fish, eggs, or tofu. One ounce (oz) of lean protein is equal to: ? 1 oz (28 g) of meat, chicken, or fish. ? 1 egg. ?  cup (62 g) of tofu.  Eat some foods each day that contain healthy fats, such as avocado, nuts, seeds, and fish.   What foods should I eat? Fruits Berries. Apples. Oranges. Peaches. Apricots. Plums. Grapes. Mango. Papaya. Pomegranate. Kiwi. Cherries. Vegetables Lettuce. Spinach. Leafy greens, including kale, chard, collard greens, and mustard greens. Beets. Cauliflower. Cabbage. Broccoli. Carrots. Green beans. Tomatoes. Peppers. Onions. Cucumbers. Brussels  sprouts. Grains Whole grains, such as whole-wheat or whole-grain bread, crackers, tortillas, cereal, and pasta. Unsweetened oatmeal. Quinoa. Brown or wild rice. Meats and other proteins Seafood. Poultry without skin. Lean cuts of poultry and beef. Tofu. Nuts. Seeds. Dairy Low-fat or fat-free dairy products such as milk, yogurt, and cheese. The items listed above may not be a complete list of foods and beverages you can eat. Contact a dietitian for more information. What foods should I avoid? Fruits Fruits canned with syrup. Vegetables Canned vegetables. Frozen vegetables with butter or cream sauce. Grains Refined white flour and flour products such as bread, pasta, snack foods, and cereals. Avoid all processed foods. Meats and other proteins Fatty cuts of meat. Poultry with skin. Breaded or fried meats. Processed meat. Avoid saturated fats. Dairy Full-fat yogurt, cheese, or milk. Beverages Sweetened drinks, such as soda or iced tea. The items listed above may not be a complete list of foods and beverages you  should avoid. Contact a dietitian for more information. Questions to ask a health care provider  Do I need to meet with a diabetes educator?  Do I need to meet with a dietitian?  What number can I call if I have questions?  When are the best times to check my blood glucose? Where to find more information:  American Diabetes Association: diabetes.org  Academy of Nutrition and Dietetics: www.eatright.[employer] of Diabetes and Digestive and Kidney Diseases: CarFlippers.tn  Association of Diabetes Care and Education Specialists: www.diabeteseducator.org Summary  It is important to have healthy eating habits because your blood sugar (glucose) levels are greatly affected by what you eat and drink.  A healthy meal plan will help you control your blood glucose and maintain a healthy lifestyle.  Your health care provider may recommend that you work with a  dietitian to make a meal plan that is best for you.  Keep in mind that carbohydrates (carbs) and alcohol have immediate effects on your blood glucose levels. It is important to count carbs and to use alcohol carefully. This information is not intended to replace advice given to you by your health care provider. Make sure you discuss any questions you have with your health care provider. Document Revised: 02/04/2019 Document Reviewed: 02/04/2019 Elsevier Patient Education  2021 [employer].

## 2020-08-18 NOTE — Progress Notes (Signed)
Subjective:    Patient ID: Katie [name], female    DOB: March 28, 1990, 30 y.o.   MRN: [id number]  HPI  Katie [name] is a very pleasant 30 y.o. female with a history of type 2 diabetes, hypothyroidism, obesity who presents today for follow up of diabetes.  Current medications include: Basaglar 24 units BID, Metformin XR 1000 mg BID, Victoza 1.2 mg daily. She does miss the evening dose of her Metformin 3-4 nights weekly due to sour burps, reflux, and forgetting.   She is checking her blood glucose infrequently as she forgets to check.   She endorses a poor diet, is eating a lot of fast/fried food as she's "on the go".   Last A1C:  8.0 in December 2021, 9.5 today.  Last Eye Exam: UTD Last Foot Exam: UTD Pneumonia Vaccination: 2020 ACE/ARB: None, urine microalbumin Statin: None.   BP Readings from Last 3 Encounters:  08/18/20 124/76  04/13/20 126/82  03/02/20 124/78      Review of Systems  Eyes: Negative for visual disturbance.  Respiratory: Negative for shortness of breath.   Cardiovascular: Negative for chest pain.  Neurological: Negative for dizziness and numbness.         Past Medical History:  Diagnosis Date  . Diabetes mellitus without complication (HCC)   . Family history of breast cancer    9/20 cancer genetic testing letter sent  . History of cesarean section complicating pregnancy 11/15/2018  . History of preterm delivery 12/05/2018   Recommend serial cervical length screening starting at 16 weeks Refer for possible cerclage if cervical length < 2.5 cm  . Supervision of high risk pregnancy in first trimester 11/15/2018   Clinic [facility] Prenatal Labs Dating  Blood type: B/Positive/-- (09/04 1216)  Genetic Screen 1 Screen:    AFP:     Quad:     NIPS: Antibody:Negative (09/04 1216) Anatomic [place]  Rubella: 5.05 (09/04 1216) Varicella: @VZVIGG @ GTT Early:               Third trimester:  RPR: Non Reactive (09/04 1216)  Rhogam  HBsAg: Negative (09/04 1216)  TDaP  vaccine                       Flu Shot: HIV: Non Reactive (  . Thyroid disease   . Type 2 diabetes mellitus with hyperosmolarity without coma, with long-term current use of insulin (HCC) 08/03/2017    Social History   Socioeconomic History  . Marital status: Married    Spouse name: Ryheam 08/05/2017  . Number of children: Not on file  . Years of education: Not on file  . Highest education level: Not on file  Occupational History  . Occupation: EC Teacher  Tobacco Use  . Smoking status: Never Smoker  . Smokeless tobacco: Never Used  Vaping Use  . Vaping Use: Never used  Substance and Sexual Activity  . Alcohol use: No  . Drug use: No  . Sexual activity: Yes    Partners: Male    Birth control/protection: None  Other Topics Concern  . Not on file  Social History Narrative   Works as a Sales promotion account executive for [employer].   2 children.   Enjoys hiking, spending time with family, concerts   Social Determinants of Health   Financial Resource Strain: Not on file  Food Insecurity: Not on file  Transportation Needs: Not on file  Physical Activity: Not on file  Stress: Not on file  Social Connections:  Not on file  Intimate Partner Violence: Not on file    Past Surgical History:  Procedure Laterality Date  . CESAREAN SECTION      Family History  Problem Relation Age of Onset  . Diabetes Mother   . Hypertension Mother   . Hypothyroidism Mother   . Diabetes Father   . Kidney disease Maternal Grandmother   . Leukemia Maternal Grandfather   . Diabetes Paternal Grandmother   . Breast cancer Maternal Aunt 43       has contact    No Known Allergies  Current Outpatient Medications on File Prior to Visit  Medication Sig Dispense Refill  . glucose blood test strip Use as instructed 100 each 12  . Insulin Glargine (BASAGLAR KWIKPEN) 100 UNIT/ML Inject 24 Units into the skin 2 (two) times daily. 15 mL 0  . levothyroxine (SYNTHROID) 50 MCG tablet Take 1 tablet by mouth every  morning on an empty stomach with water only.  No food or other medications for 30 minutes. 90 tablet 3  . metFORMIN (GLUCOPHAGE-XR) 500 MG 24 hr tablet Take 2 tablets (1,000 mg total) by mouth 2 (two) times daily. NEEDS OFFICE VISIT 360 tablet 0  . VICTOZA 18 MG/3ML SOPN ADMINISTER 1.2 MG UNDER THE SKIN DAILY FOR DIABETES 6 mL 0   No current facility-administered medications on file prior to visit.    BP 124/76   Pulse 97   Temp 98.4 F (36.9 C) (Temporal)   Ht 5\' 2"  (1.575 m)   Wt 264 lb (119.7 kg)   SpO2 98%   BMI 48.29 kg/m  Objective:   Physical Exam Cardiovascular:     Rate and Rhythm: Normal rate and regular rhythm.  Pulmonary:     Effort: Pulmonary effort is normal.     Breath sounds: Normal breath sounds.  Musculoskeletal:     Cervical back: Neck supple.  Skin:    General: Skin is warm and dry.           Assessment & Plan:      This visit occurred during the SARS-CoV-2 public health emergency.  Safety protocols were in place, including screening questions prior to the visit, additional usage of staff PPE, and extensive cleaning of exam room while observing appropriate contact time as indicated for disinfecting solutions.

## 2020-08-24 ENCOUNTER — Telehealth: Payer: Self-pay

## 2020-08-24 NOTE — Telephone Encounter (Signed)
Reyanna Nanda Key: [id number] - PA Case ID: [id number] Need help? Call us at [phone] Outcome Approvedtoday Your PA request has been approved. Additional information will be provided in the approval communication. (Message [phone]) Drug Dexcom G6 Transmitter Form [employer] Electronic PA Form [phone] NCPDP)

## 2020-08-27 NOTE — Telephone Encounter (Signed)
Approval received good from 08/24/2020 to 08/24/2021. Form sent to scan in chart.

## 2020-09-11 ENCOUNTER — Other Ambulatory Visit: Payer: Self-pay | Admitting: Primary Care

## 2020-09-11 DIAGNOSIS — E1100 Type 2 diabetes mellitus with hyperosmolarity without nonketotic hyperglycemic-hyperosmolar coma (NKHHC): Secondary | ICD-10-CM

## 2020-09-11 DIAGNOSIS — Z794 Long term (current) use of insulin: Secondary | ICD-10-CM

## 2020-09-29 ENCOUNTER — Ambulatory Visit: Payer: BC Managed Care – PPO | Admitting: Primary Care

## 2020-11-21 ENCOUNTER — Other Ambulatory Visit: Payer: Self-pay | Admitting: Primary Care

## 2020-11-21 DIAGNOSIS — E119 Type 2 diabetes mellitus without complications: Secondary | ICD-10-CM

## 2020-11-21 DIAGNOSIS — Z794 Long term (current) use of insulin: Secondary | ICD-10-CM

## 2020-11-21 NOTE — Telephone Encounter (Signed)
Office visit required for further refills.  She is overdue for diabetes follow up, please schedule.

## 2020-11-22 ENCOUNTER — Telehealth: Payer: Self-pay | Admitting: Primary Care

## 2020-11-22 NOTE — Telephone Encounter (Signed)
I scheduled pt a appt. For a o/v in 10.12.22

## 2020-11-22 NOTE — Telephone Encounter (Signed)
Patient appointment set up for 10.12.22. did you want to see her before then?

## 2020-11-22 NOTE — Telephone Encounter (Signed)
Sent to provider in refill request.  

## 2020-12-02 ENCOUNTER — Other Ambulatory Visit: Payer: Self-pay | Admitting: Primary Care

## 2020-12-02 DIAGNOSIS — E119 Type 2 diabetes mellitus without complications: Secondary | ICD-10-CM

## 2020-12-02 DIAGNOSIS — Z794 Long term (current) use of insulin: Secondary | ICD-10-CM

## 2020-12-06 ENCOUNTER — Other Ambulatory Visit: Payer: Self-pay

## 2020-12-06 DIAGNOSIS — Z794 Long term (current) use of insulin: Secondary | ICD-10-CM

## 2020-12-10 ENCOUNTER — Other Ambulatory Visit: Payer: Self-pay | Admitting: Primary Care

## 2020-12-10 DIAGNOSIS — O99280 Endocrine, nutritional and metabolic diseases complicating pregnancy, unspecified trimester: Secondary | ICD-10-CM

## 2020-12-10 DIAGNOSIS — E039 Hypothyroidism, unspecified: Secondary | ICD-10-CM

## 2020-12-22 ENCOUNTER — Ambulatory Visit: Payer: BC Managed Care – PPO | Admitting: Primary Care

## 2020-12-27 ENCOUNTER — Other Ambulatory Visit: Payer: Self-pay | Admitting: Primary Care

## 2020-12-27 DIAGNOSIS — E119 Type 2 diabetes mellitus without complications: Secondary | ICD-10-CM

## 2020-12-27 DIAGNOSIS — Z794 Long term (current) use of insulin: Secondary | ICD-10-CM

## 2020-12-30 ENCOUNTER — Other Ambulatory Visit: Payer: Self-pay | Admitting: Primary Care

## 2020-12-30 DIAGNOSIS — E1100 Type 2 diabetes mellitus with hyperosmolarity without nonketotic hyperglycemic-hyperosmolar coma (NKHHC): Secondary | ICD-10-CM

## 2020-12-30 DIAGNOSIS — Z794 Long term (current) use of insulin: Secondary | ICD-10-CM

## 2021-01-20 ENCOUNTER — Ambulatory Visit: Payer: BC Managed Care – PPO | Admitting: Primary Care

## 2021-01-28 ENCOUNTER — Encounter: Payer: Self-pay | Admitting: Primary Care

## 2021-01-28 ENCOUNTER — Ambulatory Visit: Payer: BC Managed Care – PPO | Admitting: Primary Care

## 2021-01-28 ENCOUNTER — Other Ambulatory Visit: Payer: Self-pay

## 2021-01-28 VITALS — BP 118/70 | HR 74 | Temp 98.8°F | Ht 62.0 in | Wt 262.0 lb

## 2021-01-28 DIAGNOSIS — Z794 Long term (current) use of insulin: Secondary | ICD-10-CM | POA: Diagnosis not present

## 2021-01-28 DIAGNOSIS — E1165 Type 2 diabetes mellitus with hyperglycemia: Secondary | ICD-10-CM | POA: Diagnosis not present

## 2021-01-28 DIAGNOSIS — R202 Paresthesia of skin: Secondary | ICD-10-CM | POA: Diagnosis not present

## 2021-01-28 DIAGNOSIS — E039 Hypothyroidism, unspecified: Secondary | ICD-10-CM | POA: Diagnosis not present

## 2021-01-28 DIAGNOSIS — E119 Type 2 diabetes mellitus without complications: Secondary | ICD-10-CM

## 2021-01-28 LAB — COMPREHENSIVE METABOLIC PANEL
ALT: 16 U/L (ref 0–35)
AST: 15 U/L (ref 0–37)
Albumin: 4 g/dL (ref 3.5–5.2)
Alkaline Phosphatase: 82 U/L (ref 39–117)
BUN: 9 mg/dL (ref 6–23)
CO2: 29 mEq/L (ref 19–32)
Calcium: 9 mg/dL (ref 8.4–10.5)
Chloride: 102 mEq/L (ref 96–112)
Creatinine, Ser: 0.79 mg/dL (ref 0.40–1.20)
GFR: 100.86 mL/min (ref >60.00)
Glucose, Bld: 150 mg/dL — ABNORMAL HIGH (ref 70–99)
Potassium: 4 mEq/L (ref 3.5–5.1)
Sodium: 137 mEq/L (ref 135–145)
Total Bilirubin: 0.6 mg/dL (ref 0.2–1.2)
Total Protein: 7 g/dL (ref 6.0–8.3)

## 2021-01-28 LAB — MICROALBUMIN / CREATININE URINE RATIO
Creatinine,U: 170.2 mg/dL
Microalb Creat Ratio: 0.4 mg/g (ref 0.0–30.0)
Microalb, Ur: <0.7 mg/dL (ref 0.0–1.9)

## 2021-01-28 LAB — LIPID PANEL
Cholesterol: 148 mg/dL (ref 0–200)
HDL: 43.1 mg/dL (ref >39.00)
LDL Cholesterol: 81 mg/dL (ref 0–99)
NonHDL: 104.94
Total CHOL/HDL Ratio: 3
Triglycerides: 119 mg/dL (ref 0.0–149.0)
VLDL: 23.8 mg/dL (ref 0.0–40.0)

## 2021-01-28 LAB — CBC
HCT: 35.5 % — ABNORMAL LOW (ref 36.0–46.0)
Hemoglobin: 11.3 g/dL — ABNORMAL LOW (ref 12.0–15.0)
MCHC: 31.9 g/dL (ref 30.0–36.0)
MCV: 80.3 fl (ref 78.0–100.0)
Platelets: 335 10*3/uL (ref 150.0–400.0)
RBC: 4.42 Mil/uL (ref 3.87–5.11)
RDW: 14.4 % (ref 11.5–15.5)
WBC: 7.9 10*3/uL (ref 4.0–10.5)

## 2021-01-28 LAB — TSH: TSH: 1.79 u[IU]/mL (ref 0.35–5.50)

## 2021-01-28 LAB — POCT GLYCOSYLATED HEMOGLOBIN (HGB A1C)
HbA1c POC (<> result, manual entry): 8.5 % (ref 4.0–5.6)
Hemoglobin A1C: 8.5 % — AB (ref 4.0–5.6)

## 2021-01-28 LAB — VITAMIN B12: Vitamin B-12: 303 pg/mL (ref 211–911)

## 2021-01-28 MED ORDER — GLUCOSE BLOOD VI STRP: ORAL_STRIP | 11 refills | Status: DC

## 2021-01-28 MED ORDER — METFORMIN HCL ER 500 MG PO TB24: 1000.0000 mg | ORAL_TABLET | Freq: Two times a day (BID) | ORAL | 1 refills | Status: DC

## 2021-01-28 MED ORDER — FREESTYLE LIBRE 3 SENSOR MISC: 1.0000 | Freq: Four times a day (QID) | 3 refills | Status: DC

## 2021-01-28 MED ORDER — VICTOZA 18 MG/3ML ~~LOC~~ SOPN: PEN_INJECTOR | SUBCUTANEOUS | 1 refills | Status: DC

## 2021-01-28 NOTE — Progress Notes (Signed)
Subjective:    Patient ID: Katie [name], female    DOB: 1990-04-01, 30 y.o.   MRN: [id number]  HPI  Katie [name] is a very pleasant 30 y.o. female with a history of type 2 diabetes, hypothyroidism, obesity who presents today for follow uo of chronic conditions.  1) Type 2 Diabetes:  Current medications include: Basaglar 24 units BID, Metformin XR 1000 mg daily, Victoza 1.8 mg daily, GLipizide XL 10 mg.   She is checking her blood glucose very infrequently. She brings a few readings ranging from low 100's high 100's. She doesn't like to check glucose readings, she also forgets often.  Last A1C: 9.5 in June 2022, 8.5 today Last Eye Exam: Due, scheduled for January  Last Foot Exam: Due Pneumonia Vaccination: Never completed  Urine Microalbumin: Due Statin: None.   Dietary changes since last visit: She is working on her diet, has removed junk food at work, is aware of her snacking habits, tries to meal plan during the week.    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 She isn't sure if she will pass the DOT physical.   2) Hypothyroidism: Currently managed on levothyroxine 50 mcg. She is taking levothyroxine every morning on an empty stomach with water only. No food or other mediation for 30 min. No vitamins, PPI use, iron, or magnesium for 4 hours.   She is due for repeat TSH.   She has noticed fatigue, "I'm dragging" for the last month. Also with paresthesias to her hands intermittently for a month. 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   Review of Systems  Eyes:  Positive for visual disturbance.  Respiratory:  Negative for shortness of breath.   Cardiovascular:  Negative for chest pain.  Neurological:  Positive for numbness. Negative for dizziness and headaches.        Past Medical History:  Diagnosis  Date   Diabetes mellitus without complication ([place])    Family history of breast cancer    9/20 cancer genetic testing letter sent   History of cesarean section complicating pregnancy [id number]   History of preterm delivery 12/05/2018   Recommend serial cervical length screening starting at 16 weeks Refer for possible cerclage if cervical length < 2.5 cm   Supervision of high risk pregnancy in first trimester 11/15/2018   [facility]inic [facility] Prenatal Labs Dating  Blood type: B/Positive/-- (09/04 1216)  Genetic Screen 1 Screen:    AFP:     Quad:     NIPS: Antibody:Negative (09/04 1216) Anatomic [place]  Rubella: 5.05 (09/04 1216) Varicella: @VZVIGG @ GTT Early:               Third trimester:  RPR: Non Reactive (09/04 1216)  Rhogam  HBsAg: Negative (09/04 1216)  TDaP vaccine                       Flu Shot: HIV: Non Reactive (   Thyroid disease    Type 2 diabetes mellitus with hyperosmolarity without coma, with long-term current use of insulin ([place]) 08/03/2017    Social History   Socioeconomic History   Marital status: Married    Spouse name: [employer] risk surveyor   Number of children: Not on file   Years of education: Not on file   Highest education level:  Not on file  Occupational History   Occupation: [employer] Teacher  Tobacco Use   Smoking status: Never   Smokeless tobacco: Never  Vaping Use   Vaping Use: Never used  Substance and Sexual Activity   Alcohol use: No   Drug use: No   Sexual activity: Yes    Partners: Male    Birth control/protection: None  Other Topics Concern   Not on file  Social History Narrative   Works as a Runner, 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   Enjoys hiking, spending time with family, concerts   Social Determinants of Health   Financial Resource Strain: Not on file  Food Insecurity: Not on file  Transportation Needs: Not on file  Physical Activity: Not on file  Stress: Not on file  Social Connections: Not on file  Intimate Partner Violence: Not on file     Past Surgical History:  Procedure Laterality Date   CESAREAN SECTION      Family History  Problem Relation Age of Onset   Diabetes Mother    Hypertension Mother    Hypothyroidism Mother    Diabetes Father    Kidney disease Maternal Grandmother    Leukemia Maternal Grandfather    Diabetes Paternal Grandmother    Breast cancer Maternal Aunt 74       has contact    No Known Allergies  Current Outpatient Medications on File Prior to Visit  Medication Sig Dispense Refill   glipiZIDE (GLUCOTROL XL) 10 MG 24 hr tablet Take 1 tablet (10 mg total) by mouth daily with breakfast. For diabetes. Office visit required for further refills. 30 tablet 0   glucose blood test strip Use as instructed 100 each 12   Insulin Glargine (BASAGLAR KWIKPEN) 100 UNIT/ML Inject 24 Units into the skin 2 (two) times daily. For diabetes. Office visit required for further refills. 15 mL 0   levothyroxine (SYNTHROID) 50 MCG tablet Take 1 tablet by mouth every morning on an empty stomach with water only.  No food or other medications for 30 minutes. Office visit required for further refills. 30 tablet 0   liraglutide (VICTOZA) 18 MG/3ML SOPN ADMINISTER 1.8 MG UNDER THE SKIN DAILY FOR DIABETES. Office visit required for further refills. 6 mL 0   metFORMIN (GLUCOPHAGE-XR) 500 MG 24 hr tablet Take 2 tablets (1,000 mg total) by mouth daily with breakfast. For diabetes. 180 tablet 1   No current facility-administered medications on file prior to visit.    BP 118/70   Pulse 74   Temp 98.8 F (37.1 C) (Temporal)   Ht 5\' 2"  (1.575 m)   Wt 262 lb (118.8 kg)   SpO2 99%   BMI 47.92 kg/m  Objective:   Physical Exam HENT:     Nose:     Right Sinus: No maxillary sinus tenderness or frontal sinus tenderness.     Left Sinus: No maxillary sinus tenderness or frontal sinus tenderness.     Mouth/Throat:     Pharynx: No posterior oropharyngeal erythema.  Cardiovascular:     Rate and Rhythm: Normal rate and regular  rhythm.  Pulmonary:     Effort: Pulmonary effort is normal.     Breath sounds: Normal breath sounds. No wheezing or rales.  Musculoskeletal:     Cervical back: Neck supple.  Lymphadenopathy:     Cervical: No cervical adenopathy.  Skin:    General: Skin is warm and dry.          Assessment & Plan:  This visit occurred during the SARS-CoV-2 public health emergency.  Safety protocols were in place, including screening questions prior to the visit, additional usage of staff PPE, and extensive cleaning of exam room while observing appropriate contact time as indicated for disinfecting solutions.

## 2021-01-28 NOTE — Assessment & Plan Note (Addendum)
To hands only.  Likely from carpal tunnel, no neck or upper extremity pain/numbness.  Checking B12 level.  Discussed braces when typing at work.

## 2021-01-28 NOTE — Assessment & Plan Note (Signed)
Compliant to levothyroxine 50 mcg, is taking correctly.  Repeat TSH pending.

## 2021-01-28 NOTE — Assessment & Plan Note (Signed)
Improved but uncontrolled with A1C today of 8.5. Goal is for A1C of <7. Discussed this today  Again discussed the absolute need to check glucose levels at least twice daily as she is on insulin. We provided her with a FreeStyle Libre 3 sample and instructions for use. We sent a FreeStyle Libre Rx to her pharmacy.   Continue Victoza 1.8 mg daily. Continue Glipizide XL 10 mg daily. Increase metformin to 1000 mg BID.  Continue Basaglar 24 units BID.   We will see her back in 3 months. She will update sooner if glucose readings remain above 150 consistently.

## 2021-01-28 NOTE — Patient Instructions (Signed)
Stop by the lab prior to leaving today. I will notify you of your results once received.   Increase your metformin to 1000 mg twice daily gradually as discussed.  Try the FreeStyle [place] 3 sample. Let me know if the prescription is too costly. Without insurance you can have it cash price for $75 per month.   Notify me if you see readings on average consistently at or above 150 after a few weeks.  It was a pleasure to see you today!  Diabetes Mellitus and Nutrition, Adult When you have diabetes, or diabetes mellitus, it is very important to have healthy eating habits because your blood sugar (glucose) levels are greatly affected by what you eat and drink. Eating healthy foods in the right amounts, at about the same times every day, can help you: Manage your blood glucose. Lower your risk of heart disease. Improve your blood pressure. Reach or maintain a healthy weight. What can affect my meal plan? Every person with diabetes is different, and each person has different needs for a meal plan. Your health care provider may recommend that you work with a dietitian to make a meal plan that is best for you. Your meal plan may vary depending on factors such as: The calories you need. The medicines you take. Your weight. Your blood glucose, blood pressure, and cholesterol levels. Your activity level. Other health conditions you have, such as heart or kidney disease. How do carbohydrates affect me? Carbohydrates, also called carbs, affect your blood glucose level more than any other type of food. Eating carbs raises the amount of glucose in your blood. It is important to know how many carbs you can safely have in each meal. This is different for every person. Your dietitian can help you calculate how many carbs you should have at each meal and for each snack. How does alcohol affect me? Alcohol can cause a decrease in blood glucose (hypoglycemia), especially if you use insulin or take certain  diabetes medicines by mouth. Hypoglycemia can be a life-threatening condition. Symptoms of hypoglycemia, such as sleepiness, dizziness, and confusion, are similar to symptoms of having too much alcohol. Do not drink alcohol if: Your health care provider tells you not to drink. You are pregnant, may be pregnant, or are planning to become pregnant. If you drink alcohol: Limit how much you have to: 0-1 drink a day for women. 0-2 drinks a day for men. Know how much alcohol is in your drink. In the [place] one drink equals one 12 oz bottle of beer (355 mL), one 5 oz glass of wine (148 mL), or one 1 oz glass of hard liquor (44 mL). Keep yourself hydrated with water, diet soda, or unsweetened iced tea. Keep in mind that regular soda, juice, and other mixers may contain a lot of sugar and must be counted as carbs. What are tips for following this plan? Reading food labels Start by checking the serving size on the Nutrition Facts label of packaged foods and drinks. The number of calories and the amount of carbs, fats, and other nutrients listed on the label are based on one serving of the item. Many items contain more than one serving per package. Check the total grams (g) of carbs in one serving. Check the number of grams of saturated fats and trans fats in one serving. Choose foods that have a low amount or none of these fats. Check the number of milligrams (mg) of salt (sodium) in one serving. Most people should  limit total sodium intake to less than 2,300 mg per day. Always check the nutrition information of foods labeled as "low-fat" or "nonfat." These foods may be higher in added sugar or refined carbs and should be avoided. Talk to your dietitian to identify your daily goals for nutrients listed on the label. Shopping Avoid buying canned, pre-made, or processed foods. These foods tend to be high in fat, sodium, and added sugar. Shop around the outside edge of the grocery store. This is where you  will most often find fresh fruits and vegetables, bulk grains, fresh meats, and fresh dairy products. Cooking Use low-heat cooking methods, such as baking, instead of high-heat cooking methods, such as deep frying. Cook using healthy oils, such as olive, canola, or sunflower oil. Avoid cooking with butter, cream, or high-fat meats. Meal planning Eat meals and snacks regularly, preferably at the same times every day. Avoid going long periods of time without eating. Eat foods that are high in fiber, such as fresh fruits, vegetables, beans, and whole grains. Eat 4-6 oz (112-168 g) of lean protein each day, such as lean meat, chicken, fish, eggs, or tofu. One ounce (oz) (28 g) of lean protein is equal to: 1 oz (28 g) of meat, chicken, or fish. 1 egg.  cup (62 g) of tofu. Eat some foods each day that contain healthy fats, such as avocado, nuts, seeds, and fish. What foods should I eat? Fruits Berries. Apples. Oranges. Peaches. Apricots. Plums. Grapes. Mangoes. Papayas. Pomegranates. Kiwi. Cherries. Vegetables Leafy greens, including lettuce, spinach, kale, chard, collard greens, mustard greens, and cabbage. Beets. Cauliflower. Broccoli. Carrots. Green beans. Tomatoes. Peppers. Onions. Cucumbers. Brussels sprouts. Grains Whole grains, such as whole-wheat or whole-grain bread, crackers, tortillas, cereal, and pasta. Unsweetened oatmeal. Quinoa. Brown or wild rice. Meats and other proteins Seafood. Poultry without skin. Lean cuts of poultry and beef. Tofu. Nuts. Seeds. Dairy Low-fat or fat-free dairy products such as milk, yogurt, and cheese. The items listed above may not be a complete list of foods and beverages you can eat and drink. Contact a dietitian for more information. What foods should I avoid? Fruits Fruits canned with syrup. Vegetables Canned vegetables. Frozen vegetables with butter or cream sauce. Grains Refined white flour and flour products such as bread, pasta, snack foods,  and cereals. Avoid all processed foods. Meats and other proteins Fatty cuts of meat. Poultry with skin. Breaded or fried meats. Processed meat. Avoid saturated fats. Dairy Full-fat yogurt, cheese, or milk. Beverages Sweetened drinks, such as soda or iced tea. The items listed above may not be a complete list of foods and beverages you should avoid. Contact a dietitian for more information. Questions to ask a health care provider Do I need to meet with a certified diabetes care and education specialist? Do I need to meet with a dietitian? What number can I call if I have questions? When are the best times to check my blood glucose? Where to find more information: American Diabetes Association: diabetes.org Academy of Nutrition and Dietetics: eatright.[employer] of Diabetes and Digestive and Kidney Diseases: StageSync.si Association of Diabetes Care & Education Specialists: diabeteseducator.org Summary It is important to have healthy eating habits because your blood sugar (glucose) levels are greatly affected by what you eat and drink. It is important to use alcohol carefully. A healthy meal plan will help you manage your blood glucose and lower your risk of heart disease. Your health care provider may recommend that you work with a dietitian to make  a meal plan that is best for you. This information is not intended to replace advice given to you by your health care provider. Make sure you discuss any questions you have with your health care provider. Document Revised: 10/01/2019 Document Reviewed: 10/01/2019 Elsevier Patient Education  2022 [employer].

## 2021-01-29 ENCOUNTER — Other Ambulatory Visit: Payer: Self-pay | Admitting: Primary Care

## 2021-01-29 DIAGNOSIS — Z794 Long term (current) use of insulin: Secondary | ICD-10-CM

## 2021-01-29 DIAGNOSIS — E1165 Type 2 diabetes mellitus with hyperglycemia: Secondary | ICD-10-CM

## 2021-02-06 ENCOUNTER — Other Ambulatory Visit: Payer: Self-pay | Admitting: Primary Care

## 2021-02-06 DIAGNOSIS — E1100 Type 2 diabetes mellitus with hyperosmolarity without nonketotic hyperglycemic-hyperosmolar coma (NKHHC): Secondary | ICD-10-CM

## 2021-02-08 ENCOUNTER — Other Ambulatory Visit: Payer: Self-pay | Admitting: Primary Care

## 2021-02-08 DIAGNOSIS — O99280 Endocrine, nutritional and metabolic diseases complicating pregnancy, unspecified trimester: Secondary | ICD-10-CM

## 2021-02-08 DIAGNOSIS — E039 Hypothyroidism, unspecified: Secondary | ICD-10-CM

## 2021-03-17 ENCOUNTER — Other Ambulatory Visit: Payer: Self-pay | Admitting: Primary Care

## 2021-03-17 DIAGNOSIS — Z794 Long term (current) use of insulin: Secondary | ICD-10-CM

## 2021-03-17 DIAGNOSIS — E119 Type 2 diabetes mellitus without complications: Secondary | ICD-10-CM

## 2021-03-24 LAB — HM DIABETES EYE EXAM

## 2021-04-09 ENCOUNTER — Other Ambulatory Visit: Payer: Self-pay | Admitting: Primary Care

## 2021-04-09 DIAGNOSIS — E1165 Type 2 diabetes mellitus with hyperglycemia: Secondary | ICD-10-CM

## 2021-04-19 ENCOUNTER — Encounter: Payer: Self-pay | Admitting: Primary Care

## 2021-04-25 ENCOUNTER — Other Ambulatory Visit: Payer: Self-pay | Admitting: Primary Care

## 2021-04-25 DIAGNOSIS — E1165 Type 2 diabetes mellitus with hyperglycemia: Secondary | ICD-10-CM

## 2021-04-25 DIAGNOSIS — Z794 Long term (current) use of insulin: Secondary | ICD-10-CM

## 2021-05-11 ENCOUNTER — Encounter: Payer: BC Managed Care – PPO | Admitting: Primary Care

## 2021-05-17 ENCOUNTER — Encounter: Payer: BC Managed Care – PPO | Admitting: Primary Care

## 2021-06-27 ENCOUNTER — Other Ambulatory Visit: Payer: Self-pay | Admitting: Primary Care

## 2021-06-27 DIAGNOSIS — E1165 Type 2 diabetes mellitus with hyperglycemia: Secondary | ICD-10-CM

## 2021-06-27 DIAGNOSIS — Z794 Long term (current) use of insulin: Secondary | ICD-10-CM

## 2021-06-27 NOTE — Telephone Encounter (Signed)
Patient is overdue for diabetes follow up and must be seen for further refills. ? ?Let me know when she is scheduled. ?

## 2021-06-30 NOTE — Telephone Encounter (Signed)
Left message to return call to our office.  

## 2021-07-01 NOTE — Telephone Encounter (Signed)
Left message on voicemail to return my call.  

## 2021-07-06 NOTE — Telephone Encounter (Signed)
Left detailed message on voicemail (on DPR) that she needs to schedule an appointment for further refills.Requested that patient call the office and schedule an appointment. ?

## 2021-07-07 ENCOUNTER — Telehealth: Payer: Self-pay | Admitting: Primary Care

## 2021-07-07 DIAGNOSIS — E1165 Type 2 diabetes mellitus with hyperglycemia: Secondary | ICD-10-CM

## 2021-07-07 DIAGNOSIS — Z794 Long term (current) use of insulin: Secondary | ICD-10-CM

## 2021-07-07 MED ORDER — BASAGLAR KWIKPEN 100 UNIT/ML ~~LOC~~ SOPN: PEN_INJECTOR | SUBCUTANEOUS | 0 refills | Status: DC

## 2021-07-07 NOTE — Telephone Encounter (Signed)
Encourage patient to contact the pharmacy for refills or they can request refills through [facility] ? ?Did the patient contact the pharmacy: They contacted Katie [name] ? ?LAST APPOINTMENT DATE: 01/28/2021 ? ?NEXT APPOINTMENT DATE: 07/20/2021 ? ?MEDICATION:  VICTOZA 18 MG/3ML SOPN ? ?Is the patient out of medication? Will be out Saturday ? ?If not, how much is left? N/A ? ?Is this a 90 day supply: 1 month supply ? ?PHARMACY: [id number] - [place], [place] - [address] ? ?Let patient know to contact pharmacy at the end of the day to make sure medication is ready. ? ?Please notify patient to allow 48-72 hours to process ?  ?

## 2021-07-07 NOTE — Telephone Encounter (Signed)
Patient has been called x 3 no call back my chart sent to request call back.  ?

## 2021-07-07 NOTE — Telephone Encounter (Signed)
Encourage patient to contact the pharmacy for refills or they can request refills through [facility] ? ?Did the patient contact the pharmacy: They contacted Jae Dire ? ?LAST APPOINTMENT DATE: 01/28/2021 ? ?NEXT APPOINTMENT DATE: 07/20/2021 ? ?MEDICATION:  Insulin Glargine (BASAGLAR KWIKPEN) 100 UNIT/ML ? ?Is the patient out of medication? Will be out on Saturday as well ? ?If not, how much is left? N/A ? ?Is this a 90 day supply: 1 month supply ? ?PHARMACY:  [id number] - [place], [place] - [address] ? ?Let patient know to contact pharmacy at the end of the day to make sure medication is ready. ? ?Please notify patient to allow 48-72 hours to process ?  ?

## 2021-07-07 NOTE — Telephone Encounter (Signed)
Patient has follow up scheduled have sent in 30 day to local pharmacy as requested.  ?

## 2021-07-20 ENCOUNTER — Ambulatory Visit: Payer: BC Managed Care – PPO | Admitting: Primary Care

## 2021-07-20 ENCOUNTER — Encounter: Payer: Self-pay | Admitting: Primary Care

## 2021-07-20 VITALS — BP 118/64 | HR 88 | Temp 98.3°F | Ht 62.0 in | Wt 266.0 lb

## 2021-07-20 DIAGNOSIS — E1165 Type 2 diabetes mellitus with hyperglycemia: Secondary | ICD-10-CM

## 2021-07-20 DIAGNOSIS — F411 Generalized anxiety disorder: Secondary | ICD-10-CM

## 2021-07-20 DIAGNOSIS — Z794 Long term (current) use of insulin: Secondary | ICD-10-CM

## 2021-07-20 LAB — POCT GLYCOSYLATED HEMOGLOBIN (HGB A1C): Hemoglobin A1C: 8.8 % — AB (ref 4.0–5.6)

## 2021-07-20 MED ORDER — SERTRALINE HCL 25 MG PO TABS: 25.0000 mg | ORAL_TABLET | Freq: Every day | ORAL | 0 refills | Status: DC

## 2021-07-20 MED ORDER — TIRZEPATIDE 2.5 MG/0.5ML ~~LOC~~ SOAJ: 2.5000 mg | SUBCUTANEOUS | 0 refills | Status: DC

## 2021-07-20 MED ORDER — METFORMIN HCL ER 500 MG PO TB24: 1000.0000 mg | ORAL_TABLET | Freq: Every day | ORAL | 1 refills | Status: DC

## 2021-07-20 NOTE — Assessment & Plan Note (Signed)
Chronic and uncontrolled. ? ?Strongly advised both medication and therapy, she agrees.  She will reach out to her prior therapist through [facility]. ? ?Prescription for sertraline 25 mg sent to pharmacy. We discussed possible side effects of headache, GI upset, drowsiness, and SI/HI. ?If thoughts of SI/HI develop, we discussed to present to the emergency immediately. ?Patient verbalized understanding.  ? ?Follow up in 6 weeks for re-evaluation.  ? ?

## 2021-07-20 NOTE — Patient Instructions (Signed)
Stop Victoza for diabetes. ? ?Start Mounjaro 2.5 mg for diabetes.  Inject 2.5 mg into the skin once weekly for 4 weeks.  We will increase your dose to 5 mg weekly thereafter.  Please notify me once you have used your last pen of the 2.5 mg dose. ? ?We have reduced your metformin to 2 tablets in the morning. ? ?Continue glipizide and your insulin as prescribed ? ?Try to check your blood sugars at least once a day. ? ?Start sertraline (Zoloft) 25 mg for anxiety and depression.  ? ?Please schedule a follow up visit for 6 weeks for follow up of anxiety and diabetes.  ? ?It was a pleasure to see you today! ? ? ? ? ?

## 2021-07-20 NOTE — Progress Notes (Signed)
? ?Subjective:  ? ? Patient ID: Katie [name], female    DOB: 1990/06/24, 31 y.o.   MRN: [id number] ? ?HPI ? ?Katie [name] is a very pleasant 31 y.o. female with a history of uncontrolled type 2 diabetes, hypothyroidism, morbid obesity who presents today for follow-up of diabetes. She would also like to discuss anxiety.  ? ?1) Type 2 Diabetes; Current medications include: Glipizide XL 10 mg daily, metformin XR 1000 mg BID, Victoza 1.8 mg daily, Basaglar 24 units BID. ? ?She misses her second dose of metformin 3 times weekly due to GERD side effects.  ? ?She is checking her blood glucose 1 time monthly and is getting readings ranging 100's to low 300's. Mostly in the 200's ? ?Last A1C: 8.5 in November 2022, 8.8 today ?Last Eye Exam: UTD ?Last Foot Exam: UTD ?Pneumonia Vaccination: 2020 ?Urine Microalbumin: November 2022 ?Statin: None ? ?Dietary changes since last visit: She is working to improve her diet by reducing carbs. She continues to snack frequently. She mostly eats take out and fast food. She drinks a lot of sweet tea. ? ? ?Exercise: twice weekly on average.  ? ?2) GAD: Chronic for years, worse over the last 2-3 months. Symptoms include nervousness, GI upset, worrying a lot, mood swings, easily irritable. Symptoms occur daily.  Coworkers have started to notice. ? ?She has been taking CBD gummies with some improvement. She's never tried medication treatment and is afraid to start as she's heard bad things. She has a history of suicidal ideation, treated with CBT. Graduated one year ago from CBT, has not met with therapy since. ? ?She will become so anxious and upset that she will intentionally stop taking her medications for diabetes. ? ?Review of Systems  ?Eyes:  Negative for visual disturbance.  ?Respiratory:  Negative for shortness of breath.   ?Cardiovascular:  Negative for chest pain.  ?Neurological:  Negative for dizziness and numbness.  ?Psychiatric/Behavioral:  The patient is nervous/anxious.   ? ?    ? ? ?Past Medical History:  ?Diagnosis Date  ? Diabetes mellitus without complication ([place])   ? Family history of breast cancer   ? 9/20 cancer genetic testing letter sent  ? History of cesarean section complicating pregnancy 04/16/4008  ? History of preterm delivery 12/05/2018  ? Recommend serial cervical length screening starting at 16 weeks Refer for possible cerclage if cervical length < 2.5 cm  ? Supervision of high risk pregnancy in first trimester 11/15/2018  ? [facility] Prenatal Labs Dating  Blood type: B/Positive/-- (09/04 1216)  Genetic Screen 1? Screen:    AFP:     Quad:     NIPS: Antibody:Negative (09/04 1216) Anatomic [place]  Rubella: 5.05 (09/04 1216) Varicella: _0 @ GTT Early:               Third trimester:  RPR: Non Reactive (09/04 1216)  Rhogam  HBsAg: Negative (09/04 1216)  TDaP vaccine                       Flu Shot: HIV: Non Reactive (  ? Thyroid disease   ? Type 2 diabetes mellitus with hyperosmolarity without coma, with long-term current use of insulin ([place]) 08/03/2017  ? ? ?Social History  ? ?Socioeconomic History  ? Marital status: Married  ?  Spouse name: Ryheam Insurance risk surveyor  ? Number of children: Not on file  ? Years of education: Not on file  ? Highest education level: Not on file  ?Occupational History  ?  Occupation: Editor, commissioning  ?Tobacco Use  ? Smoking status: Never  ? Smokeless tobacco: Never  ?Vaping Use  ? Vaping Use: Never used  ?Substance and Sexual Activity  ? Alcohol use: No  ? Drug use: No  ? Sexual activity: Yes  ?  Partners: Male  ?  Birth control/protection: None  ?Other Topics Concern  ? Not on file  ?Social History Narrative  ? Works as a Pharmacist, hospital for [employer].  ? 2 children.  ? Enjoys hiking, spending time with family, concerts  ? ?Social Determinants of Health  ? ?Financial Resource Strain: Not on file  ?Food Insecurity: Not on file  ?Transportation Needs: Not on file  ?Physical Activity: Not on file  ?Stress: Not on file  ?Social Connections: Not on file   ?Intimate Partner Violence: Not on file  ? ? ?Past Surgical History:  ?Procedure Laterality Date  ? CESAREAN SECTION    ? ? ?Family History  ?Problem Relation Age of Onset  ? Diabetes Mother   ? Hypertension Mother   ? Hypothyroidism Mother   ? Diabetes Father   ? Kidney disease Maternal Grandmother   ? Leukemia Maternal Grandfather   ? Diabetes Paternal Grandmother   ? Breast cancer Maternal Aunt 43  ?     has contact  ? ? ?No Known Allergies ? ?Current Outpatient Medications on File Prior to Visit  ?Medication Sig Dispense Refill  ? glipiZIDE (GLUCOTROL XL) 10 MG 24 hr tablet TAKE 1 TABLET BY MOUTH DAILY WITH BREAKFAST FOR DIABETES 90 tablet 1  ? glucose blood test strip Use as instructed 200 each 11  ? Insulin Glargine (BASAGLAR KWIKPEN) 100 UNIT/ML INJECT 24 UNITS UNDER THE SKIN TWICE DAILY FOR DIABETES 30 mL 0  ? levothyroxine (SYNTHROID) 50 MCG tablet TAKE 1 TABLET BY MOUTH EVERY MORNING ON AN EMPTY STOMACH WITH WATER ONLY, NO FOOD OR OTHER MEDICATIONS FOR 30 MINUTES 90 tablet 3  ? Continuous Blood Gluc Sensor (FREESTYLE LIBRE 3 SENSOR) MISC 1 Device by Does not apply route 4 (four) times daily. Place 1 sensor on the skin every 14 days. Use to check glucose continuously (Patient not taking: Reported on 07/20/2021) 6 each 3  ? ?No current facility-administered medications on file prior to visit.  ? ? ?BP 118/64   Pulse 88   Temp 98.3 ?F (36.8 ?C) (Temporal)   Ht _0  (1.575 m)   Wt 266 lb (120.7 kg)   SpO2 99%   BMI 48.65 kg/m?  ?Objective:  ? Physical Exam ?Cardiovascular:  ?   Rate and Rhythm: Normal rate and regular rhythm.  ?Pulmonary:  ?   Effort: Pulmonary effort is normal.  ?   Breath sounds: Normal breath sounds.  ?Musculoskeletal:  ?   Cervical back: Neck supple.  ?Skin: ?   General: Skin is warm and dry.  ?Psychiatric:     ?   Mood and Affect: Mood normal.  ? ? ? ? ? ?   ?Assessment & Plan:  ? ? ? ? ?This visit occurred during the SARS-CoV-2 public health emergency.  Safety protocols were in  place, including screening questions prior to the visit, additional usage of staff PPE, and extensive cleaning of exam room while observing appropriate contact time as indicated for disinfecting solutions.  ?

## 2021-07-20 NOTE — Assessment & Plan Note (Signed)
Uncontrolled with A1c of 8.8 today. ? ?She continues to miss doses of her medications which seems to be secondary to uncontrolled anxiety.  Unfortunately she is experiencing side effects from metformin, also she is not checking glucose levels. ? ?Strongly advise she start checking glucose levels at least once daily.  Unfortunately we cannot adjust her insulin as she is not checking glucose levels. ? ?Continue Basaglar 24 units twice daily. ?Reduce metformin XR to 1000 mg in the morning. ?Continue glipizide XL 10 mg daily. ? ?Stop Victoza. ?Start Mounjaro 2.5 mg weekly x4 weeks, then increase to 5 mg weekly thereafter. ? ?Long discussion regarding the absolute need to change her diet, limit sugary drinks/sweets, work on better choices with [employer]. ? ?Follow-up in 6 weeks. ?

## 2021-08-02 ENCOUNTER — Telehealth: Payer: Self-pay

## 2021-08-02 DIAGNOSIS — E1165 Type 2 diabetes mellitus with hyperglycemia: Secondary | ICD-10-CM

## 2021-08-02 NOTE — Telephone Encounter (Signed)
Prior auth denied for [facility] 2.5MG /0.5ML pen-injectors.  Coverage for this medication is denied for the following reason(s). We reviewed the information we received about your condition and circumstances. We used plan approved criteria when making this decision. The policy states that this medication may be approved when: -The member is unable to take the required number of formulary alternatives for the given diagnosis due to an intolerance or contraindication OR -The member has tried and failed the required number of formulary alternatives.  Based on the policy and the information we received your request is denied. We did not receive documentation that you meet the criteria outlined above. Formulary alternatives are: Ozempic, Rybelsus, Trulicity, Victoza. (Requirement: 3 in a class with 3 or more alternatives, 2 in a class with 2 alternatives, or 1 in a class with only 1 alternative.)

## 2021-08-02 NOTE — Telephone Encounter (Signed)
Prior auth started for Maryclaire Barb (Key: [id number]) Greggory Keen 2.5MG /0.5ML pen-injectors Waiting for determination.

## 2021-08-03 MED ORDER — OZEMPIC (0.25 OR 0.5 MG/DOSE) 2 MG/3ML ~~LOC~~ SOPN: PEN_INJECTOR | SUBCUTANEOUS | 0 refills | Status: DC

## 2021-08-03 NOTE — Telephone Encounter (Signed)
Please have patient start with 0.25 mg injected into the skin once weekly x4 weeks, then increase to 0.5 mg once weekly thereafter.

## 2021-08-03 NOTE — Telephone Encounter (Signed)
Please notify patient that the [facility] prescription for diabetes was denied by her insurance company.  Does she want to try Trulicity or Ozempic for diabetes?  I believe that she would see better improvement in her readings.

## 2021-08-03 NOTE — Telephone Encounter (Signed)
Called patient she is fine with trying the Ozempic.

## 2021-08-04 NOTE — Telephone Encounter (Signed)
Called patient reviewed all information and repeated back to me. Will call if any questions.  She will let us know if any issues picking up at pharmacy

## 2021-08-09 DIAGNOSIS — Z794 Long term (current) use of insulin: Secondary | ICD-10-CM

## 2021-08-09 DIAGNOSIS — Z6841 Body Mass Index (BMI) 40.0 and over, adult: Secondary | ICD-10-CM

## 2021-08-25 ENCOUNTER — Telehealth: Payer: Self-pay

## 2021-08-25 NOTE — Telephone Encounter (Signed)
Go ahead and start we called on in a while ago but she didn't get filled she must have decided to get now  Ty

## 2021-08-25 NOTE — Telephone Encounter (Signed)
Called patient does not need auth did not pick up.

## 2021-08-25 NOTE — Telephone Encounter (Signed)
I received an email for Ms Dearman for a prior auth for [employer].  I do not see the prescription for this.  Should I go ahead and start the prior auth or disregard it?  Taleigha Chipley Key: [id number]

## 2021-08-25 NOTE — Telephone Encounter (Signed)
Called patient about auth received for dexon. Patient does not need to process that.   Patient would like to try the Trulicity. She would like that called into pharmacy. She has been out of the victoza for about a week. Wanted to know if you wanted her to take while we are getting everything in place for Trulicity? If so she will need refills.

## 2021-08-26 MED ORDER — TRULICITY 0.75 MG/0.5ML ~~LOC~~ SOAJ: 0.7500 mg | SUBCUTANEOUS | 0 refills | Status: DC

## 2021-08-26 NOTE — Telephone Encounter (Signed)
Trulicity sent to pharmacy.

## 2021-08-26 NOTE — Addendum Note (Signed)
Addended by: Lynnda Child on: 08/26/2021 02:43 PM   Modules accepted: Orders

## 2021-08-26 NOTE — Telephone Encounter (Signed)
Called patient let know script has been called in for Trulicity

## 2021-09-04 ENCOUNTER — Other Ambulatory Visit: Payer: Self-pay | Admitting: Primary Care

## 2021-09-04 DIAGNOSIS — Z794 Long term (current) use of insulin: Secondary | ICD-10-CM

## 2021-09-06 ENCOUNTER — Ambulatory Visit: Payer: BC Managed Care – PPO | Admitting: Primary Care

## 2021-09-17 ENCOUNTER — Other Ambulatory Visit: Payer: Self-pay | Admitting: Primary Care

## 2021-09-17 DIAGNOSIS — F411 Generalized anxiety disorder: Secondary | ICD-10-CM

## 2021-09-21 ENCOUNTER — Other Ambulatory Visit: Payer: Self-pay | Admitting: Family Medicine

## 2021-09-21 DIAGNOSIS — E1165 Type 2 diabetes mellitus with hyperglycemia: Secondary | ICD-10-CM

## 2021-09-21 DIAGNOSIS — Z6841 Body Mass Index (BMI) 40.0 and over, adult: Secondary | ICD-10-CM

## 2021-10-11 ENCOUNTER — Ambulatory Visit: Payer: BC Managed Care – PPO | Admitting: Primary Care

## 2021-10-25 ENCOUNTER — Other Ambulatory Visit: Payer: Self-pay | Admitting: Primary Care

## 2021-10-25 DIAGNOSIS — E1100 Type 2 diabetes mellitus with hyperosmolarity without nonketotic hyperglycemic-hyperosmolar coma (NKHHC): Secondary | ICD-10-CM

## 2021-10-26 ENCOUNTER — Ambulatory Visit: Payer: BC Managed Care – PPO | Admitting: Primary Care

## 2021-10-29 ENCOUNTER — Other Ambulatory Visit: Payer: Self-pay | Admitting: Primary Care

## 2021-10-29 DIAGNOSIS — E1165 Type 2 diabetes mellitus with hyperglycemia: Secondary | ICD-10-CM

## 2021-11-29 ENCOUNTER — Other Ambulatory Visit: Payer: Self-pay | Admitting: Primary Care

## 2021-11-29 DIAGNOSIS — E1165 Type 2 diabetes mellitus with hyperglycemia: Secondary | ICD-10-CM

## 2021-11-29 DIAGNOSIS — Z6841 Body Mass Index (BMI) 40.0 and over, adult: Secondary | ICD-10-CM

## 2021-11-29 DIAGNOSIS — E119 Type 2 diabetes mellitus without complications: Secondary | ICD-10-CM

## 2021-11-30 MED ORDER — TRULICITY 0.75 MG/0.5ML ~~LOC~~ SOAJ: 0.7500 mg | SUBCUTANEOUS | 0 refills | Status: DC

## 2021-11-30 MED ORDER — FREESTYLE LIBRE 3 SENSOR MISC: 5 refills | Status: DC

## 2021-12-13 ENCOUNTER — Telehealth: Payer: BC Managed Care – PPO | Admitting: Primary Care

## 2021-12-14 ENCOUNTER — Ambulatory Visit: Payer: BC Managed Care – PPO | Admitting: Primary Care

## 2021-12-14 ENCOUNTER — Other Ambulatory Visit (HOSPITAL_COMMUNITY)
Admission: RE | Admit: 2021-12-14 | Discharge: 2021-12-14 | Disposition: A | Discharge status: Home / Self Care | Payer: BC Managed Care – PPO | Source: Ambulatory Visit | Attending: Primary Care | Admitting: Primary Care

## 2021-12-14 ENCOUNTER — Encounter: Payer: Self-pay | Admitting: Primary Care

## 2021-12-14 VITALS — BP 138/80 | HR 76 | Temp 98.4°F | Ht 62.0 in | Wt 265.0 lb

## 2021-12-14 DIAGNOSIS — R350 Frequency of micturition: Secondary | ICD-10-CM | POA: Diagnosis not present

## 2021-12-14 DIAGNOSIS — N898 Other specified noninflammatory disorders of vagina: Secondary | ICD-10-CM

## 2021-12-14 DIAGNOSIS — E1165 Type 2 diabetes mellitus with hyperglycemia: Secondary | ICD-10-CM | POA: Diagnosis not present

## 2021-12-14 DIAGNOSIS — E039 Hypothyroidism, unspecified: Secondary | ICD-10-CM

## 2021-12-14 DIAGNOSIS — F411 Generalized anxiety disorder: Secondary | ICD-10-CM

## 2021-12-14 DIAGNOSIS — N920 Excessive and frequent menstruation with regular cycle: Secondary | ICD-10-CM

## 2021-12-14 DIAGNOSIS — Z124 Encounter for screening for malignant neoplasm of cervix: Secondary | ICD-10-CM | POA: Diagnosis present

## 2021-12-14 DIAGNOSIS — Z794 Long term (current) use of insulin: Secondary | ICD-10-CM

## 2021-12-14 DIAGNOSIS — E1100 Type 2 diabetes mellitus with hyperosmolarity without nonketotic hyperglycemic-hyperosmolar coma (NKHHC): Secondary | ICD-10-CM

## 2021-12-14 LAB — COMPREHENSIVE METABOLIC PANEL
ALT: 18 U/L (ref 0–35)
AST: 13 U/L (ref 0–37)
Albumin: 4.1 g/dL (ref 3.5–5.2)
Alkaline Phosphatase: 98 U/L (ref 39–117)
BUN: 10 mg/dL (ref 6–23)
CO2: 31 mEq/L (ref 19–32)
Calcium: 9.8 mg/dL (ref 8.4–10.5)
Chloride: 98 mEq/L (ref 96–112)
Creatinine, Ser: 0.76 mg/dL (ref 0.40–1.20)
GFR: 105.01 mL/min (ref >60.00)
Glucose, Bld: 333 mg/dL — ABNORMAL HIGH (ref 70–99)
Potassium: 4.8 mEq/L (ref 3.5–5.1)
Sodium: 136 mEq/L (ref 135–145)
Total Bilirubin: 0.7 mg/dL (ref 0.2–1.2)
Total Protein: 7.2 g/dL (ref 6.0–8.3)

## 2021-12-14 LAB — POC URINALSYSI DIPSTICK (AUTOMATED)
Bilirubin, UA: NEGATIVE
Blood, UA: NEGATIVE
Glucose, UA: POSITIVE — AB
Ketones, UA: NEGATIVE
Leukocytes, UA: NEGATIVE
Nitrite, UA: NEGATIVE
Protein, UA: NEGATIVE
Spec Grav, UA: 1.02 (ref 1.010–1.025)
Urobilinogen, UA: 0.2 E.U./dL
pH, UA: 5.5 (ref 5.0–8.0)

## 2021-12-14 LAB — CBC
HCT: 36.3 % (ref 36.0–46.0)
Hemoglobin: 11.7 g/dL — ABNORMAL LOW (ref 12.0–15.0)
MCHC: 32.1 g/dL (ref 30.0–36.0)
MCV: 81.7 fl (ref 78.0–100.0)
Platelets: 319 10*3/uL (ref 150.0–400.0)
RBC: 4.45 Mil/uL (ref 3.87–5.11)
RDW: 15.2 % (ref 11.5–15.5)
WBC: 7.4 10*3/uL (ref 4.0–10.5)

## 2021-12-14 LAB — POCT GLYCOSYLATED HEMOGLOBIN (HGB A1C): Hemoglobin A1C: 11.1 % — AB (ref 4.0–5.6)

## 2021-12-14 LAB — TSH: TSH: 2.43 u[IU]/mL (ref 0.35–5.50)

## 2021-12-14 MED ORDER — SERTRALINE HCL 50 MG PO TABS: 50.0000 mg | ORAL_TABLET | Freq: Every day | ORAL | 1 refills | Status: DC

## 2021-12-14 MED ORDER — BASAGLAR KWIKPEN 100 UNIT/ML ~~LOC~~ SOPN: PEN_INJECTOR | SUBCUTANEOUS | 0 refills | Status: DC

## 2021-12-14 MED ORDER — HYDROXYZINE HCL 10 MG PO TABS: 10.0000 mg | ORAL_TABLET | Freq: Every day | ORAL | 0 refills | Status: DC | PRN

## 2021-12-14 NOTE — Progress Notes (Signed)
Subjective:    Patient ID: Katie [name], female    DOB: 15-Apr-1990, 31 y.o.   MRN: [id number]  Vaginal Itching Associated symptoms include dysuria and frequency.    Katie [name] is a very pleasant 31 y.o. female with a history of type 2 diabetes, hypothyroidism, vaginal itching who presents today for follow up of chronic conditions and to discuss vaginal itching.  1) Type 2 Diabetes:  Current medications include: Glipizide XL 10 mg daily, Trulicity 0.75 mg weekly, Basaglar 24 units BID, metformin XR 1000 mg daily. She as missed a few doses of her morning meds on occasion.   She is checking her blood glucose a few times monthly and is getting readings of mid 200's, some low 300's.   Last A1C: 8.8 in May 2023 Last Eye Exam: UTD Last Foot Exam: UTD Pneumonia Vaccination: 2020 Urine Microalbumin: UTD Statin: None.  Dietary changes since last visit: She is no longer drinking sweet tea. She has increased protein in the AM. Fast food for dinner mostly.    Exercise: Active at work.   2) Hypothyroidism: Currently managed on levothyroxine 50 mcg daily.   She is taking levothyroxine  every morning on an empty stomach with water only.   No food or other medications for 30 minutes.   No heartburn medication, iron pills, calcium, vitamin D, or magnesium pills within four hours of taking levothyroxine.   3) Vaginal Itching: Also with symptoms of vaginal itching, dysuria, urinary frequency which began about one month ago. She's completed two complete doses of Monistat with temporary improvement.   She is also due for pap smear, last pap smear was in September 2020.  4) GAD: Currently managed on Zoloft 25 mg. Over the last few months she's noticed increased anxiety, has had 2 panic attacks last month. She felt improvement on Zoloft initially, feels like she could use a dose adjustment. She is also interested in another treatment to use just for panic attacks.  5)  Menorrhagia/Headaches: Since January 2023 she's noticed a gradual heavier menstrual cycle with a headache before and after menses. She denies breakthrough vaginal bleeding and breakthrough headaches. Sometimes Tylenol and Ibuprofen aren't enough for the headache, has begun to take Excedrin Migraine.   Review of Systems  Respiratory:  Negative for shortness of breath.   Cardiovascular:  Negative for chest pain.  Genitourinary:  Positive for dysuria and frequency.       Vaginal itching   Neurological:  Negative for numbness.         Past Medical History:  Diagnosis Date   Diabetes mellitus without complication (HCC)    Family history of breast cancer    9/20 cancer genetic testing letter sent   History of cesarean section complicating pregnancy 11/15/2018   History of preterm delivery 12/05/2018   Recommend serial cervical length screening starting at 16 weeks Refer for possible cerclage if cervical length < 2.5 cm   Supervision of high risk pregnancy in first trimester 11/15/2018   [facility] P[facility]atal Labs Dating  Blood type: B/Positive/-- (09/04 1216)  Genetic Screen 1 Screen:    AFP:     Quad:     NIPS: Antibody:Negative (09/04 1216) Anatomic [place]  Rubella: 5.05 (09/04 1216) Varicella: @VZVIGG @ GTT Early:               Third trimester:  RPR: Non Reactive (09/04 1216)  Rhogam  HBsAg: Negative (09/04 1216)  TDaP vaccine  Flu Shot: HIV: Non Reactive (   Thyroid disease    Type 2 diabetes mellitus with hyperosmolarity without coma, with long-term current use of insulin ([place]) 08/03/2017    Social History   Socioeconomic History   Marital status: Married    Spouse name: Ryheam Proofreader of children: Not on file   Years of education: Not on file   Highest education level: Not on file  Occupational History   Occupation: EC Teacher  Tobacco Use   Smoking status: Never   Smokeless tobacco: Never  Vaping Use   Vaping Use: Never used  Substance and  Sexual Activity   Alcohol use: No   Drug use: No   Sexual activity: Yes    Partners: Male    Birth control/protection: None  Other Topics Concern   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   Enjoys hiking, spending time with family, concerts   Social Determinants of Health   Financial Resource Strain: Not on file  Food Insecurity: Not on file  Transportation Needs: Not on file  Physical Activity: Not on file  Stress: Not on file  Social Connections: Not on file  Intimate Partner Violence: Not on file    Past Surgical History:  Procedure Laterality Date   CESAREAN SECTION      Family History  Problem Relation Age of Onset   Diabetes Mother    Hypertension Mother    Hypothyroidism Mother    Diabetes Father    Kidney disease Maternal Grandmother    Leukemia Maternal Grandfather    Diabetes Paternal Grandmother    Breast cancer Maternal Aunt 57       has contact    No Known Allergies  Current Outpatient Medications on File Prior to Visit  Medication Sig Dispense Refill   Continuous Blood Gluc Sensor (FREESTYLE LIBRE 3 SENSOR) MISC Place 1 sensor on the skin every 14 days. Use to check glucose continuously 2 each 5   Dulaglutide (TRULICITY) 2.42 PN/3.6RW SOPN Inject 0.75 mg into the skin once a week. for diabetes. Office visit required for further refills. 2 mL 0   glipiZIDE (GLUCOTROL XL) 10 MG 24 hr tablet TAKE 1 TABLET BY MOUTH DAILY WITH BREAKFAST FOR DIABETES 90 tablet 0   glucose blood test strip Use as instructed 200 each 11   levothyroxine (SYNTHROID) 50 MCG tablet TAKE 1 TABLET BY MOUTH EVERY MORNING ON AN EMPTY STOMACH WITH WATER ONLY, NO FOOD OR OTHER MEDICATIONS FOR 30 MINUTES 90 tablet 3   metFORMIN (GLUCOPHAGE-XR) 500 MG 24 hr tablet Take 2 tablets (1,000 mg total) by mouth daily with breakfast. For diabetes. 180 tablet 1   No current facility-administered medications on file prior to visit.    BP  138/80   Pulse 76   Temp 98.4 F (36.9 C) (Temporal)   Ht 5\' 2"  (1.575 m)   Wt 265 lb (120.2 kg)   LMP 12/05/2021 (Exact Date)   SpO2 97%   BMI 48.47 kg/m  Objective:   Physical Exam Exam conducted with a chaperone present.  Cardiovascular:     Rate and Rhythm: Normal rate and regular rhythm.  Pulmonary:     Effort: Pulmonary effort is normal.     Breath sounds: Normal breath sounds.  Genitourinary:    Labia:        Right: No tenderness or lesion.        Left:  No tenderness or lesion.      Vagina: Normal.     Cervix: Normal.     Uterus: Normal.   Musculoskeletal:     Cervical back: Neck supple.  Skin:    General: Skin is warm and dry.           Assessment & Plan:   Problem List Items Addressed This Visit       Endocrine   Uncontrolled type 2 diabetes mellitus with hyperglycemia (HCC) - Primary    Uncontrolled and worse with A1c of 11.1 today.  This raises red flags as she has been improving her diet and has been more compliant to her medication regimen. Labs pending today to check for new onset of type 1 diabetes.  Continue metformin XR 1000 mg daily, cannot increase the dose given history of GI upset. Continue glipizide XL 10 mg daily. Increase Trulicity to 1.5 mg weekly. She isn't sure if she has Ozempic or Trulicity at home, she will update [place] as soon as she gets home today. Continue Basaglar 24 units in the morning, increase Basaglar to 30 units in the evening.  She has a freestyle libre continuous glucose monitor and will start using it immediately. Follow-up in 1 month.       Relevant Medications   Insulin Glargine (BASAGLAR KWIKPEN) 100 UNIT/ML   Other Relevant Orders   C-peptide   Glutamic acid decarboxylase auto abs   Hypothyroidism    She is taking levothyroxine correctly.  Continue levothyroxine 50 mcg daily. Repeat TSH pending.      Relevant Orders   TSH   Comprehensive metabolic panel   CBC     Genitourinary   Vaginal itching     Likely secondary to glucose urea, especially given urinalysis result and A1c of 11.1. UA otherwise negative.  Wet prep collected and pending. Await results.      Relevant Orders   WET PREP BY MOLECULAR PROBE     Other   GAD (generalized anxiety disorder)    Deteriorated.  Increase Zoloft to 50 mg daily. Add hydroxyzine 10 mg as needed for panic attacks.  Update in 1 month.      Relevant Medications   hydrOXYzine (ATARAX) 10 MG tablet   sertraline (ZOLOFT) 50 MG tablet   Menorrhagia with regular cycle    Offered to try birth control, she did well on NuvaRing in the past. She will think about this and update next month.  Do suspect the headaches are hormonal, especially given the cyclical pattern that is associated with menses.      Other Visit Diagnoses     Screening for cervical cancer       Relevant Orders   Cytology - PAP   Urinary frequency       Relevant Orders   POCT Urinalysis Dipstick (Automated) (Completed)   Type 2 diabetes mellitus with hyperosmolarity without coma, with long-term current use of insulin (HCC)       Relevant Medications   Insulin Glargine (BASAGLAR KWIKPEN) 100 UNIT/ML          Doreene Nest, NP

## 2021-12-14 NOTE — Assessment & Plan Note (Signed)
Likely secondary to glucose urea, especially given urinalysis result and A1c of 11.1. UA otherwise negative.  Wet prep collected and pending. Await results.

## 2021-12-14 NOTE — Assessment & Plan Note (Signed)
Offered to try birth control, she did well on NuvaRing in the past. She will think about this and update next month.  Do suspect the headaches are hormonal, especially given the cyclical pattern that is associated with menses.

## 2021-12-14 NOTE — Assessment & Plan Note (Signed)
She is taking levothyroxine correctly.  Continue levothyroxine 50 mcg daily. Repeat TSH pending. 

## 2021-12-14 NOTE — Assessment & Plan Note (Signed)
Uncontrolled and worse with A1c of 11.1 today.  This raises red flags as she has been improving her diet and has been more compliant to her medication regimen. Labs pending today to check for new onset of type 1 diabetes.  Continue metformin XR 1000 mg daily, cannot increase the dose given history of GI upset. Continue glipizide XL 10 mg daily. Increase Trulicity to 1.5 mg weekly. She isn't sure if she has Ozempic or Trulicity at home, she will update [place] as soon as she gets home today. Continue Basaglar 24 units in the morning, increase Basaglar to 30 units in the evening.  She has a freestyle libre continuous glucose monitor and will start using it immediately. Follow-up in 1 month.

## 2021-12-14 NOTE — Patient Instructions (Signed)
Stop by the lab prior to leaving today. I will notify you of your results once received.   Continue Basaglar insulin 24 units in the morning for diabetes.  We increased your Basaglar insulin to 30 units in the evening.  Please notify me if you have Trulicity or Ozempic at home for diabetes.  Continue glipizide and metformin.  Start checking your blood sugars with the continuous glucose monitor.  Schedule follow-up visit in 1 month for diabetes check.  It was a pleasure to see you today!

## 2021-12-14 NOTE — Assessment & Plan Note (Signed)
Deteriorated.  Increase Zoloft to 50 mg daily. Add hydroxyzine 10 mg as needed for panic attacks.  Update in 1 month.

## 2021-12-15 LAB — WET PREP BY MOLECULAR PROBE
Candida species: NOT DETECTED
Gardnerella vaginalis: NOT DETECTED
MICRO NUMBER:: 14007082
SPECIMEN QUALITY:: ADEQUATE
Trichomonas vaginosis: NOT DETECTED

## 2021-12-16 LAB — C-PEPTIDE: C-Peptide: 3.1 ng/mL (ref 0.80–3.85)

## 2021-12-16 LAB — GLUTAMIC ACID DECARBOXYLASE AUTO ABS: Glutamic Acid Decarb Ab: <5 IU/mL (ref <5)

## 2021-12-17 ENCOUNTER — Other Ambulatory Visit: Payer: Self-pay | Admitting: Primary Care

## 2021-12-17 DIAGNOSIS — F411 Generalized anxiety disorder: Secondary | ICD-10-CM

## 2021-12-19 LAB — CYTOLOGY - PAP
Adequacy: ABSENT
Chlamydia: NEGATIVE
Comment: NEGATIVE
Comment: NEGATIVE
Comment: NEGATIVE
Comment: NORMAL
Diagnosis: NEGATIVE
High risk HPV: NEGATIVE
Neisseria Gonorrhea: NEGATIVE
Trichomonas: NEGATIVE

## 2021-12-23 ENCOUNTER — Other Ambulatory Visit: Payer: Self-pay | Admitting: Primary Care

## 2021-12-23 DIAGNOSIS — Z794 Long term (current) use of insulin: Secondary | ICD-10-CM

## 2022-01-17 ENCOUNTER — Other Ambulatory Visit: Payer: Self-pay | Admitting: Primary Care

## 2022-01-17 ENCOUNTER — Ambulatory Visit: Payer: BC Managed Care – PPO | Admitting: Primary Care

## 2022-01-17 DIAGNOSIS — E1165 Type 2 diabetes mellitus with hyperglycemia: Secondary | ICD-10-CM

## 2022-01-17 DIAGNOSIS — Z6841 Body Mass Index (BMI) 40.0 and over, adult: Secondary | ICD-10-CM

## 2022-01-18 NOTE — Telephone Encounter (Signed)
Called and left voicemail for patient to reschedule appointment.

## 2022-01-18 NOTE — Telephone Encounter (Signed)
Patient missed her appointment with me this week. I recommend she reschedule. See if you can get her to do so.

## 2022-01-26 ENCOUNTER — Emergency Department
Admission: EM | Admit: 2022-01-26 | Discharge: 2022-01-26 | Disposition: A | Discharge status: Home / Self Care | Payer: BC Managed Care – PPO | Attending: Emergency Medicine | Admitting: Emergency Medicine

## 2022-01-26 ENCOUNTER — Other Ambulatory Visit: Payer: Self-pay

## 2022-01-26 DIAGNOSIS — Z7984 Long term (current) use of oral hypoglycemic drugs: Secondary | ICD-10-CM | POA: Diagnosis not present

## 2022-01-26 DIAGNOSIS — E119 Type 2 diabetes mellitus without complications: Secondary | ICD-10-CM | POA: Insufficient documentation

## 2022-01-26 DIAGNOSIS — E876 Hypokalemia: Secondary | ICD-10-CM | POA: Insufficient documentation

## 2022-01-26 DIAGNOSIS — Z794 Long term (current) use of insulin: Secondary | ICD-10-CM | POA: Insufficient documentation

## 2022-01-26 DIAGNOSIS — Z79899 Other long term (current) drug therapy: Secondary | ICD-10-CM | POA: Insufficient documentation

## 2022-01-26 DIAGNOSIS — R252 Cramp and spasm: Secondary | ICD-10-CM | POA: Insufficient documentation

## 2022-01-26 DIAGNOSIS — E039 Hypothyroidism, unspecified: Secondary | ICD-10-CM | POA: Diagnosis not present

## 2022-01-26 LAB — BASIC METABOLIC PANEL
Anion gap: 9 (ref 5–15)
BUN: 12 mg/dL (ref 6–20)
CO2: 22 mmol/L (ref 22–32)
Calcium: 8.3 mg/dL — ABNORMAL LOW (ref 8.9–10.3)
Chloride: 101 mmol/L (ref 98–111)
Creatinine, Ser: 0.94 mg/dL (ref 0.44–1.00)
GFR, Estimated: >60 mL/min (ref >60)
Glucose, Bld: 225 mg/dL — ABNORMAL HIGH (ref 70–99)
Potassium: 3.3 mmol/L — ABNORMAL LOW (ref 3.5–5.1)
Sodium: 132 mmol/L — ABNORMAL LOW (ref 135–145)

## 2022-01-26 LAB — CBC
HCT: 35.2 % — ABNORMAL LOW (ref 36.0–46.0)
Hemoglobin: 11.5 g/dL — ABNORMAL LOW (ref 12.0–15.0)
MCH: 25.8 pg — ABNORMAL LOW (ref 26.0–34.0)
MCHC: 32.7 g/dL (ref 30.0–36.0)
MCV: 78.9 fL — ABNORMAL LOW (ref 80.0–100.0)
Platelets: 333 10*3/uL (ref 150–400)
RBC: 4.46 MIL/uL (ref 3.87–5.11)
RDW: 13.6 % (ref 11.5–15.5)
WBC: 11 10*3/uL — ABNORMAL HIGH (ref 4.0–10.5)
nRBC: 0 % (ref 0.0–0.2)

## 2022-01-26 LAB — CK: Total CK: 146 U/L (ref 38–234)

## 2022-01-26 LAB — POC URINE PREG, ED: Preg Test, Ur: NEGATIVE

## 2022-01-26 LAB — MAGNESIUM: Magnesium: 1.8 mg/dL (ref 1.7–2.4)

## 2022-01-26 MED ORDER — POTASSIUM CHLORIDE CRYS ER 20 MEQ PO TBCR
40.0000 meq | EXTENDED_RELEASE_TABLET | Freq: Once | ORAL | Status: AC
  Administered 2022-01-26: 40 meq via ORAL
  Filled 2022-01-26: qty 2

## 2022-01-26 NOTE — ED Triage Notes (Signed)
Pt comes from home via [facility] c/o leg cramps. Pt having bilateral leg cramping that started about 2 hrs ago. Pt states pain woke her out of sleep.

## 2022-01-26 NOTE — Discharge Instructions (Signed)
You may alternate Tylenol 1000 mg every 6 hours as needed for pain, fever and Ibuprofen 800 mg every 6-8 hours as needed for pain, fever.  Please take Ibuprofen with food.  Do not take more than 4000 mg of Tylenol (acetaminophen) in a 24 hour period. ° °

## 2022-01-26 NOTE — ED Provider Notes (Signed)
[facility] Provider Note    Event Date/Time   First MD Initiated Contact with Patient 01/26/22 0140     (approximate)   History   Leg Pain   HPI  Katie [name] is a 31 y.o. female with history of diabetes, hypothyroidism who presents to the emergency department with bilateral leg cramps in her feet and calves tonight.  States she took a liquid IV prior to arrival and now symptoms have resolved.  Brought in by EMS but was not given any pain medication in route.  Denies chest pain or shortness of breath.  No recent vomiting, diarrhea.  Not on a diuretic.  No fever.  No injury to her legs.  Increased physical exertion.  No history of PE, DVT, exogenous estrogen use, recent fractures, surgery, trauma, hospitalization, prolonged travel or other immobilization. No lower extremity swelling or pain. No calf tenderness.     History provided by patient and EMS.    Past Medical History:  Diagnosis Date   Diabetes mellitus without complication (HCC)    Family history of breast cancer    9/20 cancer genetic testing letter sent   History of cesarean section complicating pregnancy 11/15/2018   History of preterm delivery 12/05/2018   Recommend serial cervical length screening starting at 16 weeks Refer for possible cerclage if cervical length < 2.5 cm   Supervision of high risk pregnancy in first trimester 11/15/2018   [facility] Prenatal Labs Dating  Blood type: B/Positive/-- (09/04 1216)  Genetic Screen 1 Screen:    AFP:     Quad:     NIPS: Antibody:Negative (09/04 1216) Anatomic [place]  Rubella: 5.05 (09/04 1216) Varicella: @VZVIGG @ GTT Early:               Third trimester:  RPR: Non Reactive (09/04 1216)  Rhogam  HBsAg: Negative (09/04 1216)  TDaP vaccine                       Flu Shot: HIV: Non Reactive (   Thyroid disease    Type 2 diabetes mellitus with hyperosmolarity without coma, with long-term current use of insulin (HCC) 08/03/2017    Past Surgical History:   Procedure Laterality Date   CESAREAN SECTION      MEDICATIONS:  Prior to Admission medications   Medication Sig Start Date End Date Taking? Authorizing Provider  Continuous Blood Gluc Sensor (FREESTYLE LIBRE 3 SENSOR) MISC Place 1 sensor on the skin every 14 days. Use to check glucose continuously 11/30/21   12/02/21, NP  Dulaglutide (TRULICITY) 0.75 MG/0.5ML SOPN INJECT 0.75MG  INTO THE SKIN ONCE A WEEK FOR DIABETES 01/18/22   13/8/23, NP  glipiZIDE (GLUCOTROL XL) 10 MG 24 hr tablet TAKE 1 TABLET BY MOUTH DAILY WITH BREAKFAST FOR DIABETES 11/30/21   12/02/21, NP  glucose blood test strip Use as instructed 01/28/21   01/30/21, NP  hydrOXYzine (ATARAX) 10 MG tablet Take 1 tablet (10 mg total) by mouth daily as needed (panic attacks). 12/14/21   02/13/22, NP  Insulin Glargine [facility]) 100 UNIT/ML Inject 24 units into the skin in the morning and 30 units into the skin in the evening for diabetes. 12/14/21   02/13/22, NP  levothyroxine (SYNTHROID) 50 MCG tablet TAKE 1 TABLET BY MOUTH EVERY MORNING ON AN EMPTY STOMACH WITH WATER ONLY, NO FOOD OR OTHER MEDICATIONS FOR 30 MINUTES 02/08/21   02/10/21,  NP  metFORMIN (GLUCOPHAGE-XR) 500 MG 24 hr tablet Take 2 tablets (1,000 mg total) by mouth daily with breakfast. For diabetes. 07/20/21   Doreene Nest, NP  sertraline (ZOLOFT) 50 MG tablet Take 1 tablet (50 mg total) by mouth daily. For anxiety 12/14/21   Doreene Nest, NP    Physical Exam   Triage Vital Signs: ED Triage Vitals  Enc Vitals Group     BP 01/26/22 0026 (!) 132/91     Pulse Rate 01/26/22 0026 93     Resp 01/26/22 0026 17     Temp 01/26/22 0026 98.2 F (36.8 C)     Temp Source 01/26/22 0026 Oral     SpO2 01/26/22 0026 97 %     Weight 01/26/22 0029 265 lb (120.2 kg)     Height 01/26/22 0029 5\' 2"  (1.575 m)     Head Circumference --      Peak Flow --      Pain Score 01/26/22 0029 5     Pain Loc --       Pain Edu? --      Excl. in GC? --     Most recent vital signs: Vitals:   01/26/22 0200 01/26/22 0204  BP: 124/78   Pulse: 76 85  Resp:  18  Temp:  98.2 F (36.8 C)  SpO2: 99% 100%    CONSTITUTIONAL: Alert and oriented and responds appropriately to questions. Well-appearing; well-nourished HEAD: Normocephalic, atraumatic EYES: Conjunctivae clear, pupils appear equal, sclera nonicteric ENT: normal nose; moist mucous membranes NECK: Supple, normal ROM CARD: RRR; S1 and S2 appreciated; no murmurs, no clicks, no rubs, no gallops RESP: Normal chest excursion without splinting or tachypnea; breath sounds clear and equal bilaterally; no wheezes, no rhonchi, no rales, no hypoxia or respiratory distress, speaking full sentences ABD/GI: Normal bowel sounds; non-distended; soft, non-tender, no rebound, no guarding, no peritoneal signs BACK: The back appears normal EXT: Normal ROM in all joints; no deformity noted, no edema; no cyanosis, extremities warm well perfused, no calf tenderness or calf swelling, compartment soft, no joint effusion, no increased warmth or redness SKIN: Normal color for age and race; warm; no rash on exposed skin NEURO: Moves all extremities equally, normal speech PSYCH: The patient's mood and manner are appropriate.   ED Results / Procedures / Treatments   LABS: (all labs ordered are listed, but only abnormal results are displayed) Labs Reviewed  CBC - Abnormal; Notable for the following components:      Result Value   WBC 11.0 (*)    Hemoglobin 11.5 (*)    HCT 35.2 (*)    MCV 78.9 (*)    MCH 25.8 (*)    All other components within normal limits  BASIC METABOLIC PANEL - Abnormal; Notable for the following components:   Sodium 132 (*)    Potassium 3.3 (*)    Glucose, Bld 225 (*)    Calcium 8.3 (*)    All other components within normal limits  MAGNESIUM  CK  POC URINE PREG, ED     EKG:   RADIOLOGY: My personal review and interpretation of  imaging:    I have personally reviewed all radiology reports.   No results found.   PROCEDURES:  Critical Care performed: No     Procedures    IMPRESSION / MDM / ASSESSMENT AND PLAN / ED COURSE  I reviewed the triage vital signs and the nursing notes.    Patient here with bilateral leg cramps  that have resolved.   DIFFERENTIAL DIAGNOSIS (includes but not limited to):   Electrolyte derangement, rhabdomyolysis, muscle spasms, muscle cramps, doubt DVT, no signs of cellulitis, gout, septic arthritis, compartment syndrome   Patient's presentation is most consistent with acute presentation with potential threat to life or bodily function.   PLAN: Blood work initiated from triage.  Slight leukocytosis.  Stable hemoglobin.  Potassium of 3.3 but otherwise normal electrolytes and normal CK.  Will give oral potassium replacement.  Pregnancy test negative.  Extremities warm and well-perfused.  Doubt arterial obstruction.  She has no risk factors for DVT and her Wells criteria is negative.  I do not feel she needs venous Dopplers especially given symptoms have resolved.  No signs of cellulitis, compartment syndrome, septic arthritis, gout.  Suspect that this is normal muscle cramps and could have been from slightly low potassium level and potassium could have increased slightly from home given she did take a liquid IV prior to getting here.  Recommended increase water intake, healthy diet, stretching, Tylenol Motrin as needed.   MEDICATIONS GIVEN IN ED: Medications  potassium chloride SA (KLOR-CON M) CR tablet 40 mEq (40 mEq Oral Given 01/26/22 0203)     ED COURSE:  At this time, I do not feel there is any life-threatening condition present. I reviewed all nursing notes, vitals, pertinent previous records.  All lab and urine results, EKGs, imaging ordered have been independently reviewed and interpreted by myself.  I reviewed all available radiology reports from any imaging ordered this  visit.  Based on my assessment, I feel the patient is safe to be discharged home without further emergent workup and can continue workup as an outpatient as needed. Discussed all findings, treatment plan as well as usual and customary return precautions.  They verbalize understanding and are comfortable with this plan.  Outpatient follow-up has been provided as needed.  All questions have been answered.    CONSULTS:  none   OUTSIDE RECORDS REVIEWED: Reviewed patient's last surgery note on 06/11/2019.       FINAL CLINICAL IMPRESSION(S) / ED DIAGNOSES   Final diagnoses:  Bilateral leg cramps  Hypokalemia     Rx / DC Orders   ED Discharge Orders     None        Note:  This document was prepared using Dragon voice recognition software and may include unintentional dictation errors.   Calaya Gildner, Layla Maw, DO 01/26/22 [phone]

## 2022-01-31 ENCOUNTER — Ambulatory Visit: Payer: BC Managed Care – PPO | Admitting: Primary Care

## 2022-01-31 ENCOUNTER — Encounter: Payer: Self-pay | Admitting: Primary Care

## 2022-01-31 VITALS — BP 118/68 | HR 67 | Temp 98.4°F | Ht 62.0 in | Wt 268.0 lb

## 2022-01-31 DIAGNOSIS — E876 Hypokalemia: Secondary | ICD-10-CM | POA: Diagnosis not present

## 2022-01-31 DIAGNOSIS — R252 Cramp and spasm: Secondary | ICD-10-CM | POA: Diagnosis not present

## 2022-01-31 DIAGNOSIS — E1165 Type 2 diabetes mellitus with hyperglycemia: Secondary | ICD-10-CM | POA: Diagnosis not present

## 2022-01-31 LAB — BASIC METABOLIC PANEL
BUN: 10 mg/dL (ref 6–23)
CO2: 30 mEq/L (ref 19–32)
Calcium: 8.5 mg/dL (ref 8.4–10.5)
Chloride: 100 mEq/L (ref 96–112)
Creatinine, Ser: 0.74 mg/dL (ref 0.40–1.20)
GFR: 108.32 mL/min (ref >60.00)
Glucose, Bld: 282 mg/dL — ABNORMAL HIGH (ref 70–99)
Potassium: 4.2 mEq/L (ref 3.5–5.1)
Sodium: 135 mEq/L (ref 135–145)

## 2022-01-31 NOTE — Assessment & Plan Note (Signed)
Improving, not at goal. Commended her on dietary changes, encouraged to continue.  We will check with her pharmacy regarding the Trulicity prescription.  We will have her resume at 0.75 mg x 1 month, then increase to 1.5 mg weekly thereafter.  Continue metformin XR 1000 mg daily, glipizide XL 10 mg daily, Basaglar 24 units in a.m. and 30 units in p.m.  Foot exam today.  Follow-up in early January 2024.

## 2022-01-31 NOTE — Progress Notes (Signed)
Subjective:    Patient ID: Katie [name], female    DOB: 1990/04/17, 31 y.o.   MRN: [id number]  HPI  Katie [name] is a very pleasant 31 y.o. female with a history of uncontrolled type 2 diabetes, paresthesias, GAD, menorrhagia who presents today for follow up of diabetes and to discuss myalgia.  1) Type 2 Diabetes:  Current medications include: Metformin XR 1000 mg daily, Basaglar 24 units in AM and 30 units in PM, Glipizide XL 10 mg daily, Trulicity 0.75 mg daily.   She has not had Trulicity in 1 month.   She is checking her blood glucose continuously with [facility] and is getting readings of mid 100's to mid 200's  Last A1C: 8.8 in May 2023, 11.1 in October 2023 Last Eye Exam: UTD Last Foot Exam: Due Pneumonia Vaccination: 2020 Urine Microalbumin: UTD Statin: None.   Dietary changes since last visit: She is working to cut back on fast food, making healthier choices with fast food, she has not had a regular soda in weeks, she has increased water intake.    Exercise: None.   2) Myalgia:   She presented to [facility] ED via EMS on 01/26/22 for bilateral lower extremity cramping to feet and calves that began earlier that evening.   During her ED visit she underwent labs which revealed mild leukocytosis, hypokalemia, normal CK, mild anemia - stable. Other labs negative including urine pregnancy test. She was provided with potassium chloride 40 mEq orally and was discharged home later that morning.   Since her ED visit she feel better. She continues to notice some soreness to her right calf. Her pain is worse when standing from a seated position. She feels better after walking.   She began an Aswaganda gummy three days prior to symptom onset, and she increased her dose of Zoloft to 50 mg one day prior to symptom onset.    Review of Systems  Respiratory:  Negative for shortness of breath.   Cardiovascular:  Negative for chest pain.  Musculoskeletal:  Positive for myalgias.   Neurological:  Negative for numbness.         Past Medical History:  Diagnosis Date   Diabetes mellitus without complication (HCC)    Family history of breast cancer    9/20 cancer genetic testing letter sent   History of cesarean section complicating pregnancy 11/15/2018   History of preterm delivery 12/05/2018   Recommend serial cervical length screening starting at 16 weeks Refer for possible cerclage if cervical length < 2.5 cm   Supervision of high risk pregnancy in first trimester 11/15/2018   Clinic [facility] Prenatal Labs Dating  Blood type: B/Positive/-- (09/04 1216)  Genetic Screen 1 Screen:    AFP:     Quad:     NIPS: Antibody:Negative (09/04 1216) Anatomic [place]  Rubella: 5.05 (09/04 1216) Varicella: @VZVIGG @ GTT Early:               Third trimester:  RPR: Non Reactive (09/04 1216)  Rhogam  HBsAg: Negative (09/04 1216)  TDaP vaccine                       Flu Shot: HIV: Non Reactive (   Thyroid disease    Type 2 diabetes mellitus with hyperosmolarity without coma, with long-term current use of insulin (HCC) 08/03/2017    Social History   Socioeconomic History   Marital status: Divorced    Spouse name: Ryheam 08/05/2017 of  children: Not on file   Years of education: Not on file   Highest education level: Not on file  Occupational History   Occupation: EC Teacher  Tobacco Use   Smoking status: Never   Smokeless tobacco: Never  Vaping Use   Vaping Use: Never used  Substance and Sexual Activity   Alcohol use: No   Drug use: No   Sexual activity: Yes    Partners: Male    Birth control/protection: None  Other Topics Concern   Not on file  Social History Narrative   Works as a Runner, 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   Enjoys hiking, spending time with family, concerts   Social Determinants of Health   Financial Resource Strain: Not on file  Food Insecurity: Not on file  Transportation Needs: Not on file  Physical Activity: Not on file  Stress: Not  on file  Social Connections: Not on file  Intimate Partner Violence: Not on file    Past Surgical History:  Procedure Laterality Date   CESAREAN SECTION      Family History  Problem Relation Age of Onset   Diabetes Mother    Hypertension Mother    Hypothyroidism Mother    Diabetes Father    Kidney disease Maternal Grandmother    Leukemia Maternal Grandfather    Diabetes Paternal Grandmother    Breast cancer Maternal Aunt 68       has contact    No Known Allergies  Current Outpatient Medications on File Prior to Visit  Medication Sig Dispense Refill   Continuous Blood Gluc Sensor (FREESTYLE LIBRE 3 SENSOR) MISC Place 1 sensor on the skin every 14 days. Use to check glucose continuously 2 each 5   Dulaglutide (TRULICITY) 0.75 MG/0.5ML SOPN INJECT 0.75MG  INTO THE SKIN ONCE A WEEK FOR DIABETES 6 mL 0   glipiZIDE (GLUCOTROL XL) 10 MG 24 hr tablet TAKE 1 TABLET BY MOUTH DAILY WITH BREAKFAST FOR DIABETES 90 tablet 0   glucose blood test strip Use as instructed 200 each 11   hydrOXYzine (ATARAX) 10 MG tablet Take 1 tablet (10 mg total) by mouth daily as needed (panic attacks). 30 tablet 0   Insulin Glargine (BASAGLAR KWIKPEN) 100 UNIT/ML Inject 24 units into the skin in the morning and 30 units into the skin in the evening for diabetes. 30 mL 0   levothyroxine (SYNTHROID) 50 MCG tablet TAKE 1 TABLET BY MOUTH EVERY MORNING ON AN EMPTY STOMACH WITH WATER ONLY, NO FOOD OR OTHER MEDICATIONS FOR 30 MINUTES 90 tablet 3   metFORMIN (GLUCOPHAGE-XR) 500 MG 24 hr tablet Take 2 tablets (1,000 mg total) by mouth daily with breakfast. For diabetes. 180 tablet 1   sertraline (ZOLOFT) 50 MG tablet Take 1 tablet (50 mg total) by mouth daily. For anxiety 90 tablet 1   No current facility-administered medications on file prior to visit.    BP 118/68   Pulse 67   Temp 98.4 F (36.9 C) (Temporal)   Ht 5\' 2"  (1.575 m)   Wt 268 lb (121.6 kg)   SpO2 98%   BMI 49.02 kg/m  Objective:   Physical  Exam Cardiovascular:     Rate and Rhythm: Normal rate and regular rhythm.  Pulmonary:     Effort: Pulmonary effort is normal.     Breath sounds: Normal breath sounds.  Musculoskeletal:     Cervical back: Neck supple.     Right lower leg: Tenderness present. No swelling. No edema.  Left lower leg: No swelling or tenderness. No edema.       Legs:     Comments: Mild tenderness to right calf upon palpation.  Skin:    General: Skin is warm and dry.           Assessment & Plan:   Problem List Items Addressed This Visit       Endocrine   Uncontrolled type 2 diabetes mellitus with hyperglycemia ([place])    Improving, not at goal. Commended her on dietary changes, encouraged to continue.  We will check with her pharmacy regarding the Trulicity prescription.  We will have her resume at 0.75 mg x 1 month, then increase to 1.5 mg weekly thereafter.  Continue metformin XR 1000 mg daily, glipizide XL 10 mg daily, Basaglar 24 units in a.m. and 30 units in p.m.  Foot exam today.  Follow-up in early January 2024.        Other   Muscle cramping    Recent ED visit.  Reviewed ED notes and labs.  It is possible that her cramping could have been secondary to the dose increase of her Zoloft from 25 mg to 50 mg.  This is a potential side effect. Fortunately, it seems that she is doing better while taking Zoloft 50 mg daily.  Suspect hypokalemia was secondary to hyperglycemia.  We will check potassium level today.  Continue walking and massage. She will update if symptoms return.      Other Visit Diagnoses     Hypokalemia    -  Primary   Relevant Orders   Basic metabolic panel          Pleas Koch, NP

## 2022-01-31 NOTE — Patient Instructions (Signed)
Stop by the lab prior to leaving today. I will notify you of your results once received.   We will be in touch regarding your Trulicity prescription.  Please schedule an appointment for diabetes check in early January 2024.  It was a pleasure to see you today!

## 2022-01-31 NOTE — Assessment & Plan Note (Signed)
Recent ED visit.  Reviewed ED notes and labs.  It is possible that her cramping could have been secondary to the dose increase of her Zoloft from 25 mg to 50 mg.  This is a potential side effect. Fortunately, it seems that she is doing better while taking Zoloft 50 mg daily.  Suspect hypokalemia was secondary to hyperglycemia.  We will check potassium level today.  Continue walking and massage. She will update if symptoms return.

## 2022-02-25 ENCOUNTER — Other Ambulatory Visit: Payer: Self-pay | Admitting: Primary Care

## 2022-02-25 DIAGNOSIS — E119 Type 2 diabetes mellitus without complications: Secondary | ICD-10-CM

## 2022-03-01 ENCOUNTER — Encounter: Payer: Self-pay | Admitting: Primary Care

## 2022-03-01 ENCOUNTER — Other Ambulatory Visit (HOSPITAL_COMMUNITY)
Admission: RE | Admit: 2022-03-01 | Discharge: 2022-03-01 | Disposition: A | Discharge status: Home / Self Care | Payer: BC Managed Care – PPO | Source: Ambulatory Visit | Attending: Primary Care | Admitting: Primary Care

## 2022-03-01 ENCOUNTER — Ambulatory Visit: Payer: BC Managed Care – PPO | Admitting: Primary Care

## 2022-03-01 VITALS — BP 120/78 | HR 85 | Temp 98.5°F | Ht 62.0 in | Wt 264.0 lb

## 2022-03-01 DIAGNOSIS — N898 Other specified noninflammatory disorders of vagina: Secondary | ICD-10-CM | POA: Diagnosis present

## 2022-03-01 DIAGNOSIS — Z113 Encounter for screening for infections with a predominantly sexual mode of transmission: Secondary | ICD-10-CM | POA: Diagnosis not present

## 2022-03-01 DIAGNOSIS — E1165 Type 2 diabetes mellitus with hyperglycemia: Secondary | ICD-10-CM

## 2022-03-01 LAB — POC URINALSYSI DIPSTICK (AUTOMATED)
Bilirubin, UA: NEGATIVE
Blood, UA: NEGATIVE
Glucose, UA: POSITIVE — AB
Ketones, UA: NEGATIVE
Leukocytes, UA: NEGATIVE
Nitrite, UA: NEGATIVE
Protein, UA: NEGATIVE
Spec Grav, UA: 1.01 (ref 1.010–1.025)
Urobilinogen, UA: 0.2 E.U./dL
pH, UA: 5 (ref 5.0–8.0)

## 2022-03-01 LAB — GLUCOSE, POCT (MANUAL RESULT ENTRY): POC Glucose: 489 mg/dl — AB (ref 70–99)

## 2022-03-01 MED ORDER — FLUCONAZOLE 150 MG PO TABS: 150.0000 mg | ORAL_TABLET | Freq: Once | ORAL | 0 refills | Status: AC

## 2022-03-01 NOTE — Progress Notes (Signed)
Subjective:    Patient ID: Katie [name], female    DOB: Feb 22, 1991, 31 y.o.   MRN: [id number]  Vaginal Itching The patient's pertinent negatives include no vaginal discharge. Pertinent negatives include no abdominal pain, dysuria or hematuria.    Katie [name] is a very pleasant 31 y.o. female with a history of type 2 diabetes, GAD, hypothyroidism, menorrhagia who presents today for follow up of diabetes and to discuss vaginal itching.   1) Type 2 Diabetes: Current medications include: Basaglar 24 units in AM and 30 units in PM, Glipizide XL 10 mg daily, Trulicity 0.75 mg weekly.  She has not started Trulicity as it is too expensive. She plans to start in January 2024 when her [employer] account is reloaded. Greggory Keen was not covered by her insurance company.   She is checking her blood glucose 0 times daily. Her Dexcom came out two days after we inserted it in November 2023.   Last A1C: 11.1 in October 2023, glucose reading of 489 today. Last Eye Exam: UTD Last Foot Exam: UTD Pneumonia Vaccination: 2020 Urine Microalbumin: Due Statin: None.  Dietary changes since last visit: She has reduced her intake of sugary drinks and sweets, working on portion sizes, eating out less, making better choices if eating out.    Exercise: None.   2) Vaginal Itching: Symptom onset since September 2023. She is not checking her glucose readings.   She has done a few rounds of monistat which helps temporarily. She was treated with Amoxil in November 2023, noticed resolve in symptoms for a few days, then symptoms returned. Since then she's noticed flaking skin, rectal itching, and a deep vaginal itching.   She denies vaginal discharge, dysuria, urinary frequency.    Review of Systems  Cardiovascular:  Negative for chest pain.  Gastrointestinal:  Negative for abdominal pain.  Genitourinary:  Negative for dysuria, hematuria and vaginal discharge.       Vaginal itching         Past Medical  History:  Diagnosis Date   Diabetes mellitus without complication (HCC)    Family history of breast cancer    9/20 cancer genetic testing letter sent   History of cesarean section complicating pregnancy 11/15/2018   History of preterm delivery 12/05/2018   Recommend serial cervical length screening starting at 16 weeks Refer for possible cerclage if cervical length < 2.5 cm   Supervision of high risk pregnancy in first trimester 11/15/2018   [facility]inic [facility] Prenatal Labs Dating  Blood type: B/Positive/-- (09/04 1216)  Genetic Screen 1 Screen:    AFP:     Quad:     NIPS: Antibody:Negative (09/04 1216) Anatomic [place]  Rubella: 5.05 (09/04 1216) Varicella: @VZVIGG @ GTT Early:               Third trimester:  RPR: Non Reactive (09/04 1216)  Rhogam  HBsAg: Negative (09/04 1216)  TDaP vaccine                       Flu Shot: HIV: Non Reactive (   Thyroid disease    Type 2 diabetes mellitus with hyperosmolarity without coma, with long-term current use of insulin (HCC) 08/03/2017    Social History   Socioeconomic History   Marital status: Divorced    Spouse name: Ryheam 08/05/2017 of children: Not on file   Years of education: Not on file   Highest education level: Not on file  Occupational History   Occupation:  [employer] Teacher  Tobacco Use   Smoking status: Never   Smokeless tobacco: Never  Vaping Use   Vaping Use: Never used  Substance and Sexual Activity   Alcohol use: No   Drug use: No   Sexual activity: Yes    Partners: Male    Birth control/protection: None  Other Topics Concern   Not on file  Social History Narrative   Works as a Runner, 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   Enjoys hiking, spending time with family, concerts   Social Determinants of Health   Financial Resource Strain: Not on file  Food Insecurity: Not on file  Transportation Needs: Not on file  Physical Activity: Not on file  Stress: Not on file  Social Connections: Not on file  Intimate Partner  Violence: Not on file    Past Surgical History:  Procedure Laterality Date   CESAREAN SECTION      Family History  Problem Relation Age of Onset   Diabetes Mother    Hypertension Mother    Hypothyroidism Mother    Diabetes Father    Kidney disease Maternal Grandmother    Leukemia Maternal Grandfather    Diabetes Paternal Grandmother    Breast cancer Maternal Aunt 49       has contact    No Known Allergies  Current Outpatient Medications on File Prior to Visit  Medication Sig Dispense Refill   Continuous Blood Gluc Sensor (FREESTYLE LIBRE 3 SENSOR) MISC Place 1 sensor on the skin every 14 days. Use to check glucose continuously 2 each 5   glipiZIDE (GLUCOTROL XL) 10 MG 24 hr tablet TAKE 1 TABLET BY MOUTH DAILY WITH BREAKFAST FOR DIABETES 90 tablet 0   glucose blood test strip Use as instructed 200 each 11   hydrOXYzine (ATARAX) 10 MG tablet Take 1 tablet (10 mg total) by mouth daily as needed (panic attacks). 30 tablet 0   Insulin Glargine (BASAGLAR KWIKPEN) 100 UNIT/ML Inject 24 units into the skin in the morning and 30 units into the skin in the evening for diabetes. 30 mL 0   levothyroxine (SYNTHROID) 50 MCG tablet TAKE 1 TABLET BY MOUTH EVERY MORNING ON AN EMPTY STOMACH WITH WATER ONLY, NO FOOD OR OTHER MEDICATIONS FOR 30 MINUTES 90 tablet 3   metFORMIN (GLUCOPHAGE-XR) 500 MG 24 hr tablet Take 2 tablets (1,000 mg total) by mouth daily with breakfast. For diabetes. 180 tablet 1   sertraline (ZOLOFT) 50 MG tablet Take 1 tablet (50 mg total) by mouth daily. For anxiety 90 tablet 1   Dulaglutide (TRULICITY) 0.75 MG/0.5ML SOPN INJECT 0.75MG  INTO THE SKIN ONCE A WEEK FOR DIABETES (Patient not taking: Reported on 03/01/2022) 6 mL 0   No current facility-administered medications on file prior to visit.    BP 120/78   Pulse 85   Temp 98.5 F (36.9 C) (Temporal)   Ht 5\' 2"  (1.575 m)   Wt 264 lb (119.7 kg)   SpO2 99%   BMI 48.29 kg/m  Objective:   Physical  Exam Cardiovascular:     Rate and Rhythm: Normal rate and regular rhythm.  Pulmonary:     Effort: Pulmonary effort is normal.     Breath sounds: Normal breath sounds.  Genitourinary:    Labia:        Right: No rash, tenderness or lesion.        Left: No rash, tenderness or lesion.   Musculoskeletal:     Cervical back: Neck supple.  Skin:  General: Skin is warm and dry.           Assessment & Plan:   Problem List Items Addressed This Visit       Endocrine   Uncontrolled type 2 diabetes mellitus with hyperglycemia (HCC)    Remains uncontrolled.  Strongly advise she start Trulicity if possible. I have also asked if she will find out if Ozempic is more affordable.   Continue Basaglar 24 units in AM and 30 units in PM.  Continue Glipizide XL 10 mg daily.  Discussed the absolute need to start checking glucose readings. She has everything she needs.  Follow up in January as scheduled.         Genitourinary   Vaginal itching - Primary    Continued. 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 Long discussion today about the ABSOLUTE need to get blood sugars under control, start checking glucose readings, and to start treatment as prescribed. Glucose reading today of 489.  UA today with 3+ glucose, otherwise negative. Labs for candida, gardenella, trichomonas, gonorrhea, chlamydia ordered through urine and pending. Also checking HIV, Hep C, RPR.   Rx for Fluconazole 150 mg tablet provided today. Discussed this will be a temporary fix unless glucose levels are controlled.  Await results.         Relevant Medications   fluconazole (DIFLUCAN) 150 MG tablet   Other Relevant Orders   Urine cytology ancillary only   POCT glucose (manual entry) (Completed)   POCT Urinalysis Dipstick (Automated) (Completed)     Other   Screening for STD (sexually transmitted disease)   Relevant Orders   Herpes Simplex Virus 1 and 2 (IgG), with Reflex to HSV-2 Inhibition   HIV Antibody  (routine testing w rflx)   Hepatitis C antibody   RPR       Doreene Nest, NP

## 2022-03-01 NOTE — Assessment & Plan Note (Signed)
Continued. 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 Long discussion today about the ABSOLUTE need to get blood sugars under control, start checking glucose readings, and to start treatment as prescribed. Glucose reading today of 489.  UA today with 3+ glucose, otherwise negative. Labs for candida, gardenella, trichomonas, gonorrhea, chlamydia ordered through urine and pending. Also checking HIV, Hep C, RPR.   Rx for Fluconazole 150 mg tablet provided today. Discussed this will be a temporary fix unless glucose levels are controlled.  Await results.

## 2022-03-01 NOTE — Patient Instructions (Signed)
Stop by the lab prior to leaving today. I will notify you of your results once received.   Start checking blood sugars.  Find out about Trulicity cost and if Ozempic may be better.  You may take the fluconazole 150 mg tablet once.   It was a pleasure to see you today!

## 2022-03-01 NOTE — Assessment & Plan Note (Signed)
Remains uncontrolled.  Strongly advise she start Trulicity if possible. I have also asked if she will find out if Ozempic is more affordable.   Continue Basaglar 24 units in AM and 30 units in PM.  Continue Glipizide XL 10 mg daily.  Discussed the absolute need to start checking glucose readings. She has everything she needs.  Follow up in January as scheduled.

## 2022-03-02 ENCOUNTER — Other Ambulatory Visit: Payer: Self-pay | Admitting: Primary Care

## 2022-03-02 DIAGNOSIS — E1100 Type 2 diabetes mellitus with hyperosmolarity without nonketotic hyperglycemic-hyperosmolar coma (NKHHC): Secondary | ICD-10-CM

## 2022-03-02 LAB — HERPES SIMPLEX VIRUS 1 AND 2 (IGG),REFLEX HSV-2 INHIBITION
HAV 1 IGG,TYPE SPECIFIC AB: 13.1 index — ABNORMAL HIGH
HSV 2 IGG,TYPE SPECIFIC AB: <0.9 index

## 2022-03-02 LAB — URINE CYTOLOGY ANCILLARY ONLY
Bacterial Vaginitis-Urine: NEGATIVE
Candida Urine: POSITIVE — AB
Chlamydia: NEGATIVE
Comment: NEGATIVE
Comment: NEGATIVE
Comment: NORMAL
Neisseria Gonorrhea: NEGATIVE
Trichomonas: NEGATIVE

## 2022-03-02 LAB — HIV ANTIBODY (ROUTINE TESTING W REFLEX): HIV 1&2 Ab, 4th Generation: NONREACTIVE

## 2022-03-02 LAB — HEPATITIS C ANTIBODY: Hepatitis C Ab: NONREACTIVE

## 2022-03-02 LAB — RPR: RPR Ser Ql: NONREACTIVE

## 2022-03-15 ENCOUNTER — Ambulatory Visit: Payer: BC Managed Care – PPO | Admitting: Primary Care

## 2022-03-16 ENCOUNTER — Ambulatory Visit: Payer: BC Managed Care – PPO | Admitting: Primary Care

## 2022-04-22 ENCOUNTER — Other Ambulatory Visit: Payer: Self-pay | Admitting: Primary Care

## 2022-04-22 DIAGNOSIS — E039 Hypothyroidism, unspecified: Secondary | ICD-10-CM

## 2022-05-28 ENCOUNTER — Other Ambulatory Visit: Payer: Self-pay | Admitting: Primary Care

## 2022-05-28 DIAGNOSIS — E1165 Type 2 diabetes mellitus with hyperglycemia: Secondary | ICD-10-CM

## 2022-05-29 ENCOUNTER — Other Ambulatory Visit: Payer: Self-pay | Admitting: Primary Care

## 2022-05-29 DIAGNOSIS — E1100 Type 2 diabetes mellitus with hyperosmolarity without nonketotic hyperglycemic-hyperosmolar coma (NKHHC): Secondary | ICD-10-CM

## 2022-06-14 ENCOUNTER — Encounter: Payer: Self-pay | Admitting: Primary Care

## 2022-06-14 ENCOUNTER — Ambulatory Visit: Payer: BC Managed Care – PPO | Admitting: Primary Care

## 2022-06-14 ENCOUNTER — Other Ambulatory Visit: Payer: Self-pay | Admitting: Primary Care

## 2022-06-14 VITALS — BP 132/88 | HR 80 | Temp 98.6°F | Ht 62.0 in | Wt 265.0 lb

## 2022-06-14 DIAGNOSIS — N920 Excessive and frequent menstruation with regular cycle: Secondary | ICD-10-CM | POA: Diagnosis not present

## 2022-06-14 DIAGNOSIS — E1165 Type 2 diabetes mellitus with hyperglycemia: Secondary | ICD-10-CM

## 2022-06-14 DIAGNOSIS — Z6841 Body Mass Index (BMI) 40.0 and over, adult: Secondary | ICD-10-CM

## 2022-06-14 DIAGNOSIS — Z794 Long term (current) use of insulin: Secondary | ICD-10-CM

## 2022-06-14 DIAGNOSIS — N898 Other specified noninflammatory disorders of vagina: Secondary | ICD-10-CM

## 2022-06-14 LAB — MICROALBUMIN / CREATININE URINE RATIO
Creatinine,U: 111.7 mg/dL
Microalb Creat Ratio: 0.6 mg/g (ref 0.0–30.0)
Microalb, Ur: <0.7 mg/dL (ref 0.0–1.9)

## 2022-06-14 LAB — POCT GLYCOSYLATED HEMOGLOBIN (HGB A1C): Hemoglobin A1C: 11.5 % — AB (ref 4.0–5.6)

## 2022-06-14 MED ORDER — TRULICITY 1.5 MG/0.5ML ~~LOC~~ SOAJ: 1.5000 mg | SUBCUTANEOUS | 0 refills | Status: DC

## 2022-06-14 MED ORDER — ETONOGESTREL-ETHINYL ESTRADIOL 0.12-0.015 MG/24HR VA RING: VAGINAL_RING | VAGINAL | 3 refills | Status: DC

## 2022-06-14 MED ORDER — METFORMIN HCL ER 500 MG PO TB24: ORAL_TABLET | ORAL | 0 refills | Status: DC

## 2022-06-14 MED ORDER — FLUCONAZOLE 150 MG PO TABS: 150.0000 mg | ORAL_TABLET | Freq: Once | ORAL | 0 refills | Status: AC

## 2022-06-14 NOTE — Assessment & Plan Note (Signed)
Uncontrolled and worse with A1C today of 11.5.   Discussed potential barriers to uncontrolled glucose readings which appears to be her diet. Discussed to eliminate vending machine food and fast food for dinner. She will work on better meal prep at night.  Increase Trulicity to 1.5 mg weekly x 4 weeks, then increase to 3 mg thereafter. She will notify once she uses her last 1.5 mg pen. Continue Glipizide XL 10 mg daily. Continue Basaglar 24 units in AM and 30 units in PM.   Increase metformin XR to 500 mg in AM and continue XR 1000 mg in PM.   I've asked her to send glucose readings in 2 weeks. Follow up in 3 months.

## 2022-06-14 NOTE — Patient Instructions (Signed)
We increased your Trulicity to 1.5 mg weekly. Message me once you use your 4th pen so I can send in the 3 mg dose.  Increase metformin by taking 1 pill in the morning and 2 pills at night.  Continue insulin and Glipizide.  Send me blood sugar readings in 2 weeks via MyChart.  You will either be contacted via phone regarding your referral for bariatric surgery, or you may receive a letter on your MyChart portal from our referral team with instructions for scheduling an appointment. Please let us know if you have not been contacted by anyone within two weeks.  Please schedule a follow up visit for 3 months.  It was a pleasure to see you today!

## 2022-06-14 NOTE — Assessment & Plan Note (Signed)
Likely secondary to glucosuria from uncontrolled diabetes.  Rx for fluconazole 150 mg sent to pharmacy.

## 2022-06-14 NOTE — Assessment & Plan Note (Signed)
Referral placed for bariatric surgery.

## 2022-06-14 NOTE — Assessment & Plan Note (Signed)
Discussed options, recommended birth control.  Rx for NuvaRing sent to pharmacy.  Continue to monitor.

## 2022-06-14 NOTE — Progress Notes (Signed)
Subjective:    Patient ID: Katie [name], female    DOB: [birth year], 32 y.o.   MRN: [id number]  HPI  Anaisa Riesterer is a very pleasant 32 y.o. female with a history of type 2 diabetes, hypothyroidism, GAD, menorrhagia with regular cycle who presents today for follow up of diabetes and to discuss menorrhagia.  1) Type 2 Diabetes:  Current medications include: metformin XR 1000 mg daily, Basaglar 24 units in AM and 30 units in PM, Glipizide XL 10 mg daily, and Trulicity [id number] mg weekly.  She is checking her blood glucose continuously for the most part. Her sensor does fall off sometimes.   AM fasting: 250 Afternoon: "too high to read".    Last A1C: 11.1 in October 2023, 11.5 today Last Eye Exam: Due Last Foot Exam: UTD Pneumonia Vaccination: 2020 Urine Microalbumin: Due Statin: None. Child bearing age  Dietary changes since last visit: She continues to eat fast food for dinner. She is meal prepping for breakfast and lunch. Mostly drinking water.   She snacks twice daily, frequently eats from the vending machine. She is requesting a referral for information for gastric bypass.    Exercise: Some walking.   2) Menorrhagia with regular cycle: Chronic for the last year. Menses lasts for about 4-5 days, menses is very heavy for the first few days, saturating pads and tampons. LMP 06/07/22. She had her fallopian tubes removed. She is requesting treatment.   3) Vaginal Itching: Intermittent, more consistent to the labia majora for the last few weeks. She denies vaginal discharge. She is requesting fluconazole. A1C today of 11.5.    Review of Systems  Eyes:  Negative for visual disturbance.  Cardiovascular:  Negative for chest pain.  Endocrine: Positive for polydipsia, polyphagia and polyuria.  Genitourinary:  Positive for menstrual problem. Negative for vaginal discharge.       Vaginal itching.  Neurological:  Negative for dizziness and numbness.         Past Medical  History:  Diagnosis Date   Diabetes mellitus without complication    Family history of breast cancer    9/20 cancer genetic testing letter sent   History of cesarean section complicating pregnancy [id number]   History of preterm delivery 12/05/2018   Recommend serial cervical length screening starting at 16 weeks Refer for possible cerclage if cervical length < 2.5 cm   Supervision of high risk pregnancy in first trimester 11/15/2018   [facility]inic [facility] Prenatal Labs Dating  Blood type: B/Positive/-- (09/04 1216)  Genetic Screen 1 Screen:    AFP:     Quad:     NIPS: Antibody:Negative (09/04 1216) Anatomic [place]  Rubella: 5.05 (09/04 1216) Varicella: @VZVIGG @ GTT Early:               Third trimester:  RPR: Non Reactive (09/04 1216)  Rhogam  HBsAg: Negative (09/04 1216)  TDaP vaccine                       Flu Shot: HIV: Non Reactive (   Thyroid disease    Type 2 diabetes mellitus with hyperosmolarity without coma, with long-term current use of insulin 08/03/2017    Social History   Socioeconomic History   Marital status: Divorced    Spouse name: Ryheam Proofreader of children: Not on file   Years of education: Not on file   Highest education level: Not on file  Occupational History   Occupation: [employer]  Use   Smoking status: Never   Smokeless tobacco: Never  Vaping Use   Vaping Use: Never used  Substance and Sexual Activity   Alcohol use: No   Drug use: No   Sexual activity: Yes    Partners: Male    Birth control/protection: None  Other Topics Concern   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   Enjoys hiking, spending time with family, concerts   Social Determinants of Health   Financial Resource Strain: Not on file  Food Insecurity: Not on file  Transportation Needs: Not on file  Physical Activity: Not on file  Stress: Not on file  Social Connections: Not on file  Intimate Partner Violence: Not on  file    Past Surgical History:  Procedure Laterality Date   CESAREAN SECTION      Family History  Problem Relation Age of Onset   Diabetes Mother    Hypertension Mother    Hypothyroidism Mother    Diabetes Father    Kidney disease Maternal Grandmother    Leukemia Maternal Grandfather    Diabetes Paternal Grandmother    Breast cancer Maternal Aunt 76       has contact    No Known Allergies  Current Outpatient Medications on File Prior to Visit  Medication Sig Dispense Refill   Continuous Blood Gluc Sensor (FREESTYLE LIBRE 3 SENSOR) MISC Place 1 sensor on the skin every 14 days. Use to check glucose continuously 2 each 5   glipiZIDE (GLUCOTROL XL) 10 MG 24 hr tablet TAKE 1 TABLET BY MOUTH DAILY WITH BREAKFAST FOR DIABETES 90 tablet 0   glucose blood test strip Use as instructed 200 each 11   hydrOXYzine (ATARAX) 10 MG tablet Take 1 tablet (10 mg total) by mouth daily as needed (panic attacks). 30 tablet 0   Insulin Glargine (BASAGLAR KWIKPEN) 100 UNIT/ML INJDT 24 UNITS UNDER THE SKIN EVERY MORNING AND 30 UNITS EVERY EVENING FOR DIABETES 45 mL 0   levothyroxine (SYNTHROID) 50 MCG tablet TAKE 1 TABLET BY MOUTH EVERY MORNING ON AN EMPTY STOMACH WITH WATER ONLY, NO FOOD OR OTHER MEDICATIONS FOR 30 MINUTES 90 tablet 1   sertraline (ZOLOFT) 50 MG tablet Take 1 tablet (50 mg total) by mouth daily. For anxiety 90 tablet 1   No current facility-administered medications on file prior to visit.    BP 132/88   Pulse 80   Temp 98.6 F (37 C) (Temporal)   Ht 5\' 2"  (1.575 m)   Wt 265 lb (120.2 kg)   SpO2 96%   BMI 48.47 kg/m  Objective:   Physical Exam Cardiovascular:     Rate and Rhythm: Normal rate and regular rhythm.  Pulmonary:     Effort: Pulmonary effort is normal.     Breath sounds: Normal breath sounds.  Musculoskeletal:     Cervical back: Neck supple.  Skin:    General: Skin is warm and dry.  Neurological:     Mental Status: She is alert.  Psychiatric:        Mood  and Affect: Mood normal.           Assessment & Plan:  Uncontrolled type 2 diabetes mellitus with hyperglycemia Assessment & Plan: Uncontrolled and worse with A1C today of 11.5.   Discussed potential barriers to uncontrolled glucose readings which appears to be her diet. Discussed to eliminate vending machine food and fast food for dinner. She will  work on better meal prep at night.  Increase Trulicity to 1.5 mg weekly x 4 weeks, then increase to 3 mg thereafter. She will notify once she uses her last 1.5 mg pen. Continue Glipizide XL 10 mg daily. Continue Basaglar 24 units in AM and 30 units in PM.   Increase metformin XR to 500 mg in AM and continue XR 1000 mg in PM.   I've asked her to send glucose readings in 2 weeks. Follow up in 3 months.  Orders: -     POCT glycosylated hemoglobin (Hb A1C) -     Microalbumin / creatinine urine ratio -     Trulicity; Inject 1.5 mg into the skin once a week. for diabetes.  Dispense: 2 mL; Refill: 0  Class 3 severe obesity due to excess calories with serious comorbidity and body mass index (BMI) of 45.0 to 49.9 in adult Assessment & Plan: Referral placed for bariatric surgery.  Orders: -     Amb Referral to Bariatric Surgery  Vaginal itching Assessment & Plan: Likely secondary to glucosuria from uncontrolled diabetes.  Rx for fluconazole 150 mg sent to pharmacy.   Orders: -     Fluconazole; Take 1 tablet (150 mg total) by mouth once for 1 dose.  Dispense: 1 tablet; Refill: 0  Menorrhagia with regular cycle Assessment & Plan: Discussed options, recommended birth control.  Rx for NuvaRing sent to pharmacy.  Continue to monitor.     Orders: -     Etonogestrel-Ethinyl Estradiol; Insert vaginally and leave in place for 3 consecutive weeks, then remove for 1 week.  Dispense: 3 each; Refill: 3  Type 2 diabetes mellitus with hyperglycemia, with long-term current use of insulin -     metFORMIN HCl ER; Take 1 tablet by mouth every  morning and 2 tablets in the evening for diabetes.  Dispense: 270 tablet; Refill: 0        Pleas Koch, NP

## 2022-06-20 ENCOUNTER — Encounter: Payer: Self-pay | Admitting: *Deleted

## 2022-06-29 NOTE — Telephone Encounter (Signed)
Called and spoke to pt. Made her an appt with Dr. Ermalene Searing 06/30/2022 @ 8:20.

## 2022-06-30 ENCOUNTER — Encounter: Payer: Self-pay | Admitting: Family Medicine

## 2022-06-30 ENCOUNTER — Ambulatory Visit: Payer: BC Managed Care – PPO | Admitting: Family Medicine

## 2022-06-30 VITALS — BP 122/80 | HR 75 | Temp 97.7°F | Ht 62.0 in | Wt 265.4 lb

## 2022-06-30 DIAGNOSIS — L293 Anogenital pruritus, unspecified: Secondary | ICD-10-CM

## 2022-06-30 DIAGNOSIS — R300 Dysuria: Secondary | ICD-10-CM | POA: Insufficient documentation

## 2022-06-30 DIAGNOSIS — N898 Other specified noninflammatory disorders of vagina: Secondary | ICD-10-CM | POA: Diagnosis not present

## 2022-06-30 DIAGNOSIS — E1165 Type 2 diabetes mellitus with hyperglycemia: Secondary | ICD-10-CM

## 2022-06-30 HISTORY — DX: Anogenital pruritus, unspecified: L29.3

## 2022-06-30 LAB — POC URINALSYSI DIPSTICK (AUTOMATED)
Bilirubin, UA: NEGATIVE
Glucose, UA: POSITIVE — AB
Leukocytes, UA: NEGATIVE
Nitrite, UA: NEGATIVE
Protein, UA: NEGATIVE
Spec Grav, UA: 1.015 (ref 1.010–1.025)
Urobilinogen, UA: 0.2 E.U./dL
pH, UA: 6 (ref 5.0–8.0)

## 2022-06-30 MED ORDER — FLUCONAZOLE 150 MG PO TABS: 150.0000 mg | ORAL_TABLET | Freq: Once | ORAL | 0 refills | Status: AC

## 2022-06-30 MED ORDER — NYSTATIN 100000 UNIT/GM EX POWD: 1.0000 | Freq: Three times a day (TID) | CUTANEOUS | 0 refills | Status: DC

## 2022-06-30 NOTE — Assessment & Plan Note (Signed)
Acute, encourage decrease carbohydrates and increase water.  Urinalysis shows no sign of urinary tract infection.

## 2022-06-30 NOTE — Progress Notes (Signed)
Patient ID: Katie [name], female    DOB: 1990-08-15, 32 y.o.   MRN: [id number]  This visit was conducted in person.  BP 122/80   Pulse 75   Temp 97.7 F (36.5 C) (Temporal)   Ht 5\' 2"  (1.575 m)   Wt 265 lb 6 oz (120.4 kg)   SpO2 96%   BMI 48.54 kg/m    CC:  Chief Complaint  Patient presents with   Dysuria   Vaginal Discharge    Yellowish    Subjective:   HPI: Katie [name] is a 32 y.o. female  with poorly controlled DM presenting on 06/30/2022 for Dysuria and Vaginal Discharge (Yellowish)  Dysuria  This is a new problem. The current episode started in the past 7 days. The problem has been gradually worsening. The quality of the pain is described as burning. There has been no fever. She is Sexually active. There is No history of pyelonephritis. Associated symptoms include a discharge and nausea. Pertinent negatives include no chills, flank pain, frequency, hematuria, urgency or vomiting. Associated symptoms comments:  Cloudy urine. She has tried increased fluids (AZO) for the symptoms. The treatment provided mild relief. There is no history of catheterization, kidney stones, recurrent UTIs, a single kidney or a urological procedure.  Vaginal Discharge The patient's primary symptoms include genital itching and vaginal discharge. Primary symptoms comment: Has chronic yeast infections. The problem occurs intermittently. Associated symptoms include dysuria and nausea. Pertinent negatives include no abdominal pain, chills, constipation, diarrhea, fever, flank pain, frequency, headaches, hematuria, rash, sore throat, urgency or vomiting.    Nausea may be from increasing dose of trulicity. Lab Results  Component Value Date   HGBA1C 11.5 (A) 06/14/2022       Has history of recurring yeast infections... this feel different.  Relevant past medical, surgical, family and social history reviewed and updated as indicated. Interim medical history since our last visit  reviewed. Allergies and medications reviewed and updated. Outpatient Medications Prior to Visit  Medication Sig Dispense Refill   Continuous Blood Gluc Sensor (FREESTYLE LIBRE 3 SENSOR) MISC Place 1 sensor on the skin every 14 days. Use to check glucose continuously 2 each 5   Dulaglutide (TRULICITY) 1.5 MG/0.5ML SOPN Inject 1.5 mg into the skin once a week. for diabetes. 2 mL 0   etonogestrel-ethinyl estradiol (NUVARING) 0.12-0.015 MG/24HR vaginal ring Insert vaginally and leave in place for 3 consecutive weeks, then remove for 1 week. 3 each 3   glipiZIDE (GLUCOTROL XL) 10 MG 24 hr tablet TAKE 1 TABLET BY MOUTH DAILY WITH BREAKFAST FOR DIABETES 90 tablet 0   glucose blood test strip Use as instructed 200 each 11   hydrOXYzine (ATARAX) 10 MG tablet Take 1 tablet (10 mg total) by mouth daily as needed (panic attacks). 30 tablet 0   Insulin Glargine (BASAGLAR KWIKPEN) 100 UNIT/ML INJDT 24 UNITS UNDER THE SKIN EVERY MORNING AND 30 UNITS EVERY EVENING FOR DIABETES 45 mL 0   levothyroxine (SYNTHROID) 50 MCG tablet TAKE 1 TABLET BY MOUTH EVERY MORNING ON AN EMPTY STOMACH WITH WATER ONLY, NO FOOD OR OTHER MEDICATIONS FOR 30 MINUTES 90 tablet 1   metFORMIN (GLUCOPHAGE-XR) 500 MG 24 hr tablet Take 1 tablet by mouth every morning and 2 tablets in the evening for diabetes. 270 tablet 0   sertraline (ZOLOFT) 50 MG tablet Take 1 tablet (50 mg total) by mouth daily. For anxiety 90 tablet 1   No facility-administered medications prior to visit.     Per HPI  unless specifically indicated in ROS section below Review of Systems  Constitutional:  Negative for chills, fatigue, fever and unexpected weight change.  HENT:  Negative for congestion, ear pain, sinus pressure, sneezing, sore throat and trouble swallowing.   Eyes:  Negative for pain and itching.  Respiratory:  Negative for cough, shortness of breath and wheezing.   Cardiovascular:  Negative for chest pain, palpitations and leg swelling.   Gastrointestinal:  Positive for nausea. Negative for abdominal pain, blood in stool, constipation, diarrhea and vomiting.  Genitourinary:  Positive for dysuria and vaginal discharge. Negative for difficulty urinating, flank pain, frequency, hematuria, menstrual problem and urgency.  Skin:  Negative for rash.  Neurological:  Negative for syncope, weakness, light-headedness, numbness and headaches.  Psychiatric/Behavioral:  Negative for confusion and dysphoric mood. The patient is not nervous/anxious.    Objective:  BP 122/80   Pulse 75   Temp 97.7 F (36.5 C) (Temporal)   Ht  (1.575 m)   Wt 265 lb 6 oz (120.4 kg)   SpO2 96%   BMI 48.54 kg/m   Wt Readings from Last 3 Encounters:  06/30/22 265 lb 6 oz (120.4 kg)  06/14/22 265 lb (120.2 kg)  03/01/22 264 lb (119.7 kg)      Physical Exam Exam conducted with a chaperone present.  Constitutional:      General: She is not in acute distress.    Appearance: Normal appearance. She is well-developed. She is obese. She is not ill-appearing or toxic-appearing.  HENT:     Head: Normocephalic.     Right Ear: Hearing, tympanic membrane, ear canal and external ear normal. Tympanic membrane is not erythematous, retracted or bulging.     Left Ear: Hearing, tympanic membrane, ear canal and external ear normal. Tympanic membrane is not erythematous, retracted or bulging.     Nose: No mucosal edema or rhinorrhea.     Right Sinus: No maxillary sinus tenderness or frontal sinus tenderness.     Left Sinus: No maxillary sinus tenderness or frontal sinus tenderness.     Mouth/Throat:     Pharynx: Uvula midline.  Eyes:     General: Lids are normal. Lids are everted, no foreign bodies appreciated.     Conjunctiva/sclera: Conjunctivae normal.     Pupils: Pupils are equal, round, and reactive to light.  Neck:     Thyroid: No thyroid mass or thyromegaly.     Vascular: No carotid bruit.     Trachea: Trachea normal.  Cardiovascular:     Rate and  Rhythm: Normal rate and regular rhythm.     Pulses: Normal pulses.     Heart sounds: Normal heart sounds, S1 normal and S2 normal. No murmur heard.    No friction rub. No gallop.  Pulmonary:     Effort: Pulmonary effort is normal. No tachypnea or respiratory distress.     Breath sounds: Normal breath sounds. No decreased breath sounds, wheezing, rhonchi or rales.  Abdominal:     General: Bowel sounds are normal.     Palpations: Abdomen is soft.     Tenderness: There is no abdominal tenderness.  Genitourinary:    Exam position: Supine.     Labia:        Right: No tenderness or lesion.        Left: No tenderness or lesion.      Vagina: Vaginal discharge, erythema and tenderness present.     Comments: Erythema in bilateral leg skin folds Musculoskeletal:     Cervical  back: Normal range of motion and neck supple.  Skin:    General: Skin is warm and dry.     Findings: No rash.  Neurological:     Mental Status: She is alert.  Psychiatric:        Mood and Affect: Mood is not anxious or depressed.        Speech: Speech normal.        Behavior: Behavior normal. Behavior is cooperative.        Thought Content: Thought content normal.        Judgment: Judgment normal.       Results for orders placed or performed in visit on 06/14/22  Microalbumin/Creatinine Ratio, Urine  Result Value Ref Range   Microalb, Ur <0.7 0.0 - 1.9 mg/dL   Creatinine,U 161.0 mg/dL   Microalb Creat Ratio 0.6 0.0 - 30.0 mg/g  POCT glycosylated hemoglobin (Hb A1C)  Result Value Ref Range   Hemoglobin A1C 11.5 (A) 4.0 - 5.6 %   HbA1c POC (<> result, manual entry)     HbA1c, POC (prediabetic range)     HbA1c, POC (controlled diabetic range)      Assessment and Plan  Dysuria Assessment & Plan: Acute, encourage decrease carbohydrates and increase water.  Urinalysis shows no sign of urinary tract infection.  Orders: -     POCT Urinalysis Dipstick (Automated)  Vaginal discharge -     WET PREP BY  MOLECULAR PROBE  Uncontrolled type 2 diabetes mellitus with hyperglycemia Assessment & Plan: Chronic, poorly controlled.  Recent adjustment of Trulicity by PCP.  Glucose and ketones in urine.   Perineal itching, female Assessment & Plan:  Acute in setting of poorly controlled DM most consistent with   candidal intertrigo. She states topical nystatin has help externally in apst... will prescribe while awaiting wet prep results.   Vaginal itching Assessment & Plan:   Acute on chronic Eval with wet prep given concern for possible BV given thinner milkier discharge, but  will go ahead and cover for yeast with diflucan ( in setting of chronic yeast infecitons and poorly controlled DM.   Other orders -     Nystatin; Apply 1 Application topically 3 (three) times daily.  Dispense: 15 g; Refill: 0 -     Fluconazole; Take 1 tablet (150 mg total) by mouth once for 1 dose.  Dispense: 1 tablet; Refill: 0    No follow-ups on file.   Kerby Nora, MD

## 2022-06-30 NOTE — Assessment & Plan Note (Signed)
Acute on chronic Eval with wet prep given concern for possible BV given thinner milkier discharge, but  will go ahead and cover for yeast with diflucan ( in setting of chronic yeast infecitons and poorly controlled DM.

## 2022-06-30 NOTE — Assessment & Plan Note (Addendum)
Acute in setting of poorly controlled DM most consistent with   candidal intertrigo. She states topical nystatin has help externally in apst... will prescribe while awaiting wet prep results.

## 2022-06-30 NOTE — Assessment & Plan Note (Signed)
Chronic, poorly controlled.  Recent adjustment of Trulicity by PCP.  Glucose and ketones in urine.

## 2022-06-30 NOTE — Patient Instructions (Signed)
Continue working on decreasing carbohydrates, increase water.

## 2022-07-03 ENCOUNTER — Telehealth: Payer: Self-pay | Admitting: Primary Care

## 2022-07-03 LAB — WET PREP BY MOLECULAR PROBE
Candida species: NOT DETECTED
Gardnerella vaginalis: NOT DETECTED
MICRO NUMBER:: 14849180
SPECIMEN QUALITY:: ADEQUATE
Trichomonas vaginosis: DETECTED — AB

## 2022-07-03 NOTE — Telephone Encounter (Signed)
Patient called the office regarding lab results from visit on 4/19, had some questions regarding this and would like a call back whenever possible. Please advise [phone]. Patient asked if she could be called around noon tomorrow if not possible today.

## 2022-07-04 ENCOUNTER — Other Ambulatory Visit: Payer: Self-pay | Admitting: Family Medicine

## 2022-07-04 MED ORDER — METRONIDAZOLE 500 MG PO TABS: 500.0000 mg | ORAL_TABLET | Freq: Two times a day (BID) | ORAL | 0 refills | Status: DC

## 2022-07-04 NOTE — Telephone Encounter (Signed)
Patient did review her results and Dr. Daphine Deutscher comments on her MyChart.  I left a message asking her to call us back if she has any further questions or concerns.

## 2022-07-04 NOTE — Telephone Encounter (Signed)
Call Earlier today I sent a message via MyChart to patient.  Let me know if she has continued questions.

## 2022-07-07 ENCOUNTER — Encounter: Payer: Self-pay | Admitting: *Deleted

## 2022-07-07 NOTE — Telephone Encounter (Signed)
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 Because of the high rate of reinfection among women treated for trichomoniasis, retesting for trichomoniasis is recommended approximately 3 months after initial treatment for all sexually active women.

## 2022-07-07 NOTE — Telephone Encounter (Signed)
Patient returned call,stating that she do have questions regarding her labs.Would like a call back

## 2022-07-07 NOTE — Telephone Encounter (Signed)
MyChart message last read by [name] at  2:16 PM on 07/07/2022.

## 2022-07-07 NOTE — Telephone Encounter (Signed)
Spoke with Katie [name].  She has the following questions about testing positive for Trich:  Does she need to be retested after completing the antibiotics?  If so when?  Her partner also tested positive and he is also currently being treated.  When can they resume intercouse?  Please advise.

## 2022-07-07 NOTE — Telephone Encounter (Signed)
Left message for Katie [name] to return call to office.  I also let her know I would sent the Dr. Daphine Deutscher response to her questions to her MyChart if it would be easier to her to review there.

## 2022-07-28 ENCOUNTER — Other Ambulatory Visit: Payer: Self-pay | Admitting: Primary Care

## 2022-07-28 DIAGNOSIS — E1165 Type 2 diabetes mellitus with hyperglycemia: Secondary | ICD-10-CM

## 2022-07-28 MED ORDER — TRULICITY 1.5 MG/0.5ML ~~LOC~~ SOAJ: 1.5000 mg | SUBCUTANEOUS | 0 refills | Status: DC

## 2022-07-28 NOTE — Telephone Encounter (Signed)
From: [name] To: Office of Doreene Nest, NP Sent: 07/28/2022 3:50 PM EDT Subject: Medication Renewal Request  Refills have been requested for the following medications:   Dulaglutide (TRULICITY) 1.5 MG/0.5ML SOPN [Sylva Overley K Jairo Bellew]  Preferred pharmacy: [id number] [place], [place] - [address] AT [address] OF [address] Delivery method: [employer]

## 2022-08-02 ENCOUNTER — Telehealth: Payer: Self-pay | Admitting: Primary Care

## 2022-08-02 DIAGNOSIS — E1165 Type 2 diabetes mellitus with hyperglycemia: Secondary | ICD-10-CM

## 2022-08-02 NOTE — Telephone Encounter (Signed)
Patient contacted the office regarding medication Trulicity, states that many pharmacies in the area are out of the dosage she is prescribed. Wants to know if there is an alternative medication/ dosage of the same medication that can be sent in replacement of current dose. Please advise, thank you.

## 2022-08-03 NOTE — Telephone Encounter (Signed)
Unable to reach patient. Left voicemail to return call to our office.   

## 2022-08-03 NOTE — Telephone Encounter (Signed)
Please call patient:  How long has she been without Trulicity?  Many pharmacies are also out of the other injectable medications for diabetes.  We could try a pill version that is similar to Trulicity called Rybelsus.  Let me know if she is interested and I will send to her pharmacy.  Please stressed to her that she needs to notify me if her insurance will not cover Rybelsus.  She will also need a follow-up for diabetes scheduled for early July.  Please have her send me blood sugar readings via MyChart in 2 weeks after she starts the Rybelsus if she is agreeable.

## 2022-08-04 MED ORDER — RYBELSUS 3 MG PO TABS: 3.0000 mg | ORAL_TABLET | Freq: Every day | ORAL | 0 refills | Status: DC

## 2022-08-04 NOTE — Telephone Encounter (Signed)
Noted, prescription sent to pharmacy. °

## 2022-08-04 NOTE — Telephone Encounter (Signed)
We will start with Rybelsus 3 mg once daily x 30 days, then increase to 7 mg once daily thereafter.  She will need to take the pill every morning before her first meal of the day.  Please have her update [place] if the prescription is too expensive or her insurance does not cover.

## 2022-08-04 NOTE — Telephone Encounter (Signed)
Unable to reach patient. Left voicemail to return call to our office.   

## 2022-08-04 NOTE — Telephone Encounter (Signed)
Called and spoke to patient, she states she has been out of Trulicity for 2 weeks. She is agreeable to starting Rybelsus. She would like to know if she will be starting on the comparable dose as she was to Trulicity or if she will have to work her way up in dosage again.   She will send mychart message in 2 weeks with blood sugar levels. Scheduled diabetes f/u in July.

## 2022-08-04 NOTE — Telephone Encounter (Signed)
Called and advised patient of Katie [name] message. She verbalized understanding of instructions. She would like it sent to [employer] [facility] [employer].

## 2022-08-23 LAB — HM DIABETES EYE EXAM

## 2022-08-24 ENCOUNTER — Encounter: Payer: Self-pay | Admitting: Primary Care

## 2022-08-28 DIAGNOSIS — F411 Generalized anxiety disorder: Secondary | ICD-10-CM

## 2022-09-01 MED ORDER — SERTRALINE HCL 50 MG PO TABS: 50.0000 mg | ORAL_TABLET | Freq: Every day | ORAL | 0 refills | Status: DC

## 2022-09-13 ENCOUNTER — Other Ambulatory Visit: Payer: Self-pay | Admitting: Primary Care

## 2022-09-13 DIAGNOSIS — Z794 Long term (current) use of insulin: Secondary | ICD-10-CM

## 2022-09-13 DIAGNOSIS — E1100 Type 2 diabetes mellitus with hyperosmolarity without nonketotic hyperglycemic-hyperosmolar coma (NKHHC): Secondary | ICD-10-CM

## 2022-09-13 DIAGNOSIS — E1165 Type 2 diabetes mellitus with hyperglycemia: Secondary | ICD-10-CM

## 2022-09-13 MED ORDER — BASAGLAR KWIKPEN 100 UNIT/ML ~~LOC~~ SOPN: PEN_INJECTOR | SUBCUTANEOUS | 0 refills | Status: DC

## 2022-09-13 MED ORDER — RYBELSUS 7 MG PO TABS: 7.0000 mg | ORAL_TABLET | Freq: Every day | ORAL | 0 refills | Status: DC

## 2022-09-13 NOTE — Telephone Encounter (Signed)
From: [name] To: Office of Doreene Nest, NP Sent: 09/12/2022 5:11 PM EDT Subject: Medication Renewal Request  Refills have been requested for the following medications:   Insulin Glargine (BASAGLAR KWIKPEN) 100 UNIT/ML [[name]]   Semaglutide (RYBELSUS) 3 MG TABS [[name]]  Preferred pharmacy: [id number] [address], [place] - [address] OF [address] Delivery method: [employer]

## 2022-09-13 NOTE — Telephone Encounter (Signed)
When I spoke to paitent earlier this afternoon she could not do 07/16 as she needed an early morning appt and there were no slots available for that day. That is why I scheduled her on 07/17. Is there another day you would like her rescheduled for that has an early morning slot?

## 2022-09-13 NOTE — Telephone Encounter (Signed)
I requested that she be scheduled on 09/26/2022.  Can we change?

## 2022-09-13 NOTE — Telephone Encounter (Signed)
Patient states she is doing very well on the Rybelsus, she is agreeable to increasing to 7mg .   Rescheduled patient appt for 09/27/22

## 2022-09-13 NOTE — Telephone Encounter (Signed)
Please call patient:  Has she tolerated the Rybelsus pills okay? If so, I'd like to increase her dose to 7 mg daily.   Can we reschedule her appointment for the 16th? She is currently scheduled for the 18th and I will not be here.

## 2022-09-13 NOTE — Telephone Encounter (Signed)
Unable to reach patient. Left voicemail to return call to our office.   

## 2022-09-18 NOTE — Telephone Encounter (Signed)
Rescheduled patient for 7:20 am on 09/25/22.

## 2022-09-18 NOTE — Telephone Encounter (Signed)
I will be out of the office. I will be opening my Monday schedule for 07/15. Can she do that?

## 2022-09-21 ENCOUNTER — Other Ambulatory Visit: Payer: Self-pay | Admitting: Primary Care

## 2022-09-21 DIAGNOSIS — Z794 Long term (current) use of insulin: Secondary | ICD-10-CM

## 2022-09-25 ENCOUNTER — Encounter: Payer: Self-pay | Admitting: Primary Care

## 2022-09-25 ENCOUNTER — Ambulatory Visit: Payer: BC Managed Care – PPO | Admitting: Primary Care

## 2022-09-25 VITALS — BP 132/78 | HR 86 | Temp 97.8°F | Ht 62.0 in | Wt 267.0 lb

## 2022-09-25 DIAGNOSIS — N898 Other specified noninflammatory disorders of vagina: Secondary | ICD-10-CM | POA: Diagnosis not present

## 2022-09-25 DIAGNOSIS — E039 Hypothyroidism, unspecified: Secondary | ICD-10-CM | POA: Diagnosis not present

## 2022-09-25 DIAGNOSIS — Z6841 Body Mass Index (BMI) 40.0 and over, adult: Secondary | ICD-10-CM | POA: Diagnosis not present

## 2022-09-25 DIAGNOSIS — E1165 Type 2 diabetes mellitus with hyperglycemia: Secondary | ICD-10-CM | POA: Diagnosis not present

## 2022-09-25 LAB — LIPID PANEL
Cholesterol: 160 mg/dL (ref 0–200)
HDL: 38.1 mg/dL — ABNORMAL LOW (ref >39.00)
LDL Cholesterol: 92 mg/dL (ref 0–99)
NonHDL: 122.31
Total CHOL/HDL Ratio: 4
Triglycerides: 150 mg/dL — ABNORMAL HIGH (ref 0.0–149.0)
VLDL: 30 mg/dL (ref 0.0–40.0)

## 2022-09-25 LAB — TSH: TSH: 2.17 u[IU]/mL (ref 0.35–5.50)

## 2022-09-25 LAB — COMPREHENSIVE METABOLIC PANEL
ALT: 19 U/L (ref 0–35)
AST: 18 U/L (ref 0–37)
Albumin: 3.9 g/dL (ref 3.5–5.2)
Alkaline Phosphatase: 83 U/L (ref 39–117)
BUN: 7 mg/dL (ref 6–23)
CO2: 28 mEq/L (ref 19–32)
Calcium: 9 mg/dL (ref 8.4–10.5)
Chloride: 102 mEq/L (ref 96–112)
Creatinine, Ser: 0.73 mg/dL (ref 0.40–1.20)
GFR: 109.61 mL/min (ref >60.00)
Glucose, Bld: 183 mg/dL — ABNORMAL HIGH (ref 70–99)
Potassium: 4.4 mEq/L (ref 3.5–5.1)
Sodium: 137 mEq/L (ref 135–145)
Total Bilirubin: 0.6 mg/dL (ref 0.2–1.2)
Total Protein: 7 g/dL (ref 6.0–8.3)

## 2022-09-25 LAB — POCT GLYCOSYLATED HEMOGLOBIN (HGB A1C): Hemoglobin A1C: 10.8 % — AB (ref 4.0–5.6)

## 2022-09-25 LAB — VITAMIN D 25 HYDROXY (VIT D DEFICIENCY, FRACTURES): VITD: 16.74 ng/mL — ABNORMAL LOW (ref 30.00–100.00)

## 2022-09-25 LAB — VITAMIN B12: Vitamin B-12: 319 pg/mL (ref 211–911)

## 2022-09-25 MED ORDER — FLUCONAZOLE 150 MG PO TABS: ORAL_TABLET | ORAL | 0 refills | Status: DC

## 2022-09-25 NOTE — Assessment & Plan Note (Signed)
Prescription for fluconazole 150 mg tablets provided. Working to gain better control over glucose levels.

## 2022-09-25 NOTE — Assessment & Plan Note (Signed)
Discussed proper instructions for taking levothyroxine. Repeat TSH pending.  Continue levothyroxine 50 mcg daily.

## 2022-09-25 NOTE — Assessment & Plan Note (Signed)
Slight improvement with A1c of 10.8 today.  We agreed to continue Rybelsus 7 mg for another 30 days, then consider increasing to 14 mg daily thereafter. Continue Rybelsus 7 mg daily for now.   Continue metformin XR 1000 mg daily. Continue glipizide XL 10 mg daily. Continue Basaglar 24 units in a.m. and 30 units in p.m.  Follow-up in 3 months.

## 2022-09-25 NOTE — Assessment & Plan Note (Signed)
Commended her on dietary changes. Encouraged to continue.  Continue Rybelsus 7 mg daily. Labs pending.

## 2022-09-25 NOTE — Progress Notes (Signed)
Subjective:    Patient ID: Katie [name], female    DOB: 1990/10/21, 32 y.o.   MRN: [id number]  HPI  Shantil Vallejo is a very pleasant 32 y.o. female with a history of uncontrolled type 2 diabetes, hypothyroidism, GAD who presents today for follow-up of diabetes. She would also like to discuss vaginal itching.  1) Type 2 Diabetes:  Current medications include: Rybelsus 7 mg daily, metformin XR 1000 mg daily, Basaglar 24 units in a.m. and 30 units in p.m., glipizide XL 10 mg daily  She is checking her blood glucose a few times weekly and is getting readings of:  Low 100's to 350. More recently is seeing numbers in the 100-200 range.   Last A1C: 11.5 in April 2024,  Last Eye Exam: Up-to-date Last Foot Exam: Up-to-date Pneumonia Vaccination: 2020 Urine Microalbumin: Up-to-date Statin: None.  Dietary changes since last visit: She is eating less because she has less of an appetite. She is cooking at home 5-6 days weekly. No longer drinking sweet tea. Switched to zero sugar drinks.    Exercise: None.   Wt Readings from Last 3 Encounters:  09/25/22 267 lb (121.1 kg)  06/30/22 265 lb 6 oz (120.4 kg)  06/14/22 265 lb (120.2 kg)     2) Vaginal Itching: Chronic vaginal itching. Prior history of recurrent vaginitis which is secondary to uncontrolled type 2 diabetes. Symptoms have improved some since glucose levels have improved.   BP Readings from Last 3 Encounters:  09/25/22 132/78  06/30/22 122/80  06/14/22 132/88   3) Hypothyroidism: Currently managed on levothyroxine 50 mcg. She is taking Rybelsus first in the morning, then taking levothyroxine. She has noticed feeling more tired over the last few months.     Review of Systems  Eyes:  Negative for visual disturbance.  Gastrointestinal:  Negative for abdominal pain, constipation and nausea.  Neurological:  Negative for dizziness and numbness.         Past Medical History:  Diagnosis Date   Diabetes mellitus  without complication (HCC)    Family history of breast cancer    9/20 cancer genetic testing letter sent   History of cesarean section complicating pregnancy 11/15/2018   History of preterm delivery 12/05/2018   Recommend serial cervical length screening starting at 16 weeks Refer for possible cerclage if cervical length < 2.5 cm   Supervision of high risk pregnancy in first trimester 11/15/2018   Clinic [facility] Prenatal Labs Dating  Blood type: B/Positive/-- (09/04 1216)  Genetic Screen 1 Screen:    AFP:     Quad:     NIPS: Antibody:Negative (09/04 1216) Anatomic [place]  Rubella: 5.05 (09/04 1216) Varicella: @VZVIGG @ GTT Early:               Third trimester:  RPR: Non Reactive (09/04 1216)  Rhogam  HBsAg: Negative (09/04 1216)  TDaP vaccine                       Flu Shot: HIV: Non Reactive (   Thyroid disease    Type 2 diabetes mellitus with hyperosmolarity without coma, with long-term current use of insulin (HCC) 08/03/2017    Social History   Socioeconomic History   Marital status: Divorced    Spouse name: Ryheam Research scientist (life sciences) of children: Not on file   Years of education: Not on file   Highest education level: Not on file  Occupational History   Occupation: EC Teacher  Tobacco Use  Smoking status: Never   Smokeless tobacco: Never  Vaping Use   Vaping status: Never Used  Substance and Sexual Activity   Alcohol use: No   Drug use: No   Sexual activity: Yes    Partners: Male    Birth control/protection: None  Other Topics Concern   Not on file  Social History Narrative   Works as a Runner, 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   Enjoys hiking, spending time with family, concerts   Social Determinants of Health   Financial Resource Strain: Not on file  Food Insecurity: Not on file  Transportation Needs: Not on file  Physical Activity: Not on file  Stress: Not on file  Social Connections: Not on file  Intimate Partner Violence: Not on file    Past Surgical History:   Procedure Laterality Date   CESAREAN SECTION      Family History  Problem Relation Age of Onset   Diabetes Mother    Hypertension Mother    Hypothyroidism Mother    Diabetes Father    Kidney disease Maternal Grandmother    Leukemia Maternal Grandfather    Diabetes Paternal Grandmother    Breast cancer Maternal Aunt 71       has contact    No Known Allergies  Current Outpatient Medications on File Prior to Visit  Medication Sig Dispense Refill   Continuous Blood Gluc Sensor (FREESTYLE LIBRE 3 SENSOR) MISC Place 1 sensor on the skin every 14 days. Use to check glucose continuously 2 each 5   glipiZIDE (GLUCOTROL XL) 10 MG 24 hr tablet TAKE 1 TABLET BY MOUTH DAILY WITH BREAKFAST FOR DIABETES 90 tablet 0   glucose blood test strip Use as instructed 200 each 11   hydrOXYzine (ATARAX) 10 MG tablet Take 1 tablet (10 mg total) by mouth daily as needed (panic attacks). 30 tablet 0   Insulin Glargine (BASAGLAR KWIKPEN) 100 UNIT/ML INJDT 24 UNITS UNDER THE SKIN EVERY MORNING AND 30 UNITS EVERY EVENING FOR DIABETES 45 mL 0   levothyroxine (SYNTHROID) 50 MCG tablet TAKE 1 TABLET BY MOUTH EVERY MORNING ON AN EMPTY STOMACH WITH WATER ONLY, NO FOOD OR OTHER MEDICATIONS FOR 30 MINUTES 90 tablet 1   metFORMIN (GLUCOPHAGE-XR) 500 MG 24 hr tablet TAKE 2 TABLETS(1000 MG) BY MOUTH DAILY WITH BREAKFAST FOR DIABETES 180 tablet 0   metroNIDAZOLE (FLAGYL) 500 MG tablet Take 1 tablet (500 mg total) by mouth 2 (two) times daily. 14 tablet 0   nystatin (MYCOSTATIN/NYSTOP) powder Apply 1 Application topically 3 (three) times daily. 15 g 0   Semaglutide (RYBELSUS) 7 MG TABS Take 1 tablet (7 mg total) by mouth daily. for diabetes. 30 tablet 0   sertraline (ZOLOFT) 50 MG tablet Take 1 tablet (50 mg total) by mouth daily. For anxiety 90 tablet 0   etonogestrel-ethinyl estradiol (NUVARING) 0.12-0.015 MG/24HR vaginal ring Insert vaginally and leave in place for 3 consecutive weeks, then remove for 1 week. (Patient not  taking: Reported on 09/25/2022) 3 each 3   No current facility-administered medications on file prior to visit.    BP 132/78   Pulse 86   Temp 97.8 F (36.6 C) (Temporal)   Ht 5\' 2"  (1.575 m)   Wt 267 lb (121.1 kg)   SpO2 98%   BMI 48.83 kg/m  Objective:   Physical Exam Cardiovascular:     Rate and Rhythm: Normal rate and regular rhythm.  Pulmonary:     Effort: Pulmonary effort is normal.  Breath sounds: Normal breath sounds.  Musculoskeletal:     Cervical back: Neck supple.  Skin:    General: Skin is warm and dry.           Assessment & Plan:  Uncontrolled type 2 diabetes mellitus with hyperglycemia (HCC) Assessment & Plan: Slight improvement with A1c of 10.8 today.  We agreed to continue Rybelsus 7 mg for another 30 days, then consider increasing to 14 mg daily thereafter. Continue Rybelsus 7 mg daily for now.   Continue metformin XR 1000 mg daily. Continue glipizide XL 10 mg daily. Continue Basaglar 24 units in a.m. and 30 units in p.m.  Follow-up in 3 months.  Orders: -     POCT glycosylated hemoglobin (Hb A1C) -     Lipid panel -     Comprehensive metabolic panel  Vaginal itching Assessment & Plan: Prescription for fluconazole 150 mg tablets provided. Working to gain better control over glucose levels.  Orders: -     Fluconazole; Take 1 tablet by mouth today, then repeat with second tablet in 3 days.  Dispense: 2 tablet; Refill: 0  Hypothyroidism, unspecified type Assessment & Plan: Discussed proper instructions for taking levothyroxine. Repeat TSH pending.  Continue levothyroxine 50 mcg daily.  Orders: -     VITAMIN D 25 Hydroxy (Vit-D Deficiency, Fractures) -     Vitamin B12 -     TSH  Class 3 severe obesity due to excess calories with serious comorbidity and body mass index (BMI) of 45.0 to 49.9 in adult [facility]) Assessment & Plan: Commended her on dietary changes. Encouraged to continue.  Continue Rybelsus 7 mg daily. Labs  pending.  Orders: -     VITAMIN D 25 Hydroxy (Vit-D Deficiency, Fractures) -     Vitamin B12 -     Lipid panel -     Comprehensive metabolic panel        Doreene Nest, NP

## 2022-09-25 NOTE — Patient Instructions (Addendum)
Stop by the lab prior to leaving today. I will notify you of your results once received.   Start fluconazole for the itching. Take 1 tablet today. Repeat with another tablet in 3 days.  Continue Rybelsus 7 mg daily for now.  Please schedule a physical to meet with me in 3 months.   It was a pleasure to see you today!

## 2022-09-27 ENCOUNTER — Other Ambulatory Visit: Payer: Self-pay | Admitting: Primary Care

## 2022-09-27 ENCOUNTER — Ambulatory Visit: Payer: BC Managed Care – PPO | Admitting: Primary Care

## 2022-09-27 DIAGNOSIS — Z794 Long term (current) use of insulin: Secondary | ICD-10-CM

## 2022-09-28 ENCOUNTER — Ambulatory Visit: Payer: BC Managed Care – PPO | Admitting: Primary Care

## 2022-09-29 ENCOUNTER — Telehealth: Payer: Self-pay | Admitting: Primary Care

## 2022-09-29 DIAGNOSIS — E559 Vitamin D deficiency, unspecified: Secondary | ICD-10-CM

## 2022-09-29 MED ORDER — VITAMIN D (ERGOCALCIFEROL) 1.25 MG (50000 UNIT) PO CAPS: ORAL_CAPSULE | ORAL | 0 refills | Status: DC

## 2022-09-29 NOTE — Telephone Encounter (Signed)
Called patient states readings are in 60-70 at night. Was getting alerts in the middle of night that they are doping. She did not eat before bed. Readings are in the green about all the time other. She is taking 24U am and 30U right before bed. She was not sure If you wanted to reduce her night dose.   She is ok with starting vitamin D would like sent in as well as glipizide

## 2022-09-29 NOTE — Addendum Note (Signed)
Addended by: Doreene Nest on: 09/29/2022 05:02 PM   Modules accepted: Orders

## 2022-09-29 NOTE — Telephone Encounter (Signed)
Responded to patient via MyChart.

## 2022-09-29 NOTE — Telephone Encounter (Signed)
Pt called to let Katie [name] know that the meds she prescribed is working but her sugar has been running low. Pt requested advice on how to manage low sugar & asked should she start taking insulin when needed? Pt also stated her pharmacy informed her that they didn't receive a rx for vitamin D. Call back # [phone]

## 2022-10-16 ENCOUNTER — Other Ambulatory Visit: Payer: Self-pay | Admitting: Primary Care

## 2022-10-16 DIAGNOSIS — E1165 Type 2 diabetes mellitus with hyperglycemia: Secondary | ICD-10-CM

## 2022-11-02 ENCOUNTER — Other Ambulatory Visit: Payer: Self-pay | Admitting: Primary Care

## 2022-11-02 DIAGNOSIS — E039 Hypothyroidism, unspecified: Secondary | ICD-10-CM

## 2022-11-20 DIAGNOSIS — E1165 Type 2 diabetes mellitus with hyperglycemia: Secondary | ICD-10-CM

## 2022-11-20 DIAGNOSIS — N898 Other specified noninflammatory disorders of vagina: Secondary | ICD-10-CM

## 2022-11-20 MED ORDER — FREESTYLE LIBRE 3 SENSOR MISC
1 refills | Status: DC
Start: 2022-11-20 — End: 2022-12-26

## 2022-11-20 MED ORDER — FLUCONAZOLE 150 MG PO TABS
150.0000 mg | ORAL_TABLET | Freq: Once | ORAL | 0 refills | Status: AC
Start: 2022-11-20 — End: 2022-11-20

## 2022-11-20 MED ORDER — RYBELSUS 3 MG PO TABS
3.0000 mg | ORAL_TABLET | Freq: Every day | ORAL | 0 refills | Status: DC
Start: 2022-11-20 — End: 2023-01-11

## 2022-11-27 ENCOUNTER — Other Ambulatory Visit: Payer: Self-pay

## 2022-11-27 DIAGNOSIS — E1100 Type 2 diabetes mellitus with hyperosmolarity without nonketotic hyperglycemic-hyperosmolar coma (NKHHC): Secondary | ICD-10-CM

## 2022-11-27 MED ORDER — BASAGLAR KWIKPEN 100 UNIT/ML ~~LOC~~ SOPN
PEN_INJECTOR | SUBCUTANEOUS | 0 refills | Status: DC
Start: 2022-11-27 — End: 2023-01-11

## 2022-12-05 ENCOUNTER — Telehealth: Payer: Self-pay

## 2022-12-05 NOTE — Telephone Encounter (Signed)
Pharmacy Patient Advocate Encounter   Received notification from CoverMyMeds that prior authorization for Rybelsus 3MG  tablets is required/requested.   Insurance verification completed.   The patient is insured through [facility] .   Per test claim: PA required; PA submitted to [facility] via CoverMyMeds Key/confirmation #/EOC [id number] Status is pending

## 2022-12-06 ENCOUNTER — Other Ambulatory Visit: Payer: Self-pay

## 2022-12-06 ENCOUNTER — Other Ambulatory Visit (HOSPITAL_COMMUNITY): Payer: Self-pay

## 2022-12-06 DIAGNOSIS — F411 Generalized anxiety disorder: Secondary | ICD-10-CM

## 2022-12-06 NOTE — Telephone Encounter (Signed)
Pharmacy Patient Advocate Encounter  Received notification from [facility] that Prior Authorization for Rybelsus 3MG  tablets has been APPROVED from 12/05/22 to 12/04/25. Ran test claim, Copay is $14.99. This test claim was processed through [facility]- copay amounts may vary at other pharmacies due to pharmacy/plan contracts, or as the patient moves through the different stages of their insurance plan.   PA #/Case ID/Reference #: [id number]

## 2022-12-07 MED ORDER — SERTRALINE HCL 50 MG PO TABS
50.0000 mg | ORAL_TABLET | Freq: Every day | ORAL | 0 refills | Status: DC
Start: 2022-12-07 — End: 2023-03-18

## 2022-12-08 ENCOUNTER — Other Ambulatory Visit: Payer: Self-pay

## 2022-12-08 DIAGNOSIS — N898 Other specified noninflammatory disorders of vagina: Secondary | ICD-10-CM

## 2022-12-08 MED ORDER — FLUCONAZOLE 150 MG PO TABS
150.0000 mg | ORAL_TABLET | Freq: Once | ORAL | 0 refills | Status: AC
Start: 2022-12-08 — End: 2022-12-08

## 2022-12-25 ENCOUNTER — Other Ambulatory Visit: Payer: Self-pay | Admitting: Primary Care

## 2022-12-25 DIAGNOSIS — E1165 Type 2 diabetes mellitus with hyperglycemia: Secondary | ICD-10-CM

## 2022-12-27 ENCOUNTER — Encounter: Payer: BC Managed Care – PPO | Admitting: Primary Care

## 2023-01-11 ENCOUNTER — Ambulatory Visit: Payer: BC Managed Care – PPO | Admitting: Primary Care

## 2023-01-11 ENCOUNTER — Encounter: Payer: Self-pay | Admitting: Primary Care

## 2023-01-11 VITALS — BP 132/84 | HR 90 | Temp 97.4°F | Ht 62.0 in | Wt 258.0 lb

## 2023-01-11 DIAGNOSIS — Z113 Encounter for screening for infections with a predominantly sexual mode of transmission: Secondary | ICD-10-CM

## 2023-01-11 DIAGNOSIS — E1100 Type 2 diabetes mellitus with hyperosmolarity without nonketotic hyperglycemic-hyperosmolar coma (NKHHC): Secondary | ICD-10-CM | POA: Diagnosis not present

## 2023-01-11 DIAGNOSIS — E1165 Type 2 diabetes mellitus with hyperglycemia: Secondary | ICD-10-CM

## 2023-01-11 DIAGNOSIS — N898 Other specified noninflammatory disorders of vagina: Secondary | ICD-10-CM | POA: Diagnosis not present

## 2023-01-11 DIAGNOSIS — Z794 Long term (current) use of insulin: Secondary | ICD-10-CM

## 2023-01-11 LAB — POCT GLYCOSYLATED HEMOGLOBIN (HGB A1C): Hemoglobin A1C: 13.7 % — AB (ref 4.0–5.6)

## 2023-01-11 MED ORDER — FREESTYLE LIBRE 3 PLUS SENSOR MISC
1 refills | Status: DC
Start: 2023-01-11 — End: 2024-02-04

## 2023-01-11 MED ORDER — CLOBETASOL PROPIONATE 0.05 % EX CREA: 1.0000 | TOPICAL_CREAM | Freq: Two times a day (BID) | CUTANEOUS | 0 refills | Status: AC

## 2023-01-11 MED ORDER — BASAGLAR KWIKPEN 100 UNIT/ML ~~LOC~~ SOPN: PEN_INJECTOR | SUBCUTANEOUS | Status: DC

## 2023-01-11 MED ORDER — BLOOD GLUCOSE TEST VI STRP
1.0000 | ORAL_STRIP | Freq: Three times a day (TID) | 1 refills | Status: AC
Start: 2023-01-11 — End: ?

## 2023-01-11 MED ORDER — BLOOD GLUCOSE MONITORING SUPPL DEVI
1.0000 | Freq: Three times a day (TID) | 0 refills | Status: AC
Start: 2023-01-11 — End: ?

## 2023-01-11 MED ORDER — RYBELSUS 7 MG PO TABS
7.0000 mg | ORAL_TABLET | Freq: Every day | ORAL | 0 refills | Status: DC
Start: 2023-01-11 — End: 2023-04-05

## 2023-01-11 MED ORDER — LANCET DEVICE MISC: 1.0000 | Freq: Three times a day (TID) | 0 refills | Status: AC

## 2023-01-11 MED ORDER — LANCETS MISC. MISC: 1.0000 | Freq: Three times a day (TID) | 1 refills | Status: AC

## 2023-01-11 NOTE — Progress Notes (Addendum)
Subjective:    Patient ID: Katie [name], female    DOB: 1991/01/10, 32 y.o.   MRN: [id number]  HPI  Jake Zarrella is a very pleasant 32 y.o. female with a history of uncontrolled type 2 diabetes, vaginal Candida, GAD, obesity who presents today for diabetes and to discuss vaginal irritation.  1) Type 2 Diabetes:  Current medications include: Glipizide XL 10 mg daily, Basaglar 24 units in the morning and 30 units in the evening, metformin XR 1000 mg daily, Rybelsus 3 mg daily.  She is injecting Basaglar 30 units in AM and 20 units in PM. She forgets to take Glipizide 3-4 times weekly as she forgets.   She is checking her blood glucose 0 times daily.   Last A1C: 10.8 in July 2024, 13.7 today Last Eye Exam: Up-to-date Last Foot Exam: Up-to-date Pneumonia Vaccination: 2020 Urine Microalbumin: Up-to-date Statin: None.  Dietary changes since last visit: Sandwiches, lunchables, yogurt, chips, cookies, candy.  Good water intake, zero sugar sweet tea. Cutting back on zero sugar soda.    Exercise: None  2) Vaginal Irritation: History of vaginal Candida.  Treated with Diflucan 150 mg in late September for vaginal itching which was presumed to be secondary to Candida infection.  Symptoms improved slightly in September once she completed the dose of fluconazole but without resolve. She continues to experience vaginal skin irritation, vaginal dryness, vaginal skin peeling, vaginal itching, polyuria, polydipsia. She's been using hot water to help with relief which she thinks is breaking down her skin.   She denies vaginal discharge. She is sexually active, is not using protection.     Review of Systems  Gastrointestinal:  Negative for abdominal pain, constipation and diarrhea.  Endocrine: Positive for polydipsia and polyuria.  Genitourinary:  Positive for dysuria and frequency. Negative for vaginal discharge.       Vaginal itching  Neurological:  Negative for dizziness and  numbness.         Past Medical History:  Diagnosis Date   Diabetes mellitus without complication (HCC)    Family history of breast cancer    9/20 cancer genetic testing letter sent   History of cesarean section complicating pregnancy 11/15/2018   History of preterm delivery 12/05/2018   Recommend serial cervical length screening starting at 16 weeks Refer for possible cerclage if cervical length < 2.5 cm   Supervision of high risk pregnancy in first trimester 11/15/2018   Clinic [facility] Prenatal Labs Dating  Blood type: B/Positive/-- (09/04 1216)  Genetic Screen 1 Screen:    AFP:     Quad:     NIPS: Antibody:Negative (09/04 1216) Anatomic [place]  Rubella: 5.05 (09/04 1216) Varicella: @VZVIGG @ GTT Early:               Third trimester:  RPR: Non Reactive (09/04 1216)  Rhogam  HBsAg: Negative (09/04 1216)  TDaP vaccine                       Flu Shot: HIV: Non Reactive (   Thyroid disease    Type 2 diabetes mellitus with hyperosmolarity without coma, with long-term current use of insulin (HCC) 08/03/2017    Social History   Socioeconomic History   Marital status: Divorced    Spouse name: Ryheam Research scientist (life sciences) of children: Not on file   Years of education: Not on file   Highest education level: Not on file  Occupational History   Occupation: EC Teacher  Tobacco Use  Smoking status: Never   Smokeless tobacco: Never  Vaping Use   Vaping status: Never Used  Substance and Sexual Activity   Alcohol use: No   Drug use: No   Sexual activity: Yes    Partners: Male    Birth control/protection: None  Other Topics Concern   Not on file  Social History Narrative   Works as a Runner, 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   Enjoys hiking, spending time with family, concerts   Social Determinants of Health   Financial Resource Strain: Not on file  Food Insecurity: Not on file  Transportation Needs: Not on file  Physical Activity: Not on file  Stress: Not on file  Social  Connections: Not on file  Intimate Partner Violence: Not on file    Past Surgical History:  Procedure Laterality Date   CESAREAN SECTION      Family History  Problem Relation Age of Onset   Diabetes Mother    Hypertension Mother    Hypothyroidism Mother    Diabetes Father    Kidney disease Maternal Grandmother    Leukemia Maternal Grandfather    Diabetes Paternal Grandmother    Breast cancer Maternal Aunt 58       has contact    No Known Allergies  Current Outpatient Medications on File Prior to Visit  Medication Sig Dispense Refill   glipiZIDE (GLUCOTROL XL) 10 MG 24 hr tablet TAKE 1 TABLET BY MOUTH DAILY WITH BREAKFAST FOR DIABETES 90 tablet 0   hydrOXYzine (ATARAX) 10 MG tablet Take 1 tablet (10 mg total) by mouth daily as needed (panic attacks). 30 tablet 0   levothyroxine (SYNTHROID) 50 MCG tablet TAKE 1 TABLET BY MOUTH EVERY MORNING ON AN EMPTY STOMACH WITH WATER ONLY, NO FOOD OR OTHER MEDICATIONS FOR 30 MINUTES 90 tablet 2   metFORMIN (GLUCOPHAGE-XR) 500 MG 24 hr tablet TAKE 2 TABLETS(1000 MG) BY MOUTH DAILY WITH BREAKFAST FOR DIABETES 180 tablet 0   sertraline (ZOLOFT) 50 MG tablet Take 1 tablet (50 mg total) by mouth daily. For anxiety 90 tablet 0   etonogestrel-ethinyl estradiol (NUVARING) 0.12-0.015 MG/24HR vaginal ring Insert vaginally and leave in place for 3 consecutive weeks, then remove for 1 week. (Patient not taking: Reported on 09/25/2022) 3 each 3   No current facility-administered medications on file prior to visit.    BP 132/84   Pulse 90   Temp (!) 97.4 F (36.3 C) (Temporal)   Ht 5\' 2"  (1.575 m)   Wt 258 lb (117 kg)   LMP 01/04/2023   SpO2 97%   BMI 47.19 kg/m  Objective:   Physical Exam Exam conducted with a chaperone present.  Constitutional:      Appearance: She is not ill-appearing.  HENT:     Right Ear: Tympanic membrane and ear canal normal.     Left Ear: Tympanic membrane and ear canal normal.     Nose: No mucosal edema.     Right  Sinus: No maxillary sinus tenderness or frontal sinus tenderness.     Left Sinus: No maxillary sinus tenderness or frontal sinus tenderness.     Mouth/Throat:     Mouth: Mucous membranes are moist.  Eyes:     Conjunctiva/sclera: Conjunctivae normal.  Cardiovascular:     Rate and Rhythm: Normal rate and regular rhythm.  Pulmonary:     Effort: Pulmonary effort is normal.     Breath sounds: Normal breath sounds. No wheezing or rhonchi.  Genitourinary:    Labia:  Right: Rash and tenderness present.        Left: Rash and tenderness present.      Vagina: Vaginal discharge present.     Cervix: No discharge.     Comments: Moderate erythema to bilateral labia majora. Scant amount of whitish vaginal discharge. Musculoskeletal:     Cervical back: Neck supple.  Skin:    General: Skin is warm and dry.           Assessment & Plan:  Uncontrolled type 2 diabetes mellitus with hyperglycemia (HCC) Assessment & Plan: Deteriorated and uncontrolled with A1c of 13.7 today!  Long discussion with patient today regarding compliance and a plan moving forward.  Continue metformin XR 1000 mg daily. Discussed to work on compliance with glipizide.  Continue glipizide XL 10 mg daily.  Continue Basaglar 30 units in the morning, increase Basaglar to 25 units at night.  Strongly advised that she start checking glucose levels.  Prescription for freestyle libre 3+ and glucometer kit sent to pharmacy.  Increase Rybelsus to 7 mg daily.  New prescription sent to pharmacy.  Close follow-up in 1 month.  Orders: -     POCT glycosylated hemoglobin (Hb A1C) -     FreeStyle Libre 3 Plus Sensor; Use to check blood sugar continuously. Change sensor every 15 days.  Dispense: 6 each; Refill: 1 -     Blood Glucose Monitoring Suppl; 1 each by Does not apply route in the morning, at noon, and at bedtime. May substitute to any manufacturer covered by patient's insurance.  Dispense: 1 each; Refill: 0 -     Blood  Glucose Test; 1 each by In Vitro route in the morning, at noon, and at bedtime. May substitute to any manufacturer covered by patient's insurance.  Dispense: 300 strip; Refill: 1 -     Lancet Device; 1 each by Does not apply route in the morning, at noon, and at bedtime. May substitute to any manufacturer covered by patient's insurance.  Dispense: 1 each; Refill: 0 -     Lancets Misc.; 1 each by Does not apply route in the morning, at noon, and at bedtime. May substitute to any manufacturer covered by patient's insurance.  Dispense: 300 each; Refill: 1 -     HIV Antibody (routine testing w rflx) -     Herpes Simplex Virus 1 and 2 (IgG), with Reflex to HSV-2 Inhibition -     Hepatitis C antibody -     RPR -     Rybelsus; Take 1 tablet (7 mg total) by mouth daily. for diabetes.  Dispense: 90 tablet; Refill: 0  Screening for STD (sexually transmitted disease) Assessment & Plan: Labs pending today.  Orders: -     HIV Antibody (routine testing w rflx) -     Herpes Simplex Virus 1 and 2 (IgG), with Reflex to HSV-2 Inhibition -     Hepatitis C antibody -     RPR  Vaginal itching Assessment & Plan: Likely secondary to uncontrolled diabetes induced yeast infection.  Swabs collected today including gonorrhea, chlamydia, trichomonas, wet prep.  Start clobetasol 0.05% cream twice daily as needed x 1 week. Await results.  Working to control diabetes.  Orders: -     Clobetasol Propionate; Apply 1 Application topically 2 (two) times daily.  Dispense: 30 g; Refill: 0 -     C. trachomatis/N. gonorrhoeae RNA -     WET PREP BY MOLECULAR PROBE  Type 2 diabetes mellitus with hyperosmolarity without coma, with long-term  current use of insulin (HCC) -     Basaglar KwikPen; INJDT 30 UNITS UNDER THE SKIN EVERY MORNING AND 25 UNITS EVERY EVENING FOR DIABETES        Doreene Nest, NP

## 2023-01-11 NOTE — Assessment & Plan Note (Signed)
Deteriorated and uncontrolled with A1c of 13.7 today!  Long discussion with patient today regarding compliance and a plan moving forward.  Continue metformin XR 1000 mg daily. Discussed to work on compliance with glipizide.  Continue glipizide XL 10 mg daily.  Continue Basaglar 30 units in the morning, increase Basaglar to 25 units at night.  Strongly advised that she start checking glucose levels.  Prescription for freestyle libre 3+ and glucometer kit sent to pharmacy.  Increase Rybelsus to 7 mg daily.  New prescription sent to pharmacy.  Close follow-up in 1 month.

## 2023-01-11 NOTE — Telephone Encounter (Signed)
Called and scheduled patient appt for today @ 3:20.

## 2023-01-11 NOTE — Addendum Note (Signed)
Addended by: Doreene Nest on: 01/11/2023 04:12 PM   Modules accepted: Orders

## 2023-01-11 NOTE — Patient Instructions (Signed)
Start taking glipizide XL 10 mg every day.  Try not to miss doses.  Continue Basaglar 30 units in the morning, increase to 25 units at night.  Start checking your blood sugars as discussed.  We increased your Rybelsus dose to 7 mg daily.  I sent a new prescription to your pharmacy.  Continue metformin.  Start clobetasol 0.05% cream.  Apply to the skin twice daily x 1 week, then as needed.  Do not apply to your face.  Schedule a follow-up visit for 1 month.  It was a pleasure to see you today!

## 2023-01-11 NOTE — Assessment & Plan Note (Signed)
Likely secondary to uncontrolled diabetes induced yeast infection.  Swabs collected today including gonorrhea, chlamydia, trichomonas, wet prep.  Start clobetasol 0.05% cream twice daily as needed x 1 week. Await results.  Working to control diabetes.

## 2023-01-11 NOTE — Assessment & Plan Note (Signed)
Labs pending today.  

## 2023-01-11 NOTE — Telephone Encounter (Signed)
Noted, will evaluate. 

## 2023-01-12 ENCOUNTER — Other Ambulatory Visit: Payer: Self-pay | Admitting: Primary Care

## 2023-01-12 DIAGNOSIS — B3731 Acute candidiasis of vulva and vagina: Secondary | ICD-10-CM

## 2023-01-12 DIAGNOSIS — B9689 Other specified bacterial agents as the cause of diseases classified elsewhere: Secondary | ICD-10-CM

## 2023-01-12 LAB — WET PREP BY MOLECULAR PROBE
Candida species: DETECTED — AB
MICRO NUMBER:: 15669994
SPECIMEN QUALITY:: ADEQUATE
Trichomonas vaginosis: NOT DETECTED

## 2023-01-12 LAB — C. TRACHOMATIS/N. GONORRHOEAE RNA
C. trachomatis RNA, TMA: NOT DETECTED
N. gonorrhoeae RNA, TMA: NOT DETECTED

## 2023-01-12 MED ORDER — METRONIDAZOLE 500 MG PO TABS: 500.0000 mg | ORAL_TABLET | Freq: Two times a day (BID) | ORAL | 0 refills | Status: AC

## 2023-01-12 MED ORDER — FLUCONAZOLE 150 MG PO TABS: ORAL_TABLET | ORAL | 0 refills | Status: DC

## 2023-01-13 LAB — HEPATITIS C ANTIBODY: Hepatitis C Ab: NONREACTIVE

## 2023-01-13 LAB — HIV ANTIBODY (ROUTINE TESTING W REFLEX): HIV 1&2 Ab, 4th Generation: NONREACTIVE

## 2023-01-13 LAB — HERPES SIMPLEX VIRUS 1 AND 2 (IGG),REFLEX HSV-2 INHIBITION
HSV 1 IGG,TYPE SPECIFIC AB: 25.4 {index} — ABNORMAL HIGH
HSV 2 IGG,TYPE SPECIFIC AB: <0.9 {index}

## 2023-01-13 LAB — RPR: RPR Ser Ql: NONREACTIVE

## 2023-01-26 DIAGNOSIS — E1165 Type 2 diabetes mellitus with hyperglycemia: Secondary | ICD-10-CM

## 2023-01-26 DIAGNOSIS — E039 Hypothyroidism, unspecified: Secondary | ICD-10-CM

## 2023-01-26 DIAGNOSIS — E119 Type 2 diabetes mellitus without complications: Secondary | ICD-10-CM

## 2023-01-26 MED ORDER — LEVOTHYROXINE SODIUM 50 MCG PO TABS: ORAL_TABLET | ORAL | 1 refills | Status: DC

## 2023-01-26 MED ORDER — GLIPIZIDE ER 10 MG PO TB24: 10.0000 mg | ORAL_TABLET | Freq: Every day | ORAL | 0 refills | Status: DC

## 2023-01-29 MED ORDER — METFORMIN HCL ER 500 MG PO TB24: ORAL_TABLET | ORAL | 0 refills | Status: DC

## 2023-03-17 ENCOUNTER — Other Ambulatory Visit: Payer: Self-pay | Admitting: Primary Care

## 2023-03-17 DIAGNOSIS — F411 Generalized anxiety disorder: Secondary | ICD-10-CM

## 2023-03-20 ENCOUNTER — Other Ambulatory Visit: Payer: Self-pay | Admitting: Primary Care

## 2023-03-20 DIAGNOSIS — Z794 Long term (current) use of insulin: Secondary | ICD-10-CM

## 2023-03-20 DIAGNOSIS — N898 Other specified noninflammatory disorders of vagina: Secondary | ICD-10-CM

## 2023-03-20 DIAGNOSIS — E1100 Type 2 diabetes mellitus with hyperosmolarity without nonketotic hyperglycemic-hyperosmolar coma (NKHHC): Secondary | ICD-10-CM

## 2023-03-20 MED ORDER — BASAGLAR KWIKPEN 100 UNIT/ML ~~LOC~~ SOPN: PEN_INJECTOR | SUBCUTANEOUS | 0 refills | Status: DC

## 2023-03-20 MED ORDER — FLUCONAZOLE 150 MG PO TABS: 150.0000 mg | ORAL_TABLET | Freq: Once | ORAL | 0 refills | Status: AC

## 2023-04-05 ENCOUNTER — Ambulatory Visit: Payer: 59 | Admitting: Primary Care

## 2023-04-05 ENCOUNTER — Encounter: Payer: Self-pay | Admitting: Primary Care

## 2023-04-05 VITALS — BP 124/78 | HR 70 | Temp 97.7°F | Ht 62.0 in | Wt 266.0 lb

## 2023-04-05 DIAGNOSIS — Z794 Long term (current) use of insulin: Secondary | ICD-10-CM | POA: Diagnosis not present

## 2023-04-05 DIAGNOSIS — E1165 Type 2 diabetes mellitus with hyperglycemia: Secondary | ICD-10-CM | POA: Diagnosis not present

## 2023-04-05 LAB — POCT GLYCOSYLATED HEMOGLOBIN (HGB A1C): Hemoglobin A1C: 10.8 % — AB (ref 4.0–5.6)

## 2023-04-05 MED ORDER — RYBELSUS 14 MG PO TABS: 14.0000 mg | ORAL_TABLET | Freq: Every day | ORAL | 0 refills | Status: DC

## 2023-04-05 NOTE — Progress Notes (Signed)
Subjective:    Patient ID: Katie [name], female    DOB: 03-02-1991, 33 y.o.   MRN: [id number]  HPI  Zakera Fiddler is a very pleasant 33 y.o. female with a history of uncontrolled type 2 diabetes, hypothyroidism, menorrhagia with regular cycle, vaginitis who presents today for follow-up of diabetes..  Current medications include: Glipizide XL 10 mg daily, metformin ER 1000 mg daily, Rybelsus 7 mg daily, Basaglar 30 units in a.m. and 25 units in p.m.  She stopped her Rybelus for about 3 weeks in November as she became discouraged.  She resumed her Rybelsus 1 month ago.  She is tolerating Rybelsus well.  She would also like to become pregnant next year and is motivated to improve her diabetes so that she can make this happen.  She is checking her blood glucose continuously for the last 1 month.  Time in range for 30 days: 34% Time in range for 14 days: 50% Time in range for 7 days: 92%  Last A1C: 13.7 in October 2024, 10.8 today Last Eye Exam: UTD Last Foot Exam: Due Pneumonia Vaccination: 2020 Urine Microalbumin: UTD Statin: None  Dietary changes since last visit: She would like to become pregnant next year. She has increased her intake of protein. She is working on cutting back on snacking and making better choices.    Exercise: She recently signed up for the gym.       Review of Systems  Eyes:  Negative for visual disturbance.  Respiratory:  Negative for shortness of breath.   Cardiovascular:  Negative for chest pain.  Neurological:  Negative for numbness.         Past Medical History:  Diagnosis Date   Diabetes mellitus without complication (HCC)    Family history of breast cancer    9/20 cancer genetic testing letter sent   History of cesarean section complicating pregnancy 11/15/2018   History of preterm delivery 12/05/2018   Recommend serial cervical length screening starting at 16 weeks Refer for possible cerclage if cervical length < 2.5 cm    Supervision of high risk pregnancy in first trimester 11/15/2018   Clinic [facility] Prenatal Labs Dating  Blood type: B/Positive/-- (09/04 1216)  Genetic Screen 1 Screen:    AFP:     Quad:     NIPS: Antibody:Negative (09/04 1216) Anatomic [place]  Rubella: 5.05 (09/04 1216) Varicella: @VZVIGG @ GTT Early:               Third trimester:  RPR: Non Reactive (09/04 1216)  Rhogam  HBsAg: Negative (09/04 1216)  TDaP vaccine                       Flu Shot: HIV: Non Reactive (   Thyroid disease    Type 2 diabetes mellitus with hyperosmolarity without coma, with long-term current use of insulin (HCC) 08/03/2017    Social History   Socioeconomic History   Marital status: Divorced    Spouse name: Ryheam Research scientist (life sciences) of children: Not on file   Years of education: Not on file   Highest education level: Not on file  Occupational History   Occupation: Nurse, learning disability  Tobacco Use   Smoking status: Never   Smokeless tobacco: Never  Vaping Use   Vaping status: Never Used  Substance and Sexual Activity   Alcohol use: No   Drug use: No   Sexual activity: Yes    Partners: Male    Birth control/protection: None  Other Topics Concern   Not on file  Social History Narrative   Works as a Runner, 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   Enjoys hiking, spending time with family, concerts   Social Drivers of Corporate investment banker Strain: Not on file  Food Insecurity: Not on file  Transportation Needs: Not on file  Physical Activity: Not on file  Stress: Not on file  Social Connections: Not on file  Intimate Partner Violence: Not on file    Past Surgical History:  Procedure Laterality Date   CESAREAN SECTION      Family History  Problem Relation Age of Onset   Diabetes Mother    Hypertension Mother    Hypothyroidism Mother    Diabetes Father    Kidney disease Maternal Grandmother    Leukemia Maternal Grandfather    Diabetes Paternal Grandmother    Breast cancer Maternal Aunt 64        has contact    No Known Allergies  Current Outpatient Medications on File Prior to Visit  Medication Sig Dispense Refill   Blood Glucose Monitoring Suppl DEVI 1 each by Does not apply route in the morning, at noon, and at bedtime. May substitute to any manufacturer covered by patient's insurance. 1 each 0   Continuous Glucose Sensor (FREESTYLE LIBRE 3 PLUS SENSOR) MISC Use to check blood sugar continuously. Change sensor every 15 days. 6 each 1   glipiZIDE (GLUCOTROL XL) 10 MG 24 hr tablet Take 1 tablet (10 mg total) by mouth daily with breakfast. for diabetes. 90 tablet 0   Glucose Blood (BLOOD GLUCOSE TEST STRIPS) STRP 1 each by In Vitro route in the morning, at noon, and at bedtime. May substitute to any manufacturer covered by patient's insurance. 300 strip 1   Insulin Glargine (BASAGLAR KWIKPEN) 100 UNIT/ML INJDT 30 UNITS UNDER THE SKIN EVERY MORNING AND 25 UNITS EVERY EVENING FOR DIABETES 45 mL 0   Lancets Misc. MISC 1 each by Does not apply route in the morning, at noon, and at bedtime. May substitute to any manufacturer covered by patient's insurance. 300 each 1   levothyroxine (SYNTHROID) 50 MCG tablet TAKE 1 TABLET BY MOUTH EVERY MORNING ON AN EMPTY STOMACH WITH WATER ONLY, NO FOOD OR OTHER MEDICATIONS FOR 30 MINUTES 90 tablet 1   metFORMIN (GLUCOPHAGE-XR) 500 MG 24 hr tablet TAKE 2 TABLETS(1000 MG) BY MOUTH DAILY WITH BREAKFAST FOR DIABETES 180 tablet 0   sertraline (ZOLOFT) 50 MG tablet TAKE 1 TABLET(50 MG) BY MOUTH DAILY FOR ANXIETY 90 tablet 0   clobetasol cream (TEMOVATE) 0.05 % Apply 1 Application topically 2 (two) times daily. (Patient not taking: Reported on 04/05/2023) 30 g 0   etonogestrel-ethinyl estradiol (NUVARING) 0.12-0.015 MG/24HR vaginal ring Insert vaginally and leave in place for 3 consecutive weeks, then remove for 1 week. (Patient not taking: Reported on 04/05/2023) 3 each 3   hydrOXYzine (ATARAX) 10 MG tablet Take 1 tablet (10 mg total) by mouth daily as needed (panic  attacks). (Patient not taking: Reported on 04/05/2023) 30 tablet 0   No current facility-administered medications on file prior to visit.    BP 124/78   Pulse 70   Temp 97.7 F (36.5 C) (Temporal)   Ht 5\' 2"  (1.575 m)   Wt 266 lb (120.7 kg)   SpO2 99%   BMI 48.65 kg/m  Objective:   Physical Exam Cardiovascular:     Rate and Rhythm: Normal rate and regular rhythm.  Pulmonary:  Effort: Pulmonary effort is normal.     Breath sounds: Normal breath sounds.  Musculoskeletal:     Cervical back: Neck supple.  Skin:    General: Skin is warm and dry.  Neurological:     Mental Status: She is alert and oriented to person, place, and time.  Psychiatric:        Mood and Affect: Mood normal.           Assessment & Plan:  Uncontrolled type 2 diabetes mellitus with hyperglycemia (HCC) Assessment & Plan: Improving but still above goal with A1c of 10.3 today. We discussed that fertility specialists prefer A1c to be below 6.5.  This is her new goal.  We do long discussion about the importance of medication compliance and getting better control of her diabetes. She was commended on her dietary changes and joining the gym.  Increase Rybelsus to 14 mg daily. Continue Basaglar 30 units in a.m. and 25 units in p.m. Continue glipizide XL 10 mg daily, metformin ER 1000 mg daily.  Foot exam today.  Follow-up in 3 months. She will send glucose readings via MyChart in 2 weeks.   Type 2 diabetes mellitus with hyperglycemia, with long-term current use of insulin (HCC) -     POCT glycosylated hemoglobin (Hb A1C) -     Rybelsus; Take 1 tablet (14 mg total) by mouth daily. for diabetes.  Dispense: 90 tablet; Refill: 0        Doreene Nest, NP

## 2023-04-05 NOTE — Assessment & Plan Note (Addendum)
Improving but still above goal with A1c of 10.3 today. We discussed that fertility specialists prefer A1c to be below 6.5.  This is her new goal.  We do long discussion about the importance of medication compliance and getting better control of her diabetes. She was commended on her dietary changes and joining the gym.  Increase Rybelsus to 14 mg daily. Continue Basaglar 30 units in a.m. and 25 units in p.m. Continue glipizide XL 10 mg daily, metformin ER 1000 mg daily.  Foot exam today.  Follow-up in 3 months. She will send glucose readings via MyChart in 2 weeks.

## 2023-04-17 ENCOUNTER — Ambulatory Visit: Payer: Self-pay | Admitting: Primary Care

## 2023-04-17 NOTE — Telephone Encounter (Signed)
 Copied from CRM [id number]. Topic: Clinical - Red Word Triage >> Apr 17, 2023  8:14 AM Katie [name] wrote: Red Word that prompted transfer to Nurse Triage: Panic attack medication is causing confusion and fogginess. Patient also states she may have a yeast infection.   Chief Complaint: Confusion Symptoms: Fogginess, Disorientation, Sleeping  Frequency: Acute Pertinent Negatives: Patient denies fever, injury, or trauma Disposition: [] ED /[] Urgent Care (no appt availability in office) / [x] Appointment(In office/virtual)/ []  [facility]/ [] Home Care/ [] Refused Recommended Disposition /[] [place] [facility]/ []  Follow-up with PCP Additional Notes: Katie [name] is a 33 year old female being triaged for fogginess and confusion after taking her medication for a panic attack on Sunday. The patient reported taking HydrOXYzine  10 mg on Sunday and reported symptoms since then.    Reason for Disposition  [1] Acting confused (e.g., disoriented, slurred speech) AND [2] brief (now gone)    In office appointment made for tomorrow. Symptoms occurred after medication ingestion.  Answer Assessment - Initial Assessment Questions 1. LEVEL OF CONSCIOUSNESS: How is he (she, the patient) acting right now? (e.g., alert-oriented, confused, lethargic, stuporous, comatose)     Alert-oriented  2. ONSET: When did the confusion start?  (minutes, hours, days)     Sunday Night  3. PATTERN Does this come and go, or has it been constant since it started?  Is it present now?    Since Sunday  4. ALCOHOL or DRUGS: Has he been drinking alcohol or taking any drugs?      No  5. NARCOTIC MEDICINES: Has he been receiving any narcotic medications? (e.g., morphine, Vicodin)     No  6. CAUSE: What do you think is causing the confusion?      Anxiety medication   7. OTHER SYMPTOMS: Are there any other symptoms? (e.g., difficulty breathing, headache, fever, weakness)     No  Protocols used: Confusion -  Delirium-A-AH

## 2023-04-17 NOTE — Telephone Encounter (Signed)
 Noted, will evaluate.

## 2023-04-18 ENCOUNTER — Encounter: Payer: Self-pay | Admitting: Primary Care

## 2023-04-18 ENCOUNTER — Ambulatory Visit: Payer: 59 | Admitting: Primary Care

## 2023-04-18 VITALS — BP 104/76 | HR 75 | Temp 97.1°F | Ht 62.0 in | Wt 260.0 lb

## 2023-04-18 DIAGNOSIS — F411 Generalized anxiety disorder: Secondary | ICD-10-CM

## 2023-04-18 DIAGNOSIS — N898 Other specified noninflammatory disorders of vagina: Secondary | ICD-10-CM

## 2023-04-18 LAB — POC URINALSYSI DIPSTICK (AUTOMATED)
Bilirubin, UA: POSITIVE
Blood, UA: POSITIVE
Glucose, UA: NEGATIVE
Ketones, UA: POSITIVE
Leukocytes, UA: NEGATIVE
Nitrite, UA: NEGATIVE
Protein, UA: POSITIVE — AB
Spec Grav, UA: >=1.03 — AB (ref 1.010–1.025)
Urobilinogen, UA: 0.2 U/dL
pH, UA: 5.5 (ref 5.0–8.0)

## 2023-04-18 MED ORDER — PROPRANOLOL HCL 10 MG PO TABS: 10.0000 mg | ORAL_TABLET | Freq: Every day | ORAL | 0 refills | Status: DC | PRN

## 2023-04-18 MED ORDER — SERTRALINE HCL 50 MG PO TABS: 50.0000 mg | ORAL_TABLET | Freq: Every day | ORAL | 1 refills | Status: DC

## 2023-04-18 NOTE — Assessment & Plan Note (Signed)
 Urinalysis with 3+ blood, is on menstrual cycle now. No glucose, leukocytes, nitrites.  Wet prep collected and pending.

## 2023-04-18 NOTE — Patient Instructions (Addendum)
 We take propranolol , 1 or 2 tablets by mouth as needed for anxiety.  Continue sertraline  50 mg daily.  I sent a new prescription to your pharmacy.  We will be in touch tomorrow with your results.  It was a pleasure to see you today!

## 2023-04-18 NOTE — Progress Notes (Signed)
 Subjective:    Patient ID: Katie [name], female    DOB: Jul 26, 1990, 33 y.o.   MRN: [id number]  Anxiety Symptoms include nervous/anxious behavior.    Vaginal Itching The patient's primary symptoms include vaginal discharge. Pertinent negatives include no abdominal pain, dysuria or frequency.    Katie [name] is a very pleasant 33 y.o. female with a history of uncontrolled type 2 diabetes, hypothyroidism, GAD, who presents today to discuss several concerns.  Her boyfriend is with her today.  1) GAD: Currently managed on sertraline  50 mg daily and hydroxyzine  10 mg PRN. She had a panic attack three days ago, took her hydroxyzine  and felt relief of her anxiety, slept for about 12 hours. Since then she's been unable to concentrate, has low energy, low motivation to do things, tearfulness, feeling unstable. She questions if this is a side effect from hydroxyzine .   Her panic attack was pretty bad, caused a lot of anxiety. Last panic attack was >6 months ago. Prior to this panic attack she was doing well. This was the first time she took hydroxyzine .  She took her last Zoloft  pill yesterday.  She's been out of work all week due to her panic attack. She is needing a work note for this week.   2) Vaginal itching: Acute for the last 1 week with cloudy discharge and vaginal irritation. She denies dysuria, hematuria.   History of bacterial vaginosis and vaginal Candida.  She has not checked her glucose levels recently, just picked up her freestyle libre sensor today.   Review of Systems  Gastrointestinal:  Negative for abdominal pain.  Genitourinary:  Positive for vaginal discharge. Negative for dysuria and frequency.       Vaginal itching  Psychiatric/Behavioral:  The patient is nervous/anxious.          Past Medical History:  Diagnosis Date   Diabetes mellitus without complication (HCC)    Family history of breast cancer    9/20 cancer genetic testing letter sent   History of  cesarean section complicating pregnancy 11/15/2018   History of preterm delivery 12/05/2018   Recommend serial cervical length screening starting at 16 weeks Refer for possible cerclage if cervical length < 2.5 cm   Supervision of high risk pregnancy in first trimester 11/15/2018   Clinic [facility] Prenatal Labs Dating  Blood type: B/Positive/-- (09/04 1216)  Genetic Screen 1 Screen:    AFP:     Quad:     NIPS: Antibody:Negative (09/04 1216) Anatomic [place]   Rubella: 5.05 (09/04 1216) Varicella: @VZVIGG @ GTT Early:               Third trimester:  RPR: Non Reactive (09/04 1216)  Rhogam  HBsAg: Negative (09/04 1216)  TDaP vaccine                       Flu Shot: HIV: Non Reactive (   Thyroid  disease    Type 2 diabetes mellitus with hyperosmolarity without coma, with long-term current use of insulin  (HCC) 08/03/2017    Social History   Socioeconomic History   Marital status: Divorced    Spouse name: Katie [name] Research Scientist (life Sciences) of children: Not on file   Years of education: Not on file   Highest education level: Not on file  Occupational History   Occupation: EC Teacher  Tobacco Use   Smoking status: Never   Smokeless tobacco: Never  Vaping Use   Vaping status: Never Used  Substance and Sexual Activity  Alcohol use: No   Drug use: No   Sexual activity: Yes    Partners: Male    Birth control/protection: None  Other Topics Concern   Not on file  Social History Narrative   Works as a runner, 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   Enjoys hiking, spending time with family, concerts   Social Drivers of Corporate Investment Banker Strain: Not on file  Food Insecurity: Not on file  Transportation Needs: Not on file  Physical Activity: Not on file  Stress: Not on file  Social Connections: Not on file  Intimate Partner Violence: Not on file    Past Surgical History:  Procedure Laterality Date   CESAREAN SECTION      Family History  Problem Relation Age of Onset   Diabetes Mother     Hypertension Mother    Hypothyroidism Mother    Diabetes Father    Kidney disease Maternal Grandmother    Leukemia Maternal Grandfather    Diabetes Paternal Grandmother    Breast cancer Maternal Aunt 31       has contact    No Known Allergies  Current Outpatient Medications on File Prior to Visit  Medication Sig Dispense Refill   Continuous Glucose Sensor (FREESTYLE LIBRE 3 PLUS SENSOR) MISC Use to check blood sugar continuously. Change sensor every 15 days. 6 each 1   glipiZIDE  (GLUCOTROL  XL) 10 MG 24 hr tablet Take 1 tablet (10 mg total) by mouth daily with breakfast. for diabetes. 90 tablet 0   Glucose Blood (BLOOD GLUCOSE TEST STRIPS) STRP 1 each by In Vitro route in the morning, at noon, and at bedtime. May substitute to any manufacturer covered by patient's insurance. 300 strip 1   Insulin  Glargine (BASAGLAR  KWIKPEN) 100 UNIT/ML INJDT 30 UNITS UNDER THE SKIN EVERY MORNING AND 25 UNITS EVERY EVENING FOR DIABETES 45 mL 0   Lancets Misc. MISC 1 each by Does not apply route in the morning, at noon, and at bedtime. May substitute to any manufacturer covered by patient's insurance. 300 each 1   levothyroxine  (SYNTHROID ) 50 MCG tablet TAKE 1 TABLET BY MOUTH EVERY MORNING ON AN EMPTY STOMACH WITH WATER ONLY, NO FOOD OR OTHER MEDICATIONS FOR 30 MINUTES 90 tablet 1   metFORMIN  (GLUCOPHAGE -XR) 500 MG 24 hr tablet TAKE 2 TABLETS(1000 MG) BY MOUTH DAILY WITH BREAKFAST FOR DIABETES 180 tablet 0   Semaglutide  (RYBELSUS ) 14 MG TABS Take 1 tablet (14 mg total) by mouth daily. for diabetes. 90 tablet 0   Blood Glucose Monitoring Suppl DEVI 1 each by Does not apply route in the morning, at noon, and at bedtime. May substitute to any manufacturer covered by patient's insurance. (Patient not taking: Reported on 04/18/2023) 1 each 0   clobetasol  cream (TEMOVATE ) 0.05 % Apply 1 Application topically 2 (two) times daily. (Patient not taking: Reported on 04/18/2023) 30 g 0   etonogestrel -ethinyl estradiol   (NUVARING) 0.12-0.015 MG/24HR vaginal ring Insert vaginally and leave in place for 3 consecutive weeks, then remove for 1 week. (Patient not taking: Reported on 04/18/2023) 3 each 3   No current facility-administered medications on file prior to visit.    BP 104/76   Pulse 75   Temp (!) 97.1 F (36.2 C) (Temporal)   Ht 5' 2 (1.575 m)   Wt 260 lb (117.9 kg)   LMP 04/17/2023   SpO2 98%   Breastfeeding No   BMI 47.55 kg/m  Objective:   Physical Exam Cardiovascular:  Rate and Rhythm: Normal rate and regular rhythm.  Pulmonary:     Effort: Pulmonary effort is normal.     Breath sounds: Normal breath sounds.  Musculoskeletal:     Cervical back: Neck supple.  Skin:    General: Skin is warm and dry.  Neurological:     Mental Status: She is alert and oriented to person, place, and time.  Psychiatric:        Mood and Affect: Mood normal.           Assessment & Plan:  Vaginal itching Assessment & Plan: Urinalysis with 3+ blood, is on menstrual cycle now. No glucose, leukocytes, nitrites.  Wet prep collected and pending.  Orders: -     POCT Urinalysis Dipstick (Automated) -     WET PREP BY MOLECULAR PROBE  GAD (generalized anxiety disorder) Assessment & Plan: Historically controlled. Do not suspect that her residual effects are secondary to hydroxyzine , but she did experience drowsiness for which is a side effect of hydroxyzine .  Continue sertraline  50 mg daily.  Refill provided today. Stop hydroxyzine .  Start propranolol  10 to 20 mg as needed for anxiety attacks.  Agree to provide work note for this week.  Orders: -     Sertraline  HCl; Take 1 tablet (50 mg total) by mouth daily. For anxiety  Dispense: 90 tablet; Refill: 1 -     Propranolol  HCl; Take 1-2 tablets (10-20 mg total) by mouth daily as needed (anxiety).  Dispense: 30 tablet; Refill: 0        Comer MARLA Gaskins, NP

## 2023-04-18 NOTE — Assessment & Plan Note (Signed)
 Historically controlled. Do not suspect that her residual effects are secondary to hydroxyzine , but she did experience drowsiness for which is a side effect of hydroxyzine .  Continue sertraline  50 mg daily.  Refill provided today. Stop hydroxyzine .  Start propranolol  10 to 20 mg as needed for anxiety attacks.  Agree to provide work note for this week.

## 2023-04-19 ENCOUNTER — Other Ambulatory Visit: Payer: Self-pay | Admitting: Primary Care

## 2023-04-19 DIAGNOSIS — N760 Acute vaginitis: Secondary | ICD-10-CM

## 2023-04-19 LAB — WET PREP BY MOLECULAR PROBE
Candida species: NOT DETECTED
MICRO NUMBER:: 16045363
SPECIMEN QUALITY:: ADEQUATE
Trichomonas vaginosis: NOT DETECTED

## 2023-04-19 MED ORDER — METRONIDAZOLE 500 MG PO TABS: 500.0000 mg | ORAL_TABLET | Freq: Two times a day (BID) | ORAL | 0 refills | Status: AC

## 2023-06-05 ENCOUNTER — Other Ambulatory Visit: Payer: Self-pay | Admitting: Primary Care

## 2023-06-05 DIAGNOSIS — E1165 Type 2 diabetes mellitus with hyperglycemia: Secondary | ICD-10-CM

## 2023-06-10 ENCOUNTER — Other Ambulatory Visit: Payer: Self-pay | Admitting: Primary Care

## 2023-06-10 DIAGNOSIS — E119 Type 2 diabetes mellitus without complications: Secondary | ICD-10-CM

## 2023-06-10 DIAGNOSIS — E1100 Type 2 diabetes mellitus with hyperosmolarity without nonketotic hyperglycemic-hyperosmolar coma (NKHHC): Secondary | ICD-10-CM

## 2023-06-19 ENCOUNTER — Ambulatory Visit: Admitting: Primary Care

## 2023-06-19 ENCOUNTER — Ambulatory Visit: Payer: Self-pay

## 2023-06-19 VITALS — BP 126/72 | HR 87 | Temp 97.6°F | Ht 62.0 in | Wt 262.0 lb

## 2023-06-19 DIAGNOSIS — F411 Generalized anxiety disorder: Secondary | ICD-10-CM | POA: Diagnosis not present

## 2023-06-19 DIAGNOSIS — F329 Major depressive disorder, single episode, unspecified: Secondary | ICD-10-CM | POA: Insufficient documentation

## 2023-06-19 DIAGNOSIS — F331 Major depressive disorder, recurrent, moderate: Secondary | ICD-10-CM | POA: Diagnosis not present
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
## 2023-06-19 NOTE — Patient Instructions (Signed)
 You will either be contacted via phone regarding your referral to therapy, or you may receive a letter on your MyChart portal from our referral team with instructions for scheduling an appointment. Please let us know if you have not been contacted by anyone within two weeks.  Continue taking Zoloft 100 mg once daily.  Use the propranolol as needed.  Please send the [facility] paperwork.  Will see you again in 1 week.

## 2023-06-19 NOTE — Assessment & Plan Note (Addendum)
 Deteriorated, secondary to work environment.  Continue Zoloft 100 mg daily for now. Continue propranolol 10 mg as needed.  Referral placed for therapy.  Agree to complete FMLA forms as requested. Start date of 06/20/2023, return date of 10/29/2023. Close follow-up in 1 week.
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
## 2023-06-19 NOTE — Telephone Encounter (Signed)
 Noted, will evaluate.

## 2023-06-19 NOTE — Assessment & Plan Note (Addendum)
 Deteriorated with current working environment.  Continue sertraline 100 mg daily for now.  She declines dose adjustment as she knows that the school/work environment is causing most of her symptoms.   Referral placed for therapy.  Agree to complete FMLA forms, she will attach to MyChart portal. Start date of 06/20/2023, return date of 10/29/2023.  Close follow-up in 1 week.

## 2023-06-23 ENCOUNTER — Telehealth: Admitting: Family

## 2023-06-23 DIAGNOSIS — K0889 Other specified disorders of teeth and supporting structures: Secondary | ICD-10-CM

## 2023-06-23 MED ORDER — AMOXICILLIN-POT CLAVULANATE 875-125 MG PO TABS: 1.0000 | ORAL_TABLET | Freq: Two times a day (BID) | ORAL | 0 refills | Status: DC

## 2023-06-23 NOTE — Progress Notes (Signed)

## 2023-06-27 ENCOUNTER — Ambulatory Visit: Payer: 59 | Admitting: Primary Care

## 2023-07-04 ENCOUNTER — Telehealth: Payer: Self-pay | Admitting: Primary Care

## 2023-07-04 NOTE — Telephone Encounter (Signed)
 Patient dropped off ppwk to be completed by provider. Place in providers box for completion

## 2023-07-04 NOTE — Telephone Encounter (Signed)
 Completed and placed in Kelli's inbox.

## 2023-07-04 NOTE — Telephone Encounter (Signed)
 Faxed, patient notified through mychart.

## 2023-07-11 ENCOUNTER — Ambulatory Visit: Admitting: Primary Care

## 2023-07-11 ENCOUNTER — Encounter: Payer: Self-pay | Admitting: Primary Care

## 2023-07-11 VITALS — BP 114/78 | HR 72 | Temp 98.8°F | Ht 62.0 in | Wt 260.4 lb

## 2023-07-11 DIAGNOSIS — N898 Other specified noninflammatory disorders of vagina: Secondary | ICD-10-CM | POA: Diagnosis not present

## 2023-07-11 DIAGNOSIS — F411 Generalized anxiety disorder: Secondary | ICD-10-CM | POA: Diagnosis not present

## 2023-07-11 DIAGNOSIS — N920 Excessive and frequent menstruation with regular cycle: Secondary | ICD-10-CM

## 2023-07-11 DIAGNOSIS — F331 Major depressive disorder, recurrent, moderate: Secondary | ICD-10-CM

## 2023-07-11 DIAGNOSIS — E1165 Type 2 diabetes mellitus with hyperglycemia: Secondary | ICD-10-CM | POA: Diagnosis not present

## 2023-07-11 DIAGNOSIS — Z794 Long term (current) use of insulin: Secondary | ICD-10-CM | POA: Diagnosis not present

## 2023-07-11 LAB — MICROALBUMIN / CREATININE URINE RATIO
Creatinine,U: 120.5 mg/dL
Microalb Creat Ratio: 14.4 mg/g (ref 0.0–30.0)
Microalb, Ur: 1.7 mg/dL (ref 0.0–1.9)

## 2023-07-11 LAB — POCT GLYCOSYLATED HEMOGLOBIN (HGB A1C): Hemoglobin A1C: 11.3 % — AB (ref 4.0–5.6)

## 2023-07-11 MED ORDER — FLUCONAZOLE 150 MG PO TABS: 150.0000 mg | ORAL_TABLET | Freq: Once | ORAL | 0 refills | Status: AC

## 2023-07-11 MED ORDER — SERTRALINE HCL 100 MG PO TABS
100.0000 mg | ORAL_TABLET | Freq: Every day | ORAL | 0 refills | Status: DC
Start: 2023-07-11 — End: 2023-10-17

## 2023-07-11 MED ORDER — METFORMIN HCL ER 500 MG PO TB24: 1000.0000 mg | ORAL_TABLET | Freq: Two times a day (BID) | ORAL | 1 refills | Status: DC

## 2023-07-11 NOTE — Assessment & Plan Note (Signed)
 Likely antibiotic induced.  Prescription for fluconazole  150 mg tablets sent to pharmacy. She will update if no improvement.
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
## 2023-07-11 NOTE — Assessment & Plan Note (Signed)
 Improved but not at goal.  Discussed options. We decided to increase her Zoloft  to 100 mg daily. She will continue propranolol  10 mg PRN and have her take propranolol  PRN the week prior to menstrual cycle.   Consider changing to Lexapro. She will meet with therapy as scheduled.  Continue to monitor.

## 2023-07-11 NOTE — Patient Instructions (Addendum)
 Stop by the lab prior to leaving today. I will notify you of your results once received.   You will receive a phone call regarding the ultrasound.  Take the fluconazole  yeast pill once.  We increased your dose of Zoloft  to 100 mg daily for anxiety and depression.  Take your propranolol  more frequently 1 week prior to your menstrual cycle.  We increased your metformin  to 2 tablets twice daily for diabetes.  Please start checking your blood sugars as discussed.  Send me blood sugar readings via MyChart in 2 weeks.  Schedule a follow up visit in 1 month for diabetes and anxiety.  It was a pleasure to see you today!

## 2023-07-11 NOTE — Assessment & Plan Note (Signed)
 Pelvic with transvaginal US  ordered and pending to rule out fibroids or other causes.  She agrees.  If ultrasound unremarkable then consider birth control.  Discussed with patient today.  She will think about it.

## 2023-07-11 NOTE — Assessment & Plan Note (Signed)
 Uncontrolled and worse with A1c of 11.3 today.  Checking C-peptide to rule out type 1 diabetes. Again, strongly advised to start checking glucose levels.  Discussed dangers of not taking glucose levels while on insulin .  Clear adhesive dressing provided to patient to use to cover freestyle libre sensor to avoid dislodging.  I am reticent to increase insulin  as she is noncompliant to glucose checks. We discussed SGLT2 inhibitor, but given her recurrent vaginal Candida infections we decided against initiation.  Increase metformin  ER to 1000 mg twice daily. She will send glucose readings via MyChart in 2 weeks.  After that we can adjust her insulin  accordingly.  Follow-up in 1 month with glucose readings.

## 2023-07-12 LAB — C-PEPTIDE: C-Peptide: 2.5 ng/mL (ref 0.80–3.85)

## 2023-07-16 ENCOUNTER — Ambulatory Visit
Admission: RE | Admit: 2023-07-16 | Discharge: 2023-07-16 | Disposition: A | Discharge status: Home / Self Care | Source: Ambulatory Visit | Attending: Primary Care | Admitting: Primary Care

## 2023-07-16 DIAGNOSIS — N920 Excessive and frequent menstruation with regular cycle: Secondary | ICD-10-CM | POA: Diagnosis present

## 2023-07-19 NOTE — Telephone Encounter (Signed)
 I have not received any FMLA through the faxes. The record request was likely sent straight to the medical records department to be released.

## 2023-07-19 NOTE — Telephone Encounter (Signed)
 Kelli, FYI. Not sure if we do anything about their record request? Have you seen any FMLA paperwork recently for this patient?

## 2023-07-31 ENCOUNTER — Ambulatory Visit: Admitting: Clinical

## 2023-10-03 ENCOUNTER — Other Ambulatory Visit: Payer: Self-pay | Admitting: Primary Care

## 2023-10-03 DIAGNOSIS — E1165 Type 2 diabetes mellitus with hyperglycemia: Secondary | ICD-10-CM

## 2023-10-03 DIAGNOSIS — E039 Hypothyroidism, unspecified: Secondary | ICD-10-CM

## 2023-10-03 DIAGNOSIS — R110 Nausea: Secondary | ICD-10-CM

## 2023-10-03 MED ORDER — RYBELSUS 14 MG PO TABS: 14.0000 mg | ORAL_TABLET | Freq: Every day | ORAL | 0 refills | Status: DC

## 2023-10-03 NOTE — Telephone Encounter (Signed)
 Patient is due for CPE/follow up, this will be required prior to any further refills.  Please schedule, thank you!

## 2023-10-08 MED ORDER — ONDANSETRON 4 MG PO TBDP: 4.0000 mg | ORAL_TABLET | Freq: Three times a day (TID) | ORAL | 0 refills | Status: AC | PRN

## 2023-10-17 ENCOUNTER — Other Ambulatory Visit: Payer: Self-pay | Admitting: Primary Care

## 2023-10-17 DIAGNOSIS — E1100 Type 2 diabetes mellitus with hyperosmolarity without nonketotic hyperglycemic-hyperosmolar coma (NKHHC): Secondary | ICD-10-CM

## 2023-10-17 DIAGNOSIS — F411 Generalized anxiety disorder: Secondary | ICD-10-CM

## 2023-10-17 DIAGNOSIS — F331 Major depressive disorder, recurrent, moderate: Secondary | ICD-10-CM

## 2023-10-17 MED ORDER — SERTRALINE HCL 100 MG PO TABS: 100.0000 mg | ORAL_TABLET | Freq: Every day | ORAL | 0 refills | Status: DC

## 2023-10-23 ENCOUNTER — Encounter: Admitting: Primary Care

## 2023-10-24 ENCOUNTER — Encounter: Admitting: Primary Care

## 2023-11-20 DIAGNOSIS — Z794 Long term (current) use of insulin: Secondary | ICD-10-CM

## 2023-11-20 DIAGNOSIS — O99280 Endocrine, nutritional and metabolic diseases complicating pregnancy, unspecified trimester: Secondary | ICD-10-CM

## 2023-11-20 MED ORDER — LEVOTHYROXINE SODIUM 50 MCG PO TABS: ORAL_TABLET | ORAL | 0 refills | Status: DC

## 2023-11-20 MED ORDER — GLIPIZIDE ER 10 MG PO TB24: 10.0000 mg | ORAL_TABLET | Freq: Every day | ORAL | 0 refills | Status: DC

## 2023-12-27 ENCOUNTER — Other Ambulatory Visit: Payer: Self-pay | Admitting: Primary Care

## 2023-12-27 DIAGNOSIS — E1165 Type 2 diabetes mellitus with hyperglycemia: Secondary | ICD-10-CM

## 2024-01-03 ENCOUNTER — Other Ambulatory Visit: Payer: Self-pay | Admitting: Primary Care

## 2024-01-03 DIAGNOSIS — E1100 Type 2 diabetes mellitus with hyperosmolarity without nonketotic hyperglycemic-hyperosmolar coma (NKHHC): Secondary | ICD-10-CM

## 2024-01-03 NOTE — Telephone Encounter (Signed)
Patient is overdue for diabetes follow up, this will be required prior to any further refills.  Please schedule, thank you!

## 2024-01-04 ENCOUNTER — Other Ambulatory Visit: Payer: Self-pay | Admitting: Primary Care

## 2024-01-04 DIAGNOSIS — E039 Hypothyroidism, unspecified: Secondary | ICD-10-CM

## 2024-01-04 DIAGNOSIS — E1165 Type 2 diabetes mellitus with hyperglycemia: Secondary | ICD-10-CM

## 2024-01-04 DIAGNOSIS — E119 Type 2 diabetes mellitus without complications: Secondary | ICD-10-CM

## 2024-01-07 NOTE — Telephone Encounter (Signed)
Lvm to call and schedule

## 2024-01-11 ENCOUNTER — Other Ambulatory Visit: Payer: Self-pay

## 2024-01-11 DIAGNOSIS — E119 Type 2 diabetes mellitus without complications: Secondary | ICD-10-CM

## 2024-01-11 DIAGNOSIS — E1165 Type 2 diabetes mellitus with hyperglycemia: Secondary | ICD-10-CM

## 2024-01-11 DIAGNOSIS — O99280 Endocrine, nutritional and metabolic diseases complicating pregnancy, unspecified trimester: Secondary | ICD-10-CM

## 2024-01-11 MED ORDER — METFORMIN HCL ER 500 MG PO TB24: 1000.0000 mg | ORAL_TABLET | Freq: Two times a day (BID) | ORAL | 1 refills | Status: DC

## 2024-01-11 MED ORDER — GLIPIZIDE ER 10 MG PO TB24: 10.0000 mg | ORAL_TABLET | Freq: Every day | ORAL | 0 refills | Status: DC

## 2024-01-12 MED ORDER — LEVOTHYROXINE SODIUM 50 MCG PO TABS: ORAL_TABLET | ORAL | 0 refills | Status: DC

## 2024-01-12 NOTE — Telephone Encounter (Signed)
 Joellen and Rosina,   A CMA by the name of Curtistine Quiet refilled glipizide  for this patient on 01/11/24. Absolutely no one is allowed to refill my prescriptions unless instructed by me. This patient is very much overdue for a diabetes follow up so this should not have been refilled.   How did this person have authorization to refill my prescriptions? How do we prevent this form happening in the future?  Thanks! Mallie Gaskins, NP-C

## 2024-01-14 NOTE — Telephone Encounter (Signed)
 I think this was sent by patient as my chart messages. This was not sent as refill request. He was asked not to do any my chart for providers that were not in the office. But this was done by mistake.

## 2024-01-21 MED ORDER — GLIPIZIDE ER 10 MG PO TB24: 10.0000 mg | ORAL_TABLET | Freq: Every day | ORAL | 0 refills | Status: DC

## 2024-01-21 MED ORDER — LEVOTHYROXINE SODIUM 50 MCG PO TABS: ORAL_TABLET | ORAL | 0 refills | Status: DC

## 2024-01-21 MED ORDER — RYBELSUS 14 MG PO TABS: 14.0000 mg | ORAL_TABLET | Freq: Every day | ORAL | 0 refills | Status: DC

## 2024-01-21 NOTE — Addendum Note (Signed)
 Addended by: Kadisha Goodine K on: 01/21/2024 05:21 PM   Modules accepted: Orders

## 2024-01-24 ENCOUNTER — Other Ambulatory Visit: Payer: Self-pay | Admitting: Primary Care

## 2024-01-24 DIAGNOSIS — E1100 Type 2 diabetes mellitus with hyperosmolarity without nonketotic hyperglycemic-hyperosmolar coma (NKHHC): Secondary | ICD-10-CM

## 2024-01-26 ENCOUNTER — Other Ambulatory Visit: Payer: Self-pay | Admitting: Primary Care

## 2024-01-26 DIAGNOSIS — E1100 Type 2 diabetes mellitus with hyperosmolarity without nonketotic hyperglycemic-hyperosmolar coma (NKHHC): Secondary | ICD-10-CM

## 2024-02-03 ENCOUNTER — Other Ambulatory Visit: Payer: Self-pay | Admitting: Primary Care

## 2024-02-03 DIAGNOSIS — E1165 Type 2 diabetes mellitus with hyperglycemia: Secondary | ICD-10-CM

## 2024-02-05 ENCOUNTER — Encounter: Payer: Self-pay | Admitting: Family Medicine

## 2024-02-05 ENCOUNTER — Ambulatory Visit: Payer: Self-pay | Admitting: *Deleted

## 2024-02-05 ENCOUNTER — Ambulatory Visit: Admitting: Family Medicine

## 2024-02-05 VITALS — BP 130/82 | HR 105 | Temp 99.0°F | Ht 62.0 in | Wt 252.2 lb

## 2024-02-05 DIAGNOSIS — E1165 Type 2 diabetes mellitus with hyperglycemia: Secondary | ICD-10-CM

## 2024-02-05 DIAGNOSIS — J069 Acute upper respiratory infection, unspecified: Secondary | ICD-10-CM

## 2024-02-05 DIAGNOSIS — R509 Fever, unspecified: Secondary | ICD-10-CM

## 2024-02-05 LAB — POCT INFLUENZA A/B
Influenza A, POC: NEGATIVE
Influenza B, POC: NEGATIVE

## 2024-02-05 LAB — POC COVID19 BINAXNOW: SARS Coronavirus 2 Ag: NEGATIVE

## 2024-02-05 MED ORDER — METFORMIN HCL ER 500 MG PO TB24: 1000.0000 mg | ORAL_TABLET | Freq: Every day | ORAL | Status: DC

## 2024-02-05 NOTE — Progress Notes (Unsigned)
 C/o body aches, nasal congestion/drainage, fatigue and fever- max 101.3. Sxs started 02/03/24. Some throat clearing, some sputum.  Throat is sore.  Some nausea.  No vomiting or diarrhea.  No rash.    Sick contacts at school.    She had only been able to tolerate 1000mg  metformin  daily so far.  Med list updated.    She is going to get another meter to start rechecking her sugar.    Meds, vitals, and allergies reviewed.   ROS: Per HPI unless specifically indicated in ROS section   GEN: nad, alert and oriented HEENT: mucous membranes moist, tm w/o erythema, nasal exam w/o erythema, clear discharge noted,  OP with cobblestoning NECK: supple w/o LA CV: rrr.   PULM: ctab, no inc wob EXT: no edema SKIN: well perfused.   Flu and COVID test negative, discussed with patient at office visit.

## 2024-02-05 NOTE — Telephone Encounter (Signed)
 FYI Only or Action Required?: FYI only for provider: appointment scheduled on 02/05/24.  Patient was last seen in primary care on 07/11/2023 by Gretta Comer POUR, NP.  Called Nurse Triage reporting bodyache (Fever, headache, sore throat).  Symptoms began several days ago.    Symptoms are: gradually worsening.  Triage Disposition: See HCP Within 4 Hours (Or PCP Triage)  Patient/caregiver understands and will follow disposition?: Yes  Copied from CRM #[id number]. Topic: Clinical - Red Word Triage >> Feb 05, 2024  9:09 AM Rea ORN wrote: Red Word that prompted transfer to Nurse Triage: increased mucus, mild coughing, fever, headache, body aches, sore throat for the past few days Reason for Disposition  [1] Fever > 100.0 F (37.8 C) AND [2] diabetes mellitus or weak immune system (e.g., HIV positive, cancer chemo, splenectomy, organ transplant, chronic steroids)  Answer Assessment - Initial Assessment Questions 1. WORST SYMPTOM: What is your worst symptom? (e.g., cough, runny nose, muscle aches, headache, sore throat, fever)      Body aches, fatigue 2. ONSET: When did your flu symptoms start?      Sunday evening 3. COUGH: How bad is the cough?       Mild- only coughing with mucus 4. RESPIRATORY DISTRESS: Describe your breathing.      Normal- nasal congestion 5. FEVER: Do you have a fever? If Yes, ask: What is your temperature, how was it measured, and when did it start?     10 1.3- 9:00, oral 6. EXPOSURE: Were you exposed to someone with influenza?       Patient works in school 7. FLU VACCINE: Did you get a flu shot this year?     no 8. HIGH RISK DISEASE: Do you have any chronic medical problems? (e.g., heart or lung disease, asthma, weak immune system, or other HIGH RISK conditions)     Diabetic  10. OTHER SYMPTOMS: Do you have any other symptoms?  (e.g., runny nose, muscle aches, headache, sore throat)       Sore throat, muscle aches  Protocols used: Influenza  (Flu) - [facility]

## 2024-02-05 NOTE — Telephone Encounter (Signed)
 Noted. Thanks.

## 2024-02-05 NOTE — Patient Instructions (Signed)
 Likely a virus (other than covid and flu) that should gradually get better.  Rest and fluids.  Theraflu if needed.  Update us  as needed.  Take care.  Glad to see you.

## 2024-02-06 DIAGNOSIS — J069 Acute upper respiratory infection, unspecified: Secondary | ICD-10-CM | POA: Insufficient documentation

## 2024-02-06 NOTE — Assessment & Plan Note (Signed)
 Likely a virus (other than covid and flu) that should gradually get better.  Rest and fluids.  Theraflu if needed.  Update us  as needed.  Okay for outpatient f/u. Routine cautions d/w pt.

## 2024-03-09 ENCOUNTER — Telehealth

## 2024-03-09 DIAGNOSIS — K0889 Other specified disorders of teeth and supporting structures: Secondary | ICD-10-CM | POA: Diagnosis not present

## 2024-03-09 DIAGNOSIS — K047 Periapical abscess without sinus: Secondary | ICD-10-CM

## 2024-03-09 MED ORDER — AMOXICILLIN-POT CLAVULANATE 875-125 MG PO TABS: 1.0000 | ORAL_TABLET | Freq: Two times a day (BID) | ORAL | 0 refills | Status: DC

## 2024-03-09 NOTE — Progress Notes (Signed)
 " Virtual Visit Consent   Katie [name], you are scheduled for a virtual visit with a [facility] provider today. Just as with appointments in the office, your consent must be obtained to participate. Your consent will be active for this visit and any virtual visit you may have with one of our providers in the next 365 days. If you have a MyChart account, a copy of this consent can be sent to you electronically.  As this is a virtual visit, video technology does not allow for your provider to perform a traditional examination. This may limit your provider's ability to fully assess your condition. If your provider identifies any concerns that need to be evaluated in person or the need to arrange testing (such as labs, EKG, etc.), we will make arrangements to do so. Although advances in technology are sophisticated, we cannot ensure that it will always work on either your end or our end. If the connection with a video visit is poor, the visit may have to be switched to a telephone visit. With either a video or telephone visit, we are not always able to ensure that we have a secure connection.  By engaging in this virtual visit, you consent to the provision of healthcare and authorize for your insurance to be billed (if applicable) for the services provided during this visit. Depending on your insurance coverage, you may receive a charge related to this service.  I need to obtain your verbal consent now. Are you willing to proceed with your visit today? Katie [name] has provided verbal consent on 03/09/2024 for a virtual visit (video or telephone). Bari Learn, FNP  Date: 03/09/2024 3:48 PM   Virtual Visit via Video Note   I, Bari Learn, connected with  Katie [name]  ([id number], 08/31/1990) on 03/09/2024 at  4:30 PM EST by a video-enabled telemedicine application and verified that I am speaking with the correct person using two identifiers.  Location: Patient: Virtual Visit Location  Patient: Home Provider: Virtual Visit Location Provider: Home Office   I discussed the limitations of evaluation and management by telemedicine and the availability of in person appointments. The patient expressed understanding and agreed to proceed.    History of Present Illness: Katie [name] is a 33 y.o. who identifies as a female who was assigned female at birth, and is being seen today for dental pain of right upper tooth that started three days ago.   Has an appointment with her dentist to have her tooth removed next month. However, her pain is constant aching pain of 8 out 10. She is taking motrin  and using salt water rinses with mild relief.  HPI: HPI  Problems:  Patient Active Problem List   Diagnosis Date Noted   URI (upper respiratory infection) 02/06/2024   MDD (major depressive disorder) 06/19/2023   Perineal itching, female 06/30/2022   Muscle cramping 01/31/2022   Menorrhagia with regular cycle 12/14/2021   GAD (generalized anxiety disorder) 07/20/2021   Paresthesias 01/28/2021   Screening for STD (sexually transmitted disease) 04/13/2020   Otalgia of both ears 03/02/2020   Vaginal itching 07/30/2019   Uncontrolled type 2 diabetes mellitus with hyperglycemia (HCC) 11/15/2018   BMI 45.0-49.9, adult (HCC) 11/15/2018   Hypothyroidism 11/15/2018   Class 3 severe obesity due to excess calories with serious comorbidity and body mass index (BMI) of 45.0 to 49.9 in adult (HCC) 03/01/2018    Allergies: Allergies[1] Medications: Current Medications[2]  Observations/Objective: Patient is well-developed, well-nourished in no acute distress.  Resting  comfortably  at home.  Head is normocephalic, atraumatic.  No labored breathing.  Speech is clear and coherent with logical content.  Patient is alert and oriented at baseline.  Right upper gum swelling and tenderness  Assessment and Plan: 1. Pain, dental (Primary) - amoxicillin -clavulanate (AUGMENTIN ) 875-125 MG tablet;  Take 1 tablet by mouth 2 (two) times daily.  Dispense: 20 tablet; Refill: 0  2. Dental abscess - amoxicillin -clavulanate (AUGMENTIN ) 875-125 MG tablet; Take 1 tablet by mouth 2 (two) times daily.  Dispense: 20 tablet; Refill: 0  Start Augmentin  Rinse mouth after eating  Motrin  as needed Keep follow up with dentist  Follow up if symptoms worsen or do not improve   Follow Up Instructions: I discussed the assessment and treatment plan with the patient. The patient was provided an opportunity to ask questions and all were answered. The patient agreed with the plan and demonstrated an understanding of the instructions.  A copy of instructions were sent to the patient via MyChart unless otherwise noted below.     The patient was advised to call back or seek an in-person evaluation if the symptoms worsen or if the condition fails to improve as anticipated.    Bari Learn, FNP    [1] No Known Allergies [2]  Current Outpatient Medications:    amoxicillin -clavulanate (AUGMENTIN ) 875-125 MG tablet, Take 1 tablet by mouth 2 (two) times daily., Disp: 20 tablet, Rfl: 0   Blood Glucose Monitoring Suppl DEVI, 1 each by Does not apply route in the morning, at noon, and at bedtime. May substitute to any manufacturer covered by patient's insurance., Disp: 1 each, Rfl: 0   clobetasol  cream (TEMOVATE ) 0.05 %, Apply 1 Application topically 2 (two) times daily., Disp: 30 g, Rfl: 0   Continuous Glucose Sensor (FREESTYLE LIBRE 3 PLUS SENSOR) MISC, USE TO CHECK BLOOD SUGAR CONTINUOUSLY. CHANGE SENSOR EVERY 15 DAYS, Disp: 1 each, Rfl: 0   glipiZIDE  (GLUCOTROL  XL) 10 MG 24 hr tablet, Take 1 tablet (10 mg total) by mouth daily with breakfast. for diabetes., Disp: 30 tablet, Rfl: 0   Glucose Blood (BLOOD GLUCOSE TEST STRIPS) STRP, 1 each by In Vitro route in the morning, at noon, and at bedtime. May substitute to any manufacturer covered by patient's insurance., Disp: 300 strip, Rfl: 1   insulin  glargine (LANTUS  SOLOSTAR) 100 UNIT/ML Solostar Pen, INJECT 30 UNITS SUBCUTANEOUSLY IN THE MORNING AND 25 IN THE EVENING for diabetes., Disp: 15 mL, Rfl: 0   Lancets Misc. MISC, 1 each by Does not apply route in the morning, at noon, and at bedtime. May substitute to any manufacturer covered by patient's insurance., Disp: 300 each, Rfl: 1   levothyroxine  (SYNTHROID ) 50 MCG tablet, TAKE 1 TABLET BY MOUTH IN THE MORNING ON  AN  EMPTY  STOMACH  WITH  WATER  ONLY.  NO  FOOD  OR  OTHER  MEDICATIONS  FOR  30  MINUTES, Disp: 30 tablet, Rfl: 0   metFORMIN  (GLUCOPHAGE -XR) 500 MG 24 hr tablet, Take 2 tablets (1,000 mg total) by mouth daily. for diabetes., Disp: , Rfl:    ondansetron  (ZOFRAN -ODT) 4 MG disintegrating tablet, Take 1 tablet (4 mg total) by mouth every 8 (eight) hours as needed for nausea or vomiting., Disp: 10 tablet, Rfl: 0   propranolol  (INDERAL ) 10 MG tablet, Take 1-2 tablets (10-20 mg total) by mouth daily as needed (anxiety)., Disp: 30 tablet, Rfl: 0   Semaglutide  (RYBELSUS ) 14 MG TABS, Take 1 tablet (14 mg total) by mouth daily.  for diabetes., Disp: 30 tablet, Rfl: 0   sertraline  (ZOLOFT ) 100 MG tablet, Take 1 tablet (100 mg total) by mouth daily. for anxiety and depression., Disp: 90 tablet, Rfl: 0  "

## 2024-03-14 ENCOUNTER — Encounter: Payer: Self-pay | Admitting: Primary Care

## 2024-03-14 ENCOUNTER — Ambulatory Visit: Admitting: Primary Care

## 2024-03-14 ENCOUNTER — Ambulatory Visit: Payer: Self-pay | Admitting: Primary Care

## 2024-03-14 VITALS — BP 136/72 | HR 84 | Temp 98.5°F | Ht 62.0 in | Wt 251.4 lb

## 2024-03-14 DIAGNOSIS — F411 Generalized anxiety disorder: Secondary | ICD-10-CM | POA: Diagnosis not present

## 2024-03-14 DIAGNOSIS — Z23 Encounter for immunization: Secondary | ICD-10-CM

## 2024-03-14 DIAGNOSIS — Z0000 Encounter for general adult medical examination without abnormal findings: Secondary | ICD-10-CM | POA: Diagnosis not present

## 2024-03-14 DIAGNOSIS — E559 Vitamin D deficiency, unspecified: Secondary | ICD-10-CM

## 2024-03-14 DIAGNOSIS — E1165 Type 2 diabetes mellitus with hyperglycemia: Secondary | ICD-10-CM | POA: Diagnosis not present

## 2024-03-14 DIAGNOSIS — E1100 Type 2 diabetes mellitus with hyperosmolarity without nonketotic hyperglycemic-hyperosmolar coma (NKHHC): Secondary | ICD-10-CM | POA: Diagnosis not present

## 2024-03-14 DIAGNOSIS — F331 Major depressive disorder, recurrent, moderate: Secondary | ICD-10-CM | POA: Diagnosis not present

## 2024-03-14 DIAGNOSIS — Z794 Long term (current) use of insulin: Secondary | ICD-10-CM | POA: Diagnosis not present

## 2024-03-14 DIAGNOSIS — E039 Hypothyroidism, unspecified: Secondary | ICD-10-CM

## 2024-03-14 DIAGNOSIS — O99280 Endocrine, nutritional and metabolic diseases complicating pregnancy, unspecified trimester: Secondary | ICD-10-CM

## 2024-03-14 LAB — VITAMIN B12: Vitamin B-12: 288 pg/mL (ref 211–911)

## 2024-03-14 LAB — TSH: TSH: 2.58 u[IU]/mL (ref 0.35–5.50)

## 2024-03-14 LAB — LIPID PANEL
Cholesterol: 166 mg/dL (ref 28–200)
HDL: 44.7 mg/dL
LDL Cholesterol: 99 mg/dL (ref 10–99)
NonHDL: 121.09
Total CHOL/HDL Ratio: 4
Triglycerides: 111 mg/dL (ref 10.0–149.0)
VLDL: 22.2 mg/dL (ref 0.0–40.0)

## 2024-03-14 LAB — COMPREHENSIVE METABOLIC PANEL WITH GFR
ALT: 16 U/L (ref 3–35)
AST: 14 U/L (ref 5–37)
Albumin: 4.1 g/dL (ref 3.5–5.2)
Alkaline Phosphatase: 90 U/L (ref 39–117)
BUN: 10 mg/dL (ref 6–23)
CO2: 29 meq/L (ref 19–32)
Calcium: 8.8 mg/dL (ref 8.4–10.5)
Chloride: 100 meq/L (ref 96–112)
Creatinine, Ser: 0.65 mg/dL (ref 0.40–1.20)
GFR: 116.14 mL/min
Glucose, Bld: 221 mg/dL — ABNORMAL HIGH (ref 70–99)
Potassium: 4.3 meq/L (ref 3.5–5.1)
Sodium: 136 meq/L (ref 135–145)
Total Bilirubin: 0.4 mg/dL (ref 0.2–1.2)
Total Protein: 7.4 g/dL (ref 6.0–8.3)

## 2024-03-14 LAB — POCT GLYCOSYLATED HEMOGLOBIN (HGB A1C): Hemoglobin A1C: 8.9 % — AB (ref 4.0–5.6)

## 2024-03-14 LAB — VITAMIN D 25 HYDROXY (VIT D DEFICIENCY, FRACTURES): VITD: 12.64 ng/mL — ABNORMAL LOW (ref 30.00–100.00)

## 2024-03-14 MED ORDER — METFORMIN HCL ER 500 MG PO TB24: 1000.0000 mg | ORAL_TABLET | Freq: Every day | ORAL | 1 refills | Status: AC

## 2024-03-14 MED ORDER — PROPRANOLOL HCL 10 MG PO TABS: 10.0000 mg | ORAL_TABLET | Freq: Every day | ORAL | 0 refills | Status: AC | PRN

## 2024-03-14 MED ORDER — RYBELSUS 14 MG PO TABS: 14.0000 mg | ORAL_TABLET | Freq: Every day | ORAL | 1 refills | Status: AC

## 2024-03-14 MED ORDER — GLIPIZIDE ER 10 MG PO TB24: 10.0000 mg | ORAL_TABLET | Freq: Every day | ORAL | 1 refills | Status: AC

## 2024-03-14 MED ORDER — VITAMIN D (ERGOCALCIFEROL) 1.25 MG (50000 UNIT) PO CAPS: ORAL_CAPSULE | ORAL | 0 refills | Status: AC

## 2024-03-14 MED ORDER — FREESTYLE LIBRE 3 PLUS SENSOR MISC: 1 refills | Status: AC

## 2024-03-14 MED ORDER — SERTRALINE HCL 100 MG PO TABS: 100.0000 mg | ORAL_TABLET | Freq: Every day | ORAL | 3 refills | Status: AC

## 2024-03-14 MED ORDER — LEVOTHYROXINE SODIUM 50 MCG PO TABS: ORAL_TABLET | ORAL | 3 refills | Status: AC

## 2024-03-14 NOTE — Assessment & Plan Note (Signed)
 Improved!  Continue sertraline  100 mg daily, propranolol  10 mg PRN Continue to monitor.

## 2024-03-14 NOTE — Patient Instructions (Signed)
 Stop by the lab prior to leaving today. I will notify you of your results once received.   Resume checking your blood sugar.  Resume your Glipizide  for diabetes.  Please schedule a follow up visit for 3 months.  It was a pleasure to see you today!

## 2024-03-14 NOTE — Addendum Note (Signed)
 Addended by: DELORES NO L on: 03/14/2024 09:57 AM   Modules accepted: Orders

## 2024-03-14 NOTE — Assessment & Plan Note (Signed)
 She is taking correctly.   Continue levothyroxine  50 mcg daily. Repeat TSH pending.

## 2024-03-14 NOTE — Progress Notes (Signed)
 "  Subjective:    Patient ID: Katie [name], female    DOB: 23-Sep-1990, 34 y.o.   MRN: [id number]  Katie [name] is a very pleasant 34 y.o. female with a history of type 2 diabetes, hypothyroidism, hyperlipidemia, GAD who presents today for complete physical and follow up of chronic conditions.  Immunizations: -Tetanus: Completed in 2013 -Influenza: Declines influenza vaccine. -HPV: completed series   Diet: Fair diet.  Exercise: No regular exercise.  Eye exam: Completes annually  Dental exam: Completed > 1 year ago   Pap Smear: Completed in October 2023    BP Readings from Last 3 Encounters:  03/14/24 136/72  02/05/24 130/82  07/11/23 114/78     Review of Systems  Constitutional:  Negative for unexpected weight change.  HENT:  Negative for rhinorrhea.   Respiratory:  Negative for cough and shortness of breath.   Cardiovascular:  Negative for chest pain.  Gastrointestinal:  Negative for constipation and diarrhea.  Genitourinary:  Negative for difficulty urinating and menstrual problem.  Musculoskeletal:  Negative for arthralgias and myalgias.  Skin:  Negative for rash.  Allergic/Immunologic: Negative for environmental allergies.  Neurological:  Negative for dizziness and headaches.  Psychiatric/Behavioral:  The patient is not nervous/anxious.          Past Medical History:  Diagnosis Date   Diabetes mellitus without complication (HCC)    Family history of breast cancer    9/20 cancer genetic testing letter sent   History of cesarean section complicating pregnancy 11/15/2018   History of preterm delivery 12/05/2018   Recommend serial cervical length screening starting at 16 weeks Refer for possible cerclage if cervical length < 2.5 cm   Otalgia of both ears 03/02/2020   Perineal itching, female 06/30/2022   Supervision of high risk pregnancy in first trimester 11/15/2018   [facility] Prenatal Labs Dating  Blood type: B/Positive/-- (09/04 1216)  Genetic  Screen 1 Screen:    AFP:     Quad:     NIPS: Antibody:Negative (09/04 1216) Anatomic US   Rubella: 5.05 (09/04 1216) Varicella: @VZVIGG @ GTT Early:               Third trimester:  RPR: Non Reactive (09/04 1216)  Rhogam  HBsAg: Negative (09/04 1216)  TDaP vaccine                       Flu Shot: HIV: Non Reactive (   Thyroid  disease    Type 2 diabetes mellitus with hyperosmolarity without coma, with long-term current use of insulin  (HCC) 08/03/2017   Vaginal itching 07/30/2019    Social History   Socioeconomic History   Marital status: Divorced    Spouse name: [employer] Research Scientist (life Sciences) of children: Not on file   Years of education: Not on file   Highest education level: Not on file  Occupational History   Occupation: Nurse, Learning Disability  Tobacco Use   Smoking status: Never   Smokeless tobacco: Never  Vaping Use   Vaping status: Never Used  Substance and Sexual Activity   Alcohol use: No   Drug use: No   Sexual activity: Yes    Partners: Male    Birth control/protection: None  Other Topics Concern   Not on file  Social History Narrative   Works as a runner, 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   Enjoys hiking, spending time with family, concerts   Social Drivers of Health   Tobacco Use: Low Risk (03/14/2024)  Patient History    Smoking Tobacco Use: Never    Smokeless Tobacco Use: Never    Passive Exposure: Not on file  Financial Resource Strain: Not on file  Food Insecurity: Not on file  Transportation Needs: Not on file  Physical Activity: Not on file  Stress: Not on file  Social Connections: Not on file  Intimate Partner Violence: Not on file  Depression (PHQ2-9): Low Risk (03/14/2024)   Depression (PHQ2-9)    PHQ-2 Score: 2  Alcohol Screen: Not on file  Housing: Not on file  Utilities: Not on file  Health Literacy: Not on file    Past Surgical History:  Procedure Laterality Date   CESAREAN SECTION      Family History  Problem Relation Age of Onset   Diabetes  Mother    Hypertension Mother    Hypothyroidism Mother    Diabetes Father    Kidney disease Maternal Grandmother    Leukemia Maternal Grandfather    Diabetes Paternal Grandmother    Breast cancer Maternal Aunt 37       has contact    Allergies[1]  Medications Ordered Prior to Encounter[2]  BP 136/72   Pulse 84   Temp 98.5 F (36.9 C) (Oral)   Ht 5' 2 (1.575 m)   Wt 251 lb 6 oz (114 kg)   SpO2 97%   BMI 45.98 kg/m  Objective:   Physical Exam HENT:     Right Ear: Tympanic membrane and ear canal normal.     Left Ear: Tympanic membrane and ear canal normal.  Eyes:     Pupils: Pupils are equal, round, and reactive to light.  Cardiovascular:     Rate and Rhythm: Normal rate and regular rhythm.  Pulmonary:     Effort: Pulmonary effort is normal.     Breath sounds: Normal breath sounds.  Abdominal:     General: Bowel sounds are normal.     Palpations: Abdomen is soft.     Tenderness: There is no abdominal tenderness.  Musculoskeletal:        General: Normal range of motion.     Cervical back: Neck supple.  Skin:    General: Skin is warm and dry.  Neurological:     Mental Status: She is alert and oriented to person, place, and time.     Cranial Nerves: No cranial nerve deficit.     Deep Tendon Reflexes:     Reflex Scores:      Patellar reflexes are 2+ on the right side and 2+ on the left side. Psychiatric:        Mood and Affect: Mood normal.     Physical Exam        Assessment & Plan:  Preventative health care Assessment & Plan: Tdap provided today. Pap smear UTD. Due October 2026  Discussed the importance of a healthy diet and regular exercise in order for weight loss, and to reduce the risk of further co-morbidity.  Exam stable. Labs pending.  Follow up in 1 year for repeat physical.    Type 2 diabetes mellitus with hyperosmolarity without coma, with long-term current use of insulin  (HCC) -     FreeStyle Libre 3 Plus Sensor; Use to check blood  sugar continuously. Change sensor every 15 days.  Dispense: 6 each; Refill: 1 -     POCT glycosylated hemoglobin (Hb A1C)  Hypothyroid in pregnancy, antepartum -     TSH -     Levothyroxine  Sodium; TAKE 1 TABLET BY  MOUTH IN THE MORNING ON  AN  EMPTY  STOMACH  WITH  WATER  ONLY.  NO  FOOD  OR  OTHER  MEDICATIONS  FOR  30  MINUTES  Dispense: 90 tablet; Refill: 3  Uncontrolled type 2 diabetes mellitus with hyperglycemia (HCC) Assessment & Plan: Improved with A1c of 8.9 today!  Continue metformin  ER 1000 mg daily, could not tolerate higher doses, glipizide  XL 10 mg daily, Lantus 30 units in AM and 25 units in PM, Rybelsus  14 mg daily.   Continue to work on diet. Refill provided for [facility] 3 plus.   Follow up in 3 months.    Type 2 diabetes mellitus with hyperglycemia, with long-term current use of insulin  (HCC) Assessment & Plan: Improved with A1c of 8.9 today!  Continue metformin  ER 1000 mg daily, could not tolerate higher doses, glipizide  XL 10 mg daily, Lantus 30 units in AM and 25 units in PM, Rybelsus  14 mg daily.   Continue to work on diet. Refill provided for [facility] 3 plus.   Follow up in 3 months.   Orders: -     Lipid panel -     Comprehensive metabolic panel with GFR -     metFORMIN  HCl ER; Take 2 tablets (1,000 mg total) by mouth daily. for diabetes.  Dispense: 180 tablet; Refill: 1 -     Rybelsus ; Take 1 tablet (14 mg total) by mouth daily. for diabetes.  Dispense: 90 tablet; Refill: 1 -     glipiZIDE  ER; Take 1 tablet (10 mg total) by mouth daily with breakfast. for diabetes.  Dispense: 90 tablet; Refill: 1  GAD (generalized anxiety disorder) Assessment & Plan: Improved!  Continue sertraline  100 mg daily, propranolol  10 mg PRN Continue to monitor.   Orders: -     Sertraline  HCl; Take 1 tablet (100 mg total) by mouth daily. for anxiety and depression.  Dispense: 90 tablet; Refill: 3 -     Propranolol  HCl; Take 1-2 tablets (10-20 mg total) by mouth  daily as needed (anxiety).  Dispense: 30 tablet; Refill: 0  Moderate episode of recurrent major depressive disorder [facility]) Assessment & Plan: Improved!  Continue sertraline  100 mg daily, propranolol  10 mg PRN Continue to monitor.   Orders: -     Sertraline  HCl; Take 1 tablet (100 mg total) by mouth daily. for anxiety and depression.  Dispense: 90 tablet; Refill: 3  Vitamin D  deficiency -     VITAMIN D  25 Hydroxy (Vit-D Deficiency, Fractures) -     Vitamin B12  Hypothyroidism, unspecified type Assessment & Plan: She is taking correctly.   Continue levothyroxine  50 mcg daily. Repeat TSH pending.     Assessment and Plan Assessment & Plan         Comer MARLA Gaskins, NP       [1] No Known Allergies [2]  Current Outpatient Medications on File Prior to Visit  Medication Sig Dispense Refill   Blood Glucose Monitoring Suppl DEVI 1 each by Does not apply route in the morning, at noon, and at bedtime. May substitute to any manufacturer covered by patient's insurance. 1 each 0   Glucose Blood (BLOOD GLUCOSE TEST STRIPS) STRP 1 each by In Vitro route in the morning, at noon, and at bedtime. May substitute to any manufacturer covered by patient's insurance. 300 strip 1   insulin  glargine (LANTUS SOLOSTAR) 100 UNIT/ML Solostar Pen INJECT 30 UNITS SUBCUTANEOUSLY IN THE MORNING AND 25 IN THE EVENING for diabetes. 15 mL 0  Lancets Misc. MISC 1 each by Does not apply route in the morning, at noon, and at bedtime. May substitute to any manufacturer covered by patient's insurance. 300 each 1   ondansetron  (ZOFRAN -ODT) 4 MG disintegrating tablet Take 1 tablet (4 mg total) by mouth every 8 (eight) hours as needed for nausea or vomiting. 10 tablet 0   clobetasol  cream (TEMOVATE ) 0.05 % Apply 1 Application topically 2 (two) times daily. (Patient not taking: Reported on 03/14/2024) 30 g 0   No current facility-administered medications on file prior to visit.   "

## 2024-03-14 NOTE — Assessment & Plan Note (Signed)
 Tdap provided today. Pap smear UTD. Due October 2026  Discussed the importance of a healthy diet and regular exercise in order for weight loss, and to reduce the risk of further co-morbidity.  Exam stable. Labs pending.  Follow up in 1 year for repeat physical.

## 2024-03-14 NOTE — Assessment & Plan Note (Signed)
 Improved with A1c of 8.9 today!  Continue metformin  ER 1000 mg daily, could not tolerate higher doses, glipizide  XL 10 mg daily, Lantus 30 units in AM and 25 units in PM, Rybelsus  14 mg daily.   Continue to work on diet. Refill provided for [facility] 3 plus.   Follow up in 3 months.

## 2024-03-17 DIAGNOSIS — B379 Candidiasis, unspecified: Secondary | ICD-10-CM

## 2024-03-17 LAB — OPHTHALMOLOGY REPORT-SCANNED

## 2024-03-17 MED ORDER — FLUCONAZOLE 150 MG PO TABS: 150.0000 mg | ORAL_TABLET | Freq: Once | ORAL | 0 refills | Status: AC

## 2024-04-05 ENCOUNTER — Other Ambulatory Visit: Payer: Self-pay | Admitting: Primary Care

## 2024-04-05 DIAGNOSIS — E1100 Type 2 diabetes mellitus with hyperosmolarity without nonketotic hyperglycemic-hyperosmolar coma (NKHHC): Secondary | ICD-10-CM
